# Patient Record
Sex: Female | Born: 1935 | Race: White | Hispanic: No | Smoking: Current every day smoker
Health system: Southern US, Community
[De-identification: ages and names within clinical notes are randomized; demographics above are authoritative.]

## PROBLEM LIST (undated history)

## (undated) DIAGNOSIS — J449 Chronic obstructive pulmonary disease, unspecified: Secondary | ICD-10-CM

## (undated) DIAGNOSIS — Z9981 Dependence on supplemental oxygen: Secondary | ICD-10-CM

## (undated) DIAGNOSIS — J9610 Chronic respiratory failure, unspecified whether with hypoxia or hypercapnia: Secondary | ICD-10-CM

## (undated) DIAGNOSIS — M1990 Unspecified osteoarthritis, unspecified site: Secondary | ICD-10-CM

## (undated) DIAGNOSIS — G8929 Other chronic pain: Secondary | ICD-10-CM

## (undated) DIAGNOSIS — E785 Hyperlipidemia, unspecified: Secondary | ICD-10-CM

## (undated) DIAGNOSIS — M797 Fibromyalgia: Secondary | ICD-10-CM

## (undated) DIAGNOSIS — F419 Anxiety disorder, unspecified: Secondary | ICD-10-CM

## (undated) DIAGNOSIS — D3500 Benign neoplasm of unspecified adrenal gland: Secondary | ICD-10-CM

## (undated) DIAGNOSIS — N390 Urinary tract infection, site not specified: Secondary | ICD-10-CM

## (undated) DIAGNOSIS — I972 Postmastectomy lymphedema syndrome: Secondary | ICD-10-CM

## (undated) DIAGNOSIS — C50919 Malignant neoplasm of unspecified site of unspecified female breast: Secondary | ICD-10-CM

## (undated) DIAGNOSIS — F32A Depression, unspecified: Secondary | ICD-10-CM

## (undated) DIAGNOSIS — M549 Dorsalgia, unspecified: Secondary | ICD-10-CM

## (undated) DIAGNOSIS — K219 Gastro-esophageal reflux disease without esophagitis: Secondary | ICD-10-CM

## (undated) DIAGNOSIS — I2510 Atherosclerotic heart disease of native coronary artery without angina pectoris: Secondary | ICD-10-CM

## (undated) DIAGNOSIS — IMO0001 Reserved for inherently not codable concepts without codable children: Secondary | ICD-10-CM

## (undated) DIAGNOSIS — I509 Heart failure, unspecified: Secondary | ICD-10-CM

## (undated) DIAGNOSIS — F329 Major depressive disorder, single episode, unspecified: Secondary | ICD-10-CM

## (undated) DIAGNOSIS — F17200 Nicotine dependence, unspecified, uncomplicated: Secondary | ICD-10-CM

## (undated) HISTORY — PX: BREAST BIOPSY: SHX20

## (undated) HISTORY — DX: Chronic obstructive pulmonary disease, unspecified: J44.9

## (undated) HISTORY — DX: Malignant neoplasm of unspecified site of unspecified female breast: C50.919

## (undated) HISTORY — PX: ABDOMINAL HYSTERECTOMY: SHX81

## (undated) HISTORY — PX: REFRACTIVE SURGERY: SHX103

## (undated) HISTORY — PX: BACK SURGERY: SHX140

## (undated) HISTORY — PX: COLONOSCOPY W/ BIOPSIES AND POLYPECTOMY: SHX1376

## (undated) HISTORY — PX: POSTERIOR LUMBAR FUSION: SHX6036

## (undated) HISTORY — PX: APPENDECTOMY: SHX54

## (undated) HISTORY — PX: SQUAMOUS CELL CARCINOMA EXCISION: SHX2433

---

## 1968-06-25 DIAGNOSIS — C50919 Malignant neoplasm of unspecified site of unspecified female breast: Secondary | ICD-10-CM

## 1968-06-25 HISTORY — PX: MASTECTOMY, RADICAL: SHX710

## 1968-06-25 HISTORY — DX: Malignant neoplasm of unspecified site of unspecified female breast: C50.919

## 1992-06-25 HISTORY — PX: NASAL SINUS SURGERY: SHX719

## 1997-11-29 ENCOUNTER — Ambulatory Visit (HOSPITAL_COMMUNITY): Admission: RE | Admit: 1997-11-29 | Discharge: 1997-11-29 | Payer: Self-pay | Admitting: *Deleted

## 1998-01-19 ENCOUNTER — Ambulatory Visit: Admission: RE | Admit: 1998-01-19 | Discharge: 1998-01-19 | Payer: Self-pay | Admitting: Rheumatology

## 1998-03-23 ENCOUNTER — Ambulatory Visit (HOSPITAL_COMMUNITY): Admission: RE | Admit: 1998-03-23 | Discharge: 1998-03-23 | Payer: Self-pay | Admitting: Orthopedic Surgery

## 1998-05-17 ENCOUNTER — Ambulatory Visit (HOSPITAL_COMMUNITY): Admission: RE | Admit: 1998-05-17 | Discharge: 1998-05-17 | Payer: Self-pay | Admitting: Gastroenterology

## 1998-06-08 ENCOUNTER — Encounter: Payer: Self-pay | Admitting: Neurosurgery

## 1998-06-10 ENCOUNTER — Inpatient Hospital Stay (HOSPITAL_COMMUNITY): Admission: RE | Admit: 1998-06-10 | Discharge: 1998-06-11 | Payer: Self-pay | Admitting: Neurosurgery

## 1999-01-17 ENCOUNTER — Inpatient Hospital Stay (HOSPITAL_COMMUNITY): Admission: AD | Admit: 1999-01-17 | Discharge: 1999-01-19 | Payer: Self-pay | Admitting: General Surgery

## 1999-04-08 ENCOUNTER — Encounter (HOSPITAL_BASED_OUTPATIENT_CLINIC_OR_DEPARTMENT_OTHER): Payer: Self-pay | Admitting: General Surgery

## 1999-04-08 ENCOUNTER — Inpatient Hospital Stay (HOSPITAL_COMMUNITY): Admission: EM | Admit: 1999-04-08 | Discharge: 1999-04-13 | Payer: Self-pay | Admitting: Emergency Medicine

## 1999-07-19 ENCOUNTER — Encounter: Payer: Self-pay | Admitting: Orthopedic Surgery

## 1999-07-19 ENCOUNTER — Ambulatory Visit (HOSPITAL_COMMUNITY): Admission: RE | Admit: 1999-07-19 | Discharge: 1999-07-19 | Payer: Self-pay | Admitting: Orthopedic Surgery

## 1999-07-20 ENCOUNTER — Inpatient Hospital Stay (HOSPITAL_COMMUNITY): Admission: AD | Admit: 1999-07-20 | Discharge: 1999-07-25 | Payer: Self-pay | Admitting: Rheumatology

## 1999-07-20 ENCOUNTER — Encounter: Payer: Self-pay | Admitting: Rheumatology

## 1999-10-11 ENCOUNTER — Encounter: Payer: Self-pay | Admitting: Rheumatology

## 1999-10-11 ENCOUNTER — Inpatient Hospital Stay (HOSPITAL_COMMUNITY): Admission: EM | Admit: 1999-10-11 | Discharge: 1999-10-14 | Payer: Self-pay | Admitting: Emergency Medicine

## 2000-02-20 ENCOUNTER — Encounter: Admission: RE | Admit: 2000-02-20 | Discharge: 2000-02-20 | Payer: Self-pay | Admitting: Internal Medicine

## 2000-02-20 ENCOUNTER — Encounter: Payer: Self-pay | Admitting: Internal Medicine

## 2000-03-29 ENCOUNTER — Encounter: Payer: Self-pay | Admitting: Rheumatology

## 2000-03-29 ENCOUNTER — Encounter: Admission: RE | Admit: 2000-03-29 | Discharge: 2000-03-29 | Payer: Self-pay | Admitting: Rheumatology

## 2000-10-04 ENCOUNTER — Encounter: Admission: RE | Admit: 2000-10-04 | Discharge: 2000-10-04 | Payer: Self-pay | Admitting: Rheumatology

## 2000-10-04 ENCOUNTER — Encounter: Payer: Self-pay | Admitting: Rheumatology

## 2000-11-05 ENCOUNTER — Ambulatory Visit (HOSPITAL_COMMUNITY): Admission: RE | Admit: 2000-11-05 | Discharge: 2000-11-05 | Payer: Self-pay | Admitting: Gastroenterology

## 2001-02-07 ENCOUNTER — Other Ambulatory Visit: Admission: RE | Admit: 2001-02-07 | Discharge: 2001-02-07 | Payer: Self-pay | Admitting: Rheumatology

## 2001-04-18 ENCOUNTER — Encounter: Admission: RE | Admit: 2001-04-18 | Discharge: 2001-04-18 | Payer: Self-pay | Admitting: Rheumatology

## 2001-04-18 ENCOUNTER — Encounter: Payer: Self-pay | Admitting: Rheumatology

## 2001-10-23 ENCOUNTER — Observation Stay (HOSPITAL_COMMUNITY): Admission: RE | Admit: 2001-10-23 | Discharge: 2001-10-24 | Payer: Self-pay | Admitting: Orthopedic Surgery

## 2002-05-08 ENCOUNTER — Encounter: Admission: RE | Admit: 2002-05-08 | Discharge: 2002-05-08 | Payer: Self-pay | Admitting: Rheumatology

## 2002-05-08 ENCOUNTER — Encounter: Payer: Self-pay | Admitting: Rheumatology

## 2002-12-25 ENCOUNTER — Ambulatory Visit (HOSPITAL_COMMUNITY): Admission: RE | Admit: 2002-12-25 | Discharge: 2002-12-25 | Payer: Self-pay | Admitting: Gastroenterology

## 2002-12-25 ENCOUNTER — Encounter (INDEPENDENT_AMBULATORY_CARE_PROVIDER_SITE_OTHER): Payer: Self-pay | Admitting: *Deleted

## 2003-02-17 ENCOUNTER — Encounter: Admission: RE | Admit: 2003-02-17 | Discharge: 2003-02-17 | Payer: Self-pay | Admitting: Rheumatology

## 2003-02-17 ENCOUNTER — Encounter: Payer: Self-pay | Admitting: Rheumatology

## 2003-04-13 ENCOUNTER — Encounter: Payer: Self-pay | Admitting: Internal Medicine

## 2003-04-13 ENCOUNTER — Inpatient Hospital Stay (HOSPITAL_COMMUNITY): Admission: AD | Admit: 2003-04-13 | Discharge: 2003-04-20 | Payer: Self-pay | Admitting: Internal Medicine

## 2003-04-15 ENCOUNTER — Encounter: Payer: Self-pay | Admitting: Internal Medicine

## 2003-04-16 ENCOUNTER — Encounter: Payer: Self-pay | Admitting: Internal Medicine

## 2003-04-19 ENCOUNTER — Encounter: Payer: Self-pay | Admitting: Specialist

## 2003-05-31 ENCOUNTER — Encounter: Admission: RE | Admit: 2003-05-31 | Discharge: 2003-05-31 | Payer: Self-pay | Admitting: Rheumatology

## 2003-06-14 ENCOUNTER — Encounter: Admission: RE | Admit: 2003-06-14 | Discharge: 2003-06-14 | Payer: Self-pay | Admitting: Rheumatology

## 2003-06-16 ENCOUNTER — Encounter: Admission: RE | Admit: 2003-06-16 | Discharge: 2003-06-16 | Payer: Self-pay | Admitting: Rheumatology

## 2004-06-23 ENCOUNTER — Encounter: Admission: RE | Admit: 2004-06-23 | Discharge: 2004-06-23 | Payer: Self-pay | Admitting: Rheumatology

## 2004-07-07 ENCOUNTER — Encounter: Admission: RE | Admit: 2004-07-07 | Discharge: 2004-07-07 | Payer: Self-pay | Admitting: Rheumatology

## 2004-08-25 ENCOUNTER — Ambulatory Visit (HOSPITAL_BASED_OUTPATIENT_CLINIC_OR_DEPARTMENT_OTHER): Admission: RE | Admit: 2004-08-25 | Discharge: 2004-08-25 | Payer: Self-pay | Admitting: Urology

## 2005-05-30 ENCOUNTER — Ambulatory Visit: Payer: Self-pay | Admitting: Internal Medicine

## 2005-05-30 ENCOUNTER — Inpatient Hospital Stay (HOSPITAL_COMMUNITY): Admission: AD | Admit: 2005-05-30 | Discharge: 2005-06-05 | Payer: Self-pay | Admitting: General Surgery

## 2005-10-19 ENCOUNTER — Encounter: Admission: RE | Admit: 2005-10-19 | Discharge: 2005-10-19 | Payer: Self-pay | Admitting: Rheumatology

## 2005-11-06 ENCOUNTER — Encounter: Admission: RE | Admit: 2005-11-06 | Discharge: 2005-11-06 | Payer: Self-pay | Admitting: Rheumatology

## 2006-03-01 ENCOUNTER — Encounter: Admission: RE | Admit: 2006-03-01 | Discharge: 2006-03-01 | Payer: Self-pay | Admitting: Rheumatology

## 2006-04-29 ENCOUNTER — Encounter: Admission: RE | Admit: 2006-04-29 | Discharge: 2006-04-29 | Payer: Self-pay | Admitting: Rheumatology

## 2006-05-07 ENCOUNTER — Ambulatory Visit (HOSPITAL_COMMUNITY): Admission: RE | Admit: 2006-05-07 | Discharge: 2006-05-07 | Payer: Self-pay | Admitting: General Surgery

## 2006-05-07 ENCOUNTER — Encounter: Admission: RE | Admit: 2006-05-07 | Discharge: 2006-05-07 | Payer: Self-pay | Admitting: General Surgery

## 2006-05-13 ENCOUNTER — Encounter: Admission: RE | Admit: 2006-05-13 | Discharge: 2006-05-13 | Payer: Self-pay | Admitting: General Surgery

## 2006-05-13 ENCOUNTER — Encounter (INDEPENDENT_AMBULATORY_CARE_PROVIDER_SITE_OTHER): Payer: Self-pay | Admitting: *Deleted

## 2006-12-06 ENCOUNTER — Encounter: Admission: RE | Admit: 2006-12-06 | Discharge: 2006-12-06 | Payer: Self-pay | Admitting: General Surgery

## 2007-06-02 ENCOUNTER — Encounter: Admission: RE | Admit: 2007-06-02 | Discharge: 2007-06-02 | Payer: Self-pay | Admitting: Rheumatology

## 2007-06-30 ENCOUNTER — Encounter: Admission: RE | Admit: 2007-06-30 | Discharge: 2007-06-30 | Payer: Self-pay | Admitting: General Surgery

## 2007-10-15 ENCOUNTER — Ambulatory Visit: Payer: Self-pay | Admitting: Vascular Surgery

## 2007-10-16 ENCOUNTER — Ambulatory Visit (HOSPITAL_COMMUNITY): Admission: RE | Admit: 2007-10-16 | Discharge: 2007-10-16 | Payer: Self-pay | Admitting: Orthopedic Surgery

## 2007-12-05 ENCOUNTER — Encounter: Admission: RE | Admit: 2007-12-05 | Discharge: 2007-12-05 | Payer: Self-pay | Admitting: Rheumatology

## 2009-02-18 ENCOUNTER — Encounter: Admission: RE | Admit: 2009-02-18 | Discharge: 2009-02-18 | Payer: Self-pay | Admitting: Rheumatology

## 2010-03-31 ENCOUNTER — Encounter: Admission: RE | Admit: 2010-03-31 | Discharge: 2010-03-31 | Payer: Self-pay | Admitting: Internal Medicine

## 2010-05-05 ENCOUNTER — Encounter: Admission: RE | Admit: 2010-05-05 | Discharge: 2010-05-05 | Payer: Self-pay | Admitting: Internal Medicine

## 2010-07-15 ENCOUNTER — Encounter: Payer: Self-pay | Admitting: Internal Medicine

## 2010-07-16 ENCOUNTER — Encounter: Payer: Self-pay | Admitting: Rheumatology

## 2010-11-10 NOTE — Discharge Summary (Signed)
[place]. [facility]  Patient:    Theresa Blackwell                        MRN: [id number] Adm. Date:  [id number] Disc. Date: [id number] Attending:  Genene Churn Dictator:   Demetria Pore. Coral Spikes, M.D. CC:         Marnee Spring. Wiliam Ke, M.D.             Ronald A. Darrelyn Hillock, M.D.             Adelene Amas. Williford, M.D.             Christella Hartigan, M.D./Alan Marin Comment, MD                           Discharge Summary  DISCHARGE DIAGNOSIS: 1.  Cellulitis, right arm (on background lymphedema, status post breast cancer     surgery). 2.  Status post thrombocytopenia, ? etiology; rule out infection. 3.  Chest x-ray atelectasis. 4.  Nausea, ? etiology; improved.  DISCHARGE MEDICATIONS: 1.  Keflex 500 mg q.i.d. for one week. 2.  Ritalin 20 mg 1 q.d. 3.  Doxepin 500 mg in the morning; Doxepin 200 mg at sleep (two 75 mg tablets     and one 50 mg tablet per patient). 4.  Xanax 0.5 mg 1/2 tablet t.i.d. as-needed for anxiety. 5.  Tylenol #3 one q.i.d. as-needed for back pain. 6.  Centrum Silver 1 q.d. 7.  Calcium 600 mg 1 b.i.d. 8.  Artificial Tears as-needed to eyes.  ACTIVITY:  Ambulate as tolerated.  DIET:  No restrictions.  SPECIAL INSTRUCTIONS:  Elevate right arm.  DISPOSITION:  Follow-up with Dr. Coral Spikes, appointment in one week; call for an appointment.  Also follow up with Dr. Darrelyn Hillock for her back pain.  HISTORY OF PRESENT ILLNESS:  The patient is a 77 year old right handed white female who was admitted with cellulitis of the right arm in a patient with lymphedema secondary to right mastectomy right arm for further evaluation and treatment. ee the dictated History and Physical exam for details.  LABORATORY DATA:  Blood cultures so far no growth (x three).  On July 20, 1999 WBC was 9100, hemoglobin 12.6, platelet count 170,000.  On July 22, 1999 WBC was 5500, hemoglobin 11.4, platelet count 116,000.  On July 24, 1999 platelet count was 167,000, WBC  4000, hemoglobin 11.2.  On July 20, 1999 80% segs.  On July 20, 1999 CMET remarkable for sodium 134,  albumin 3.4.  Chest x-ray July 20, 1999 showed mild patchy atelectasis at the left base with mild elevation of the left hemidiaphragm, otherwise no significant change from June 08, 1998.  EKG showed normal sinus rhythm with left axis deviation, nonspecific ST-T change; premature atrial contractions, resolved per  cardiologist.  HOSPITAL COURSE: #1 - CELLULITIS, RIGHT ARM:  The patient was admitted with cellulitis of the right arm clinically with erythema of the right arm and swelling (more than her lymphedema) and tenderness.  She was afebrile.  She was put on IV Ancef.  The patient was also seen by Dr. Wiliam Ke with cellulitis in the hospital.  The patient remained on IV Ancef through July 24, 1999.  On July 24, 1999 the erythema was not resolved, the swelling was decreased, and there was no pain in the right arm.  She remained with elevation of the right arm.  The patient was switched to  Keflex 500 mg q.i.d. and if remains afebrile will be discharged.  The patient was ambulatory.  #2 - THROMBOCYTOPENIA:  On admission the patient had thrombocytopenia as outlined above.  Repeat platelet count on July 24, 1999 was normal, ? thrombocytopenia secondary to infection (patients hemoglobin initially normal but dropped to 11 over 24 hours; would suspect secondary to infection).  #3 - CHEST X-RAY ATELECTASIS:  Chest x-ray done on admission showed atelectasis. There was no shortness of breath.  The patient did deep breathing and was asymptomatic.  The lungs were clear on July 24, 1999.  #4 - NAUSEA, ? ETIOLOGY:  The patient had nausea on July 23, 1999.  The patient states she was given Phenergan and was better and no recurrence.  Was not nauseated on July 24, 1999.  Abdomen benign.  Bowel sounds normoactive.  ? etiology.  #5 - BACK PAIN:   The patient  was in the process of seeing Dr. Darrelyn Hillock for back  pain at the time she was admitted for cellulitis.  Will have her follow up with Dr. Darrelyn Hillock as an outpatient.  As noted, on July 12, 1999 she was having low back pain.  X-rays of the back showed severe degenerative disk disease of her back. It is very difficult to tell if she really has a true fusion.  She has a history of breast cancer.  He is going to get a bone scan (bone scan in the computer focal  degenerative change of the lumbar spine L3-4, no metastatic disease).  The patient is going to follow up with Dr. Darrelyn Hillock.  She is on Tylenol #3 for her back pain.  PLAN ON DISCHARGE: 1.  Cellulitis.     a.  Elevate right arm.     b.  Keflex 500 mg q.i.d. for a week.     c.  To follow up with Dr. Coral Spikes in one week. 2.  Status post thrombocytopenia, ? secondary to infection.     a.  No further work-up. 3.  Atelectasis on chest x-ray, not clinically symptomatic.     a.  Patient ambulatory, no further treatment. 4.  Status post nausea, ? etiology.     a.  Nausea resolved, no treatment. 5.  Chronic low back pain, status post bilateral fusion L4 to sacrum.     a.  Tylenol #3 1 q.i.d.     b.  Follow-up with Dr. Darrelyn Hillock. 6.  Depression.     a.  Ritalin 20 mg q.d.     b.  Doxepin 200 mg evening and 50 mg morning.     c.  Xanax 1/2 of a 0.5 mg tablet t.i.d. p.r.n. anxiety.     d.  Follow-up with Dr. Jeanie Sewer.  DISCHARGE CONDITION:  Improved.DD:  07/24/99 TD:  07/24/99 Job: [id number]

## 2010-11-10 NOTE — H&P (Signed)
Theresa Blackwell, Theresa Blackwell                ACCOUNT NO.:  [id number]   MEDICAL RECORD NO.:  [id number]          PATIENT TYPE:   LOCATION:                                 FACILITY:   PHYSICIAN:  Rose Phi. Young, M.D.        DATE OF BIRTH:   DATE OF ADMISSION:  05/30/2005  DATE OF DISCHARGE:                                HISTORY & PHYSICAL   This 77 year old white female underwent a right radical mastectomy for stage  I carcinoma of the breast with postoperative chest wall radiation in the  early 1970s.  Subsequent to that she has really done well with some  lymphedema over the years.  She was last seen in our office with a  questionable lipoma in her left axilla back in May of 2006.   About a week or so ago after pricking her finger she developed more swelling  and some erythema of her right arm.  This turned into a severe cellulitis  and she was originally treated with Levaquin by mouth and then I saw her in  the office 48 hours ago.  She is quite erythematous from the upper arm to  the wrist and I switched to her Cephalexin 500 q.i.d. with no resolution.  I  am now admitting her now for IV antibiotics.   FAMILY HISTORY:  Father died of heart disease and a diseased gallbladder.  Mother died of older age.  Has one brother living and three who have died,  one of cancer.  She has two sisters living and three who have died.   CURRENT MEDICATIONS:  Doxepin, Tylenol No.3, Xanax, albuterol, and Allegra.   ALLERGIES:  ERYTHROMYCIN.   PAST SURGICAL HISTORY:  Radical mastectomy done some more than 30 years ago.   SOCIAL HISTORY:  Does not use tobacco or alcohol.   REVIEW OF SYSTEMS:  All 15 points were documented in the chart within normal  limits.   PHYSICAL FINDINGS:  GENERAL:  Theresa Blackwell is a very pleasant 77 year old  well-nourished, well-developed female in no distress.  She is oriented to  time, place, and date.  HEENT:  She is normocephalic.  EOMs are normal.  There is no  palpable,  cervical, or axillary nodes.  LUNGS:  Clear to percussion, auscultation.  HEART:  Normal sinus rhythm without murmurs.  LUNGS:  Clear to auscultation.  ABDOMEN:  Soft without masses, tenderness, or organomegaly.  SKIN:  With the extremities shows extreme erythema with the wrists to the  upper arm and does not extend to the chest wall.   IMPRESSION:  Significant cellulitis in a lymphedematous arm secondary to  previous breast cancer surgery.  She has failed as an outpatient on oral  antibiotics and we are admitting her for IV antibiotics.      Rose Phi. Maple Hudson, M.D.  Electronically Signed     PRY/MEDQ  D:  05/30/2005  T:  05/30/2005  Job:  [id number]

## 2010-11-10 NOTE — H&P (Signed)
NAME:  Theresa Blackwell, Theresa Blackwell                          ACCOUNT NO.:  [id number]   MEDICAL RECORD NO.:  [id number]                   PATIENT TYPE:  INP   LOCATION:  5150                                 FACILITY:  [facility]   PHYSICIAN:  Lennox Pippins, M.D.                 DATE OF BIRTH:  [birth year]   DATE OF ADMISSION:  04/13/2003  DATE OF DISCHARGE:                                HISTORY & PHYSICAL   CHIEF COMPLAINT:  Right arm infection.   HISTORY OF PRESENT ILLNESS:  Theresa Blackwell is a very pleasant 77 year old  white female patient of Dr. Katina Degree who presents to the office with a  right arm cellulitis. She states she has had lower arm swelling since she  was treated for cellulitis on February 18, 2003.  She was treated with  Levaquin at that time.  The redness resolved.  She did not however have  resolution of the pain in the lower right arm.  She also continued to have  some swelling.  In the last few days this has gotten much worse.  She  awakened this morning with chills and sweats and noticed that she had  redness all the way up her arm.  The patient is status post right breast  carcinoma with COMLA treatment and subsequent lymphedema in 1972.   REVIEW OF SYSTEMS:  GENERAL:  Fatigue.  She took her temperature this  morning but fever did not register on the thermometer.  HEENT:  No  complaints.  CHEST:  No cough or shortness of breath.  MUSCULOSKELETAL:  As  above, she has had some right lower back discomfort going straight through  to the right lower abdomen on and off for several days.   PAST MEDICAL HISTORY:  1. Breast cancer with a mastectomy in 1970.  2. Recurrent cellulitis right arm, following mastectomy.  3. Hyperlipidemia.  4. Hypercalcemia.  5. Smoking abuse.  6. Fibromyalgia.  7. Degenerative joint disease/chronic pain.  8. Depression, followed by Dr. Jeanie Sewer.   PAST SURGICAL HISTORY:  1. Mastectomy as above.  2. Schwannoma from neck, Dr. Elesa Hacker, December, 1999.  3.  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  4. Status post edematous polyp removed from the colon.  5. Sinus surgery, 1994, Dr. Nedra Hai.  6. Foot surgery, left.  7. Bilateral effusion of the back.  8. Total abdominal hysterectomy, BSO, appendectomy.   SOCIAL HISTORY:  She smokes one pack of cigarettes a day and has for 40  years.  No ETOH.   FAMILY HISTORY:  Colon cancer present in two brothers.  There is also a  family history for hypertension, kidney stone and arthritis.    MEDICATIONS:  1. Doxepin 50 mg one q.a.m., 200 mg h.s.  2. Tylenol #3 1-2 p.o. q.i.d. p.r.n.  3. Ritalin LA 30 mg one daily.  4. Multivitamin one daily.  5. Caltrate with  vitamin D one daily.   PHYSICAL EXAMINATION:  VITAL SIGNS:  Temperature 100.8, pulse 70,  respirations 20, blood pressure 140/88.  GENERAL:  She is pleasant, non toxic appearing.  HEENT:  Eyes - pupils are equal, round and reactive to light and  accommodation.  Ear, nose and throat unremarkable.  NECK:  No jugular venous distension.  No carotid bruits.  HEART:  Regular rate and rhythm without murmur.  CHEST:  Breath sounds are clear throughout. She has slight chest wall  redness that is warm to touch.  ABDOMEN:  Nontender.  MUSCULOSKELETAL:  No back tenderness.  There is right arm redness and  swelling all the way up the arm to just below the shoulder.  There is also  slight redness in the right chest, as above.  Excellent radial pulses.  NEUROLOGIC:  Nonfocal.   IMPRESSION:  Right arm cellulitis secondary to lymphedema from mastectomy.   PLAN:  Admit, intravenous antibiotics after blood cultures drawn.  Pain and  antiemetic medication.  Follow up per attending.        Tamera C. Kendal Hymen, M.D.    TCL/MEDQ  D:  04/13/2003  T:  04/13/2003  Job:  [id number]

## 2010-11-10 NOTE — Discharge Summary (Signed)
[place]. [facility]  Patient:    Theresa Blackwell, Theresa Blackwell                       MRN: [id number] Adm. Date:  [id number] Disc. Date: 10/14/99 Attending:  Genene Churn CC:         Marnee Spring. Wiliam Ke, M.D.             Ronald A. Darrelyn Hillock, M.D.             Adelene Amas. Williford, M.D.             Christella Hartigan, M.D.                           Discharge Summary  DISCHARGE DIAGNOSES:  Cellulitis, right arm.  DISCHARGE MEDICATIONS: 1. Keflex 500 mg four times a day for seven days (for a total of 10 days of    antibiotics). 2. Doxepin 50 mg in the morning and 200 mg at night. 3. Ritalin 20 mg b.i.d. 4. Xanax 0.5 mg one-half tablet t.i.d. p.r.n. anxiety. 5. Tylenol No. 3 one four times a day if needed for back pain. 6. Centrum Silver once a day. 7. Calcium 600 mg with vitamin D one twice a day. 8. Artificial Tears as needed.  DIET:  No restrictions.  ACTIVITY:  Ambulate as tolerated.  SPECIAL INSTRUCTIONS:  Outpatient will arrange physical therapy to learn to wrap right arm that has lymphedema.  FOLLOW-UP:  Follow up with Dr. Coral Spikes in 10 to 14 days.  Call for an appointment.  HISTORY OF PRESENT ILLNESS:  The patient is a 77 year old right-handed white female admitted with cellulitis of the right arm for further evaluation and treatment.  This is on a background of status post right radical mastectomy and cobalt therapy with subsequent lymphedema of the right arm and recurrent cellulitis episodes.  See dictated history and physical exam for details.  LABORATORY DATA:  Negative for interval change compared to July 20, 1999. CMET remarkable for sodium 146.  White count 9300, hemoglobin 13.6, platelet count 151,000, and 81% segs.  Blood culture no growth x 2 days (two blood cultures).  Urinalysis - 0 to 5 white and 0 to 5 red cells per high-power field.  HOSPITAL COURSE: #1 - CELLULITIS, RIGHT ARM:  Patient was admitted and put on IV antibiotics because of  cellulitis of the right arm clinically.  In a previous admission in January, she had failed outpatient treatment initially with Keflex. Initially, erythema persisted but eventually started to fade.  She had some discomfort in the right forearm which improved.  The patient remained afebrile.  Blood cultures no growth.  I elected to treat the patient with antibiotics for three days with IV Ancef.  If patient remains afebrile and erythema continues to improve, will switch to oral antibiotic on October 14, 1999, and discharge.  Consulted physical therapy with respect to lymphedema, right arm, for consideration of wrapping.  With cellulitis, it was too early to consider wrapping.  I will pursue this as an outpatient and discuss with patient.  PLAN ON DISCHARGE: 1. Keflex 500 mg four times a day for seven days for a total of 10 days of    antibiotics. 2. Elevate right arm. 3. Consider outpatient physical therapy to learn to wrap right arm Zella Ball    [place], [phone]).  #2 - BACK PAIN:  Continue Tylenol No 3. one q.i.d.  #3 -  DEPRESSION:  Continue Ritalin 20 mg b.i.d., Doxepin 50 mg in the morning and 200 mg in the evening, and Xanax one-half of 0.5 mg tablet t.i.d., and follow up with Dr. Jeanie Sewer.  #4 - DRY EYES:  Artificial Tears.  Follow up with Dr. ______  #5 - RIGHT UPPER LOBE LESION:  Follow up with Dr. Clydie Braun.  #6 - STATUS POST COLON POLYP:  Patient will need colonoscopy in 2001, arrange as an outpatient. DD:  10/13/99 TD:  10/15/99 Job: [id number]

## 2010-11-10 NOTE — Op Note (Signed)
[name], Theresa Blackwell                ACCOUNT NO.:  [id number]   MEDICAL RECORD NO.:  [id number]          PATIENT TYPE:  AMB   LOCATION:  [facility]                         [facility]AC[facility]   PHYSICIAN:  Lindaann Slough, M.D.  DATE OF BIRTH:  [birth year]   DATE OF PROCEDURE:  08/25/2004  DATE OF DISCHARGE:                                 OPERATIVE REPORT   PREOPERATIVE DIAGNOSIS:  Meatal stenosis.   POSTOPERATIVE DIAGNOSIS:  Meatal stenosis plus urethral caruncle   PROCEDURE PERFORMED:  Cystoscopy, urethral dilation.   SURGEON:  Danae Chen, M.D.   ANESTHESIA:  General.   DATE OF PROCEDURE:  08/25/2004   INDICATIONS:  The patient is a 77 year old female who has been complaining  of frequency, hesitancy, straining on urination.  She was treated with  antibiotics without any improvement.  She is scheduled today for cystoscopy  and meatal dilation.   Under general anesthesia, the patient was prepped and draped and placed in  the dorsal lithotomy position.  A #22 [place] cystoscope was inserted in the  bladder.  The bladder mucous is normal.  There is no stone or tumor in the  bladder.  The ureteral orifices are in normal position and shape with clear  efflux.  The bladder is trabeculated.  There is no evidence of submucosal  hemorrhage.  The bladder was then emptied and cystoscope was removed.  The  urethra was dilated with #32 [place].  There is a caruncle at the urethral  meatus   She tolerated the procedure well.      MN/MEDQ  D:  08/25/2004  T:  08/25/2004  Job:  [id number]   cc:   Demetria Pore. Coral Spikes, M.D.  [address]  [address]  [place]  [place] [address]  Fax: [phone]

## 2010-11-10 NOTE — Discharge Summary (Signed)
Theresa Blackwell, VENTURELLA                          ACCOUNT NO.:  [id number]   MEDICAL RECORD NO.:  [id number]                   PATIENT TYPE:  INP   LOCATION:  5150                                 FACILITY:  [facility]   PHYSICIAN:  Marcene Duos, M.D.         DATE OF BIRTH:  08-Feb-1931   DATE OF ADMISSION:  04/13/2003  DATE OF DISCHARGE:  04/20/2003                                 DISCHARGE SUMMARY   ADMISSION DIAGNOSES:  1. Right arm cellulitis.  2. Right arm lymphedema, status post right mastectomy in 1970.  3. Degenerative joint disease and chronic pain.  4. Hyperlipidemia.  5. Hypercalcemia.  6. Tobacco abuse.  7. Fibromyalgia.  8. Depression.   DISCHARGE DIAGNOSES:  1. Right arm cellulitis.  2. Right arm lymphedema, status post right mastectomy in 1970.  3. Degenerative joint disease and chronic pain.  4. Hyperlipidemia.  5. Hypercalcemia.  6. Tobacco abuse.  7. Fibromyalgia.  8. Depression.  9. Acute bronchitis with bronchospasm.  10.      Sclerotic bone lesions, ribs and scapula on the right, bone scan     negative for metastatic disease.  11.      Lower extremity muscle cramping.  12.      Bilateral impingement syndrome of shoulders.   PROCEDURES:  1. Upper extremity venous evaluation, no evidence of deep venous thrombosis,     April 15, 2003.  2. Bone scan, April 19, 2003.  3. Multiple x-rays of spine and shoulder.   HISTORY OF PRESENT ILLNESS:  Ms. Theresa Blackwell is a 77 year old female patient of  Dr. Demetria Pore. Levitin who presented to the office with recurrent right arm  increased swelling and erythema.  The patient had been treated as an  outpatient with Levaquin at the end of August; her symptomatology did  resolve at that time.  She continued to have swelling and in the few days  prior to admission, became much worse.  She awakened the morning of  admission with chills and sweats and the redness had increased all the way  up her arm.   PHYSICAL  EXAMINATION:  On exam, the patient was noted to have a temperature  of 100.8.  Lungs were clear.  Slight chest wall redness, warm to touch;  apparently, this extended from her arm and she had extensive right arm  redness and swelling to just below the shoulder.  Excellent radial pulses.   HOSPITAL COURSE:  PROBLEM #1 - RIGHT ARM CELLULITIS:  The patient was  admitted.  She was placed on IV cephazolin.  Blood cultures were drawn prior  to initiating antibiotics.  White count was 10.9 and platelets were a bit  low at 134,000, sed rate elevated at 54.  The patient was continued on  cephazolin until April 15, 2003, at which point she was changed to Zosyn,  as her progress was not felt to be adequate.  Doppler of the upper extremity  venous system was obtained which did not show a DVT.  Following the switch  to Zosyn, the patient's erythema significantly improved.  She was also  initiated on a mission sling to keep her arm elevated, which also improved  her progress as well.  The patient was switched to Augmentin on April 18, 2003 and continues to improve.  At time of discharge, her arm is almost to  baseline with very slight erythema and basically back to her baseline  lymphedema swelling.  Blood cultures showed no growth at time of discharge.   PROBLEM #2 - RIGHT SHOULDER PAIN:  Patient seen by Dr. Georges Lynch. Gioffre and  he was consulted to make sure there was no infectious process in the joint.  X-rays were obtained which showed some sclerotic bone lesions in ribs on the  right as well as her scapula.  Followup bone scan on April 19, 2003 did  not support these as metastatic disease.  She does have extensive  degenerative disease, especially in the lumbar spine, and lumbar spine films  were obtained for comparison views.  She was felt to have bilateral  impingement syndrome, to be followed up as an outpatient with Dr. Jeannetta Ellis  office.   PROBLEM #3 - ACUTE BRONCHITIS:  The patient  did have a chest x-ray with  question of streaky right lower lobe infiltrate felt to be possibly  atelectasis as well.  This, plus the fact that she was coughing up dark-  green sputum, initiated Zithromax to her antibiotic regimen and increased  pulmonary toilet.  She did have some wheezing also on exam and that resolved  with albuterol.  The patient will be discharged on albuterol and incentive  spirometry, as well as three more days of Zithromax.   PROBLEM #4 - THROMBOCYTOPENIA:  This was noted at her admission with  platelets at 134,000; at discharge, these had increased to 172,000; unknown  etiology.   PROBLEM #5 - LEG CRAMPS:  Repeat BMET obtained on April 19, 2003 to  evaluate this.  Her calcium and potassium and sodium were all within normal  limits.  We will send her home on some quinine 250 mg at bedtime to combat  this.  Discussed that she should be drinking lots of water.   DISCHARGE MEDICATIONS:  The patient was thus discharged on the following  medications:  1. Albuterol MDI two puffs every four hours as needed.  2. Doxepin 50 mg in the morning, 200 mg at bedtime.  3. Tylenol No. 3 one to two every six hours as needed.  4. Ritalin LA 30 mg p.o. daily.  5. Zithromax 500 mg daily for three more days.  6. Augmentin 875 mg twice daily for six more days.   ACTIVITY:  To keep her right arm elevated in mission sling.    FOLLOWUP:  To call Dr. Katina Degree office for followup in one week and Dr.  Jeannetta Ellis office for followup in one to two weeks.                                                Marcene Duos, M.D.    EMM/MEDQ  D:  04/20/2003  T:  04/20/2003  Job:  [id number]   cc:   Georges Lynch. Darrelyn Hillock, M.D.  [address]  [place]  [place] [address]  Fax: [phone]  Demetria Pore. Coral Spikes, M.D.  [address]  [address]  [place]  [place] [address]  Fax: [phone]

## 2010-11-10 NOTE — H&P (Signed)
[place]. [facility]  Patient:    Theresa Blackwell, Theresa Blackwell                       MRN: [id number] Adm. Date:  [id number] Attending:  Genene Churn CC:         Marnee Spring. Wiliam Ke, M.D.             Ronald A. Darrelyn Hillock, M.D.             Adelene Amas. Williford, M.D.             Christella Hartigan, M.D.                         History and Physical  HISTORY OF PRESENT ILLNESS:  The patient is a 77 year old right-handed white female admitted with cellulitis of the right arm, for further evaluation and treatment (IV antibiotic).  1. Cellulitis, right arm. 2. Right radical mastectomy, for breast cancer, with cobalt therapy, 1972; subsequent lymphedema, right arm, 1980. 3. Patient hospitalized for cellulitis, 1996; subsequent recurrence, cellulitis  right arm, treated both as an outpatient (Dr. Wiliam Ke) and inpatient (Dr. Wiliam Ke and Dr. Coral Spikes) with last episode of cellulitis, right arm - hospitalized 07/20/99 to 07/25/99, [facility], responding to IV Ancef.  About two to three days ago, the patient hit right upper arm against door, making a turn.  The patient woke up this morning on 10/11/99, with an itching right arm nd looked, and there was erythema of the entire right arm.  There was no pain. She has an enlarged right arm chronically from her lymphedema.  There is some pain hen she pressed, however, in her right upper arm.  She took a Keflex 500 mg about 11 p.m.  The patient called for refill of Keflex.  When I spoke with the patient and understood the issue, I told her I was uncomfortable calling in Keflex as an outpatient without seeing her, and asked her to come to the office, which she agreed to.  Took an Tylenol about 45 minutes before coming to the office.  In the office, temperature 97.2, pulse 96, blood pressure 100/70, left arm sitting. The right arm was erythematous around four fingerbreadths below the right acromion to the right wrist.  It was warm  but nontender.  I elected, with her diffuse erythema consistent with cellulitis, that I was going to treat her initially for IV antibiotic rather than oral antibiotic.  The patient agreed.  ALLERGIES:  ?NAPROSYN, ?ERYTHROMYCIN (can take Zee-pak).  IMMUNIZATIONS:  Flu shot, 2000.  Pneumovax shot, 1997.  MEDICATIONS:  Ritalin 20 mg twice a day, doxepin 50 mg in the morning and 200 mg in the evening (two 75 mg tablets and one 50 mg tablet), Xanax 1/2 of a 0.05 mg tablet t.i.d. p.r.n. anxiety (but has not used it recently), Tylenol No. 3 one q.i.d. p.r.n. low back pain, Centrum Silver once a day, calcium 600 mg with Vitamin-D twice a day, Artificial Tears q.1-2h.  The patient took one Keflex 500 mg on her own at about 11 a.m. today on 10/11/99.     SURGERY:  1. Right upper lobe lesion, ?etiology.     a. Status post squamous cell cancer of the mouth and suggested subsequent        yearly chest x-ray, November, 1998 - left side of neck knot.     b. Chest x-ray lesion, right upper lobe.  c. Subsequent CT scan of neck normal.     d. CT scan of right lung, right apical amorphous soft tissue density; referred        to [facility].     e. Bronchoscopy for right upper lobe mass with aberrant anatomy of the right        upper lobe, bronchial washings and tracheal biopsy done in November, 1998 -        clear.     f. Needle biopsy, patchy chronic inflammation with pathology report amorphous        eosinophilic material with patchy chronic inflammation; subsequently        followed by Dr. Clydie Braun, [facility] and followed up by CT scan - no        change in appearance.  (Last CT scan on my records, 10/20/98).  2. Status post right radical mastectomy for breast cancer with cobalt treatment     and subsequent lymphedema, 1972.  3. Status post squamous cell cancer, floor of mouth, 1991, Dr. Nedra Hai.  4. Status post adenomatous polyp of the colon, with family history of  colon cancer     in sister and two brothers.     a. Colonoscopy, 1997; no significant polyp.     b. Colonoscopy, 1999; diverticulosis.     c. Repeat colonoscopy in 2001 suggested.  5. Dry eyes - Artificial Tears.  Punctate closure, right and left eye, Dr.     ______, in 1998.  6. Status post sinus surgery in 1994, Dr. Nedra Hai.  7. Status post hemangioma of the labia, 1991.  8. Status post left hallux valgus surgery and left foot bunion surgery, Dr.     Chaney Malling, 1991.  9. Status post comedo, Dr. Mayford Knife, 1993. 10. Status post lesion, subcutaneous cyst on back, excised; Dr. Phylliss Bob, 1987. 11. Status post bilateral fusion, L4 to sacrum, using autologous bone graft from     left posterior iliac crest; Dr. Chaney Malling - still low back pain.     a. CT and myelogram in 1986 did not show evidence of spinal stenosis above he        fusion site.     b. Now seeing Dr. Darrelyn Hillock where she had bone scan done in 2001, showing        degenerative change of the lumbar spine, L3-4 and no metastatic disease.  Takes Tylenol No. 3 for her pain and followed by Dr. Darrelyn Hillock.  INJURIES:  Status post trauma top of left foot in 1986; x-ray done at [facility]; ?Normal.  MEDICAL:  1. Depression, Dr. Jeanie Sewer; Ritalin, doxepin, Xanax p.r.n.  2. Smoking abuse; states smokes 1/2-pack/day and has smoked for over 40 years.  Has smoked up to one pack/day.  3. Fibromyalgia/cervical degenerative joint disease, chronic pain; Tylenol No. 3     for pain p.r.n.  Also, low back pain - seeing Dr. Darrelyn Hillock.  4. Hyperlipidemia - diet.  5. Urge incontinence status post Levsinex; Dr. Elige Radon.  6. Ophthalmology - difficulty with vision, 1999.   Dr. ______ obtained MRI of the brain which showed chronic ischemic change involving small vessel territory, negative for orbital mass or other acute abnormality.  She saw Dr. Daphine Deutscher at [facility] who helped patients problem related to dry eye syndrome and possibly _______  conjunctivitis sic on Artificial Tears now.  Status post ?cauterization of tear ducts.   7. Status post left carpal/metacarpal joint osteoarthritis, Dr. Amanda Pea.  8. Status post chest pain, noncardiac.  a. December, 1990 - chest pain outpatient abnormal stress test and normal        coronary arteriogram.     b. Recurrent chest pain in 1996 with thallium study and evidence of inferior        ischemia; cardiac cath again normal.  9. GERD - irritable bowel; no abdominal pain now.     a. 1995 - ultrasound of the gallbladder, barium swallow and upper GI - normal.     b. Flexible sigmoidoscopy to 55 cm - diverticulosis; barium enema -        diverticulosis.     c. Colonoscopy as outlined above, for polyps. 10. Status post bright red blood in 1996; flexible sigmoidoscopy - internal     hemorrhoid. 11. Right hip pain, 1991; x-ray normal.  ?Ischeogluteal bursitis. 12. Status post vaginal discharge in 1990, treated empirically with Gyne-Lotrimin. 13. Status post bronchitis in the past. 14. Status post partial small bowel obstruction, 1994; resolution with conservative     treatment. 15. Glasses. 16. Status post Strep throat, 1989. 17. Status post suspected left septic olecranon bursitis, 1997; treated/resolved     with antibiotics. 18. Status post right chest pain in 1988; bone scan, blood tests and chest x-ray     were unremarkable except for some degenerative change, responding to     conservative treatment. 19. History of dyspareunia. 20. Status post abnormal liver test, 1985, with repeat normal; off pain medicine.     a. History of hepatitis;, ultrasound - no gallstone. 21. 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 22. History of peptic ulcer disease in 1981, with endoscopy showing ulcer.     a. Repeat endoscopy in December, 1981 - normal. 23. Status post GI bleed secondary to diverticulosis and hemorrhoid, April, 1981;     with lower GI bleed secondary to diverticulosis and  rectal polyp also noted at     that time. 24. History of hiatal hernia on upper GI.     a. Endoscopy in 1997, Schatzkis ring that was present with hiatal hernia. 25. Status post pneumonia. 26. Status post psychiatry for marital difficulty. 27. Status post hospitalization, December, 1990; lymphedema, right arm with     cellulitis and paronychia and possible ______ with drainage of ______. 28. Status post situational reaction; status post [facility] with [facility]     problems - Dr. Babs Sciara. 29. Status post left flank pain, Dr. Annabell Howells, 1993.     a. Repeat bone scan done; left flank pain resolved. 30. Status post chest x-ray, 1992; changes in right first rib.     a. Fluoroscopy of right first rib abnormal; mottled density     b. Bone scan unremarkable. 31. Status post maxillary sinusitis, bilaterally, 1997. 32. When patient hospitalized with cellulitis, right arm in 07/20/99; also had     thrombocytopenia that improved ?related to infection.  An episode of nausea.     a. Chest x-ray showed atelectasis.  SOCIAL HISTORY:  Widowed; retired.  Has two children - one son lives with her.  Smokes now 1/2-pack/day but has smoked for over 40 years.  Rarely drinks alcohol.  FAMILY HISTORY:  Family history of colon cancer in two brothers, sisters, aunts and uncles.  Uterine cancer.  Hypertension.  Kidney stone.  Arthritis - ?type.  REVIEW OF SYSTEMS:  All other systems negative.  PHYSICAL EXAMINATION:  GENERAL:  Alert, in no acute distress.  VITAL SIGNS:  Temperature 97.2, pulse 96, blood pressure 110/70 (left arm, sitting).  SKIN:  Erythema,  entire right arm, from about four fingerbreadths below the acromion to the wrist.  Warm right arm over erythema.  There was no tenderness involving the right arm.  Lymphedema of the right arm.  HEENT:  Eyes - conjunctivae pink, sclerae white, pupils equal, round and react o light.  Fundi normal.  ENT remarkable for dentures.  NECK:   Supple; thyroid not enlarged - no bruit, no adenopathy.  BREASTS:  Right mastectomy, no tenderness, redness or lesions in right breast or mastectomy scar.  Left breast nontender/no mass; chaperoned by nurse.  No axillary adenopathy.  LUNGS:  Clear to percussion/auscultation; no CVA tenderness.  HEART:  Regular rhythm, without murmur, gallop or rub.  ABDOMEN:  Benign without organomegaly or bruit.  PELVIC/RECTAL:  Exams not done; rectal exam was done during last admission (January, 2001).  EXTREMITIES:  Lymphedema of the right arm; see above (under Skin).  Pulses intact.  NEUROLOGIC:  No gross focal neurological finding.  Joint - no synovitis.  BACK:  Pain on palpation, right and left lower paralumbar muscle area.  IMPRESSION:  Cellulitis, right arm (in a patient with lymphedema, right arm - chronic, status post right radical mastectomy and cobalt treatment).  PLAN: 1. Admit, with IV antibiotics.  During her last admission, she failed outpatient    treatment initially. 2. Discussed with patient. 3. Blood culture (although patient took Keflex about 11 a.m.) and IV Ancef. 4. See Order Sheet for details. DD:  10/11/99 TD:  10/11/99 Job: [id number]

## 2010-11-10 NOTE — Op Note (Signed)
[facility]  Patient:    Theresa Blackwell, Theresa Blackwell Visit Number: [id number] MRN: [id number]          Service Type: SUR Location: 4W [id number] Attending Physician:  Skip Mayer Dictated by:   Georges Lynch Darrelyn Hillock, M.D. Proc. Date: 10/23/01 Admit Date:  10/23/2001 Discharge Date: 10/24/2001                             Operative Report  SURGEON:  Windy Fast A. Darrelyn Hillock, M.D.  ASSISTANT:  Nurse.  PREOPERATIVE DIAGNOSES: 1. Torn medial meniscus, right knee. 2. Torn lateral meniscus, right knee. 3. Degenerative arthritis, right knee.  POSTOPERATIVE DIAGNOSES: 1. Torn medial meniscus, right knee. 2. Torn lateral meniscus, right knee. 3. Degenerative arthritis, right knee.  OPERATION: 1. Diagnostic arthroscopy, right knee. 2. Medial meniscectomy, right knee. 3. Lateral meniscectomy, right knee. 4. Abrasion and chondroplasty of the medial femoral condyle, right knee.  DESCRIPTION OF PROCEDURE:  Under general anesthesia, a routine orthopedic prep and draping of the right lower extremity was carried out.  Small punctate incision was made in the suprapatellar pouch.  Inflow cannula was inserted and knee was distended with saline.  At this another small punctate was made in the anterolateral joint.  The arthroscope was entered.  A complete diagnostic laparoscopy was carried out.  The main problem was that she had a complete bucket-handle tear of the lateral meniscus. I introduced a shaver suction device in the medial approach and shaved out the meniscus, did a partial meniscectomy.  Following this, the cruciates were examined, and they were intact.  The medial meniscus was completely flopped over anteriorly.  She was detached anteriorly.  I went and utilized the larger shaver suction device, the 5.2, and did a medial meniscectomy back to the mid portion.  She had rather significant changes of the medial femoral condyle, and these were shaved back with the  shaver suction device.  Thoroughly irrigated out the knee, closed all three punctate incisions with 3-0 nylon suture.  I injected 30 cc of 0.5% Marcaine with epinephrine into the knee joint, and a sterile Neosporin bundle dressing was applied.  DISPOSITION:  I am definitely going to admit her.  It is very late in the day. She has had quite a bit of bleeding in her knee.  She has two significant tears of her menisci.  She is elderly, and she is going to need pain control. Dictated by:   Georges Lynch Darrelyn Hillock, M.D. Attending Physician:  Skip Mayer DD:  10/23/01 TD:  10/24/01 Job: [id number]

## 2010-11-10 NOTE — Discharge Summary (Signed)
Theresa Blackwell, Theresa Blackwell                ACCOUNT NO.:  [id number]   MEDICAL RECORD NO.:  [id number]          PATIENT TYPE:  INP   LOCATION:  5734                         FACILITY:  [facility]   PHYSICIAN:  Rose Phi. Maple Hudson, M.D.   DATE OF BIRTH:  May 09, 1931   DATE OF ADMISSION:  05/30/2005  DATE OF DISCHARGE:  06/05/2005                                 DISCHARGE SUMMARY   HISTORY OF PRESENT ILLNESS:  This 77 year old female had undergone a right  classical radical mastectomy with postoperative chest wall radiation in the  early 1970s at age 54.  She has never had a problem with her breast cancer  since then except she has had considerable problems with recurrent bouts of  cellulitis of her right arm with considerable edema.  She does have a sling,  which she would wear periodically.   About 5 or 6 days prior to her admission, she pricked her finger on the  right side.  She then developed a little more swelling and a little erythema  and was originally placed on Levaquin by her primary care physician.  I then  saw her in our office 3 days later at which time she had a considerable  cellulitis with significant fire-engine-red erythema, and she was switched  from the Levaquin to Cephalexin.  She did not improve over 48 hours and,  when seen in the office on the day of admission, I admitted her for IV  antibiotics.   LABORATORY DATA:  Admission laboratory data showed a normal hemogram,  urinalysis, chemical profile, chest x-ray and EKG.   PHYSICAL EXAMINATION:  The only significant physical findings had to do with  this fire-engine-red erythema of a rather large, swollen arm, especially  from the wrist to the upper part of the upper arm.  There was no evidence of  lymphadenopathy, and no evidence of recurrence of her breast cancer.   COURSE IN HOSPITAL:  She was seen in consultation by the Infectious Disease  people, who placed her on IV Ancef because they thought this was strep  cellulitis.  She  had a dramatic improvement overnight with resolution of the  erythema, but not the edema.  It has taken 5 or 6 days now to get most of  the erythema gone, with some desquamation of the skin and some wrinkling of  the skin as the edema has begun to resolve.   She has been converted from IV Ancef to oral cephalexin and is being  discharged to be followed as an outpatient.   FINAL DIAGNOSES:  Strep cellulitis of the right arm.   OPERATIONS:  None.   COMPLICATIONS:  None.   CONDITION ON DISCHARGE:  Improved.   DISCHARGE MEDICATIONS:  Cephalexin 500 p.o. t.i.d., and she will be wearing  the sling at home.   FOLLOWUP:  She is to be seen in the office in 1 week.      Rose Phi. Maple Hudson, M.D.  Electronically Signed     PRY/MEDQ  D:  06/05/2005  T:  06/05/2005  Job:  [id number]

## 2011-03-29 ENCOUNTER — Other Ambulatory Visit: Payer: Self-pay | Admitting: Internal Medicine

## 2011-03-29 DIAGNOSIS — IMO0002 Reserved for concepts with insufficient information to code with codable children: Secondary | ICD-10-CM

## 2011-03-29 DIAGNOSIS — M549 Dorsalgia, unspecified: Secondary | ICD-10-CM

## 2011-03-29 DIAGNOSIS — Z9889 Other specified postprocedural states: Secondary | ICD-10-CM

## 2011-04-02 ENCOUNTER — Ambulatory Visit
Admission: RE | Admit: 2011-04-02 | Discharge: 2011-04-02 | Disposition: A | Discharge status: Home / Self Care | Payer: Medicare Other | Source: Ambulatory Visit | Attending: Internal Medicine | Admitting: Internal Medicine

## 2011-04-02 DIAGNOSIS — M549 Dorsalgia, unspecified: Secondary | ICD-10-CM

## 2011-04-02 DIAGNOSIS — IMO0002 Reserved for concepts with insufficient information to code with codable children: Secondary | ICD-10-CM

## 2011-04-02 DIAGNOSIS — Z9889 Other specified postprocedural states: Secondary | ICD-10-CM

## 2011-04-02 MED ORDER — METHYLPREDNISOLONE ACETATE 40 MG/ML INJ SUSP (RADIOLOG
120.0000 mg | Freq: Once | INTRAMUSCULAR | Status: AC
  Administered 2011-04-02: 120 mg via EPIDURAL

## 2011-04-02 MED ORDER — IOHEXOL 180 MG/ML  SOLN
1.0000 mL | Freq: Once | INTRAMUSCULAR | Status: AC | PRN
  Administered 2011-04-02: 1 mL via EPIDURAL

## 2011-04-30 ENCOUNTER — Other Ambulatory Visit: Payer: Self-pay | Admitting: Internal Medicine

## 2011-04-30 DIAGNOSIS — Z853 Personal history of malignant neoplasm of breast: Secondary | ICD-10-CM

## 2011-04-30 DIAGNOSIS — Z9011 Acquired absence of right breast and nipple: Secondary | ICD-10-CM

## 2011-05-02 ENCOUNTER — Ambulatory Visit
Admission: RE | Admit: 2011-05-02 | Discharge: 2011-05-02 | Disposition: A | Discharge status: Home / Self Care | Payer: Medicare Other | Source: Ambulatory Visit | Attending: Internal Medicine | Admitting: Internal Medicine

## 2011-05-02 MED ORDER — IOHEXOL 300 MG/ML  SOLN
100.0000 mL | Freq: Once | INTRAMUSCULAR | Status: AC | PRN
  Administered 2011-05-02: 100 mL via INTRAVENOUS

## 2011-05-18 ENCOUNTER — Ambulatory Visit
Admission: RE | Admit: 2011-05-18 | Discharge: 2011-05-18 | Disposition: A | Discharge status: Home / Self Care | Payer: Medicare Other | Source: Ambulatory Visit | Attending: Internal Medicine | Admitting: Internal Medicine

## 2011-05-18 DIAGNOSIS — Z853 Personal history of malignant neoplasm of breast: Secondary | ICD-10-CM

## 2011-05-18 DIAGNOSIS — Z9011 Acquired absence of right breast and nipple: Secondary | ICD-10-CM

## 2011-06-05 ENCOUNTER — Inpatient Hospital Stay (HOSPITAL_COMMUNITY)
Admission: EM | Admit: 2011-06-05 | Discharge: 2011-06-11 | DRG: 689 | Disposition: A | Discharge status: Home Health | Payer: Medicare Other | Attending: Internal Medicine | Admitting: Internal Medicine

## 2011-06-05 ENCOUNTER — Emergency Department (HOSPITAL_COMMUNITY): Payer: Medicare Other

## 2011-06-05 ENCOUNTER — Encounter: Payer: Self-pay | Admitting: *Deleted

## 2011-06-05 ENCOUNTER — Other Ambulatory Visit: Payer: Self-pay

## 2011-06-05 DIAGNOSIS — E876 Hypokalemia: Secondary | ICD-10-CM | POA: Diagnosis present

## 2011-06-05 DIAGNOSIS — F3289 Other specified depressive episodes: Secondary | ICD-10-CM | POA: Diagnosis present

## 2011-06-05 DIAGNOSIS — R51 Headache: Secondary | ICD-10-CM | POA: Diagnosis present

## 2011-06-05 DIAGNOSIS — I2510 Atherosclerotic heart disease of native coronary artery without angina pectoris: Secondary | ICD-10-CM | POA: Diagnosis present

## 2011-06-05 DIAGNOSIS — N3090 Cystitis, unspecified without hematuria: Secondary | ICD-10-CM | POA: Diagnosis present

## 2011-06-05 DIAGNOSIS — I5031 Acute diastolic (congestive) heart failure: Secondary | ICD-10-CM | POA: Diagnosis present

## 2011-06-05 DIAGNOSIS — R109 Unspecified abdominal pain: Secondary | ICD-10-CM | POA: Diagnosis present

## 2011-06-05 DIAGNOSIS — M19019 Primary osteoarthritis, unspecified shoulder: Secondary | ICD-10-CM | POA: Diagnosis present

## 2011-06-05 DIAGNOSIS — F329 Major depressive disorder, single episode, unspecified: Secondary | ICD-10-CM | POA: Diagnosis present

## 2011-06-05 DIAGNOSIS — G589 Mononeuropathy, unspecified: Secondary | ICD-10-CM | POA: Diagnosis present

## 2011-06-05 DIAGNOSIS — F17200 Nicotine dependence, unspecified, uncomplicated: Secondary | ICD-10-CM | POA: Diagnosis present

## 2011-06-05 DIAGNOSIS — M1990 Unspecified osteoarthritis, unspecified site: Secondary | ICD-10-CM | POA: Diagnosis present

## 2011-06-05 DIAGNOSIS — R232 Flushing: Secondary | ICD-10-CM | POA: Diagnosis present

## 2011-06-05 DIAGNOSIS — N390 Urinary tract infection, site not specified: Principal | ICD-10-CM | POA: Diagnosis present

## 2011-06-05 DIAGNOSIS — M25519 Pain in unspecified shoulder: Secondary | ICD-10-CM | POA: Diagnosis not present

## 2011-06-05 DIAGNOSIS — J441 Chronic obstructive pulmonary disease with (acute) exacerbation: Secondary | ICD-10-CM

## 2011-06-05 DIAGNOSIS — J209 Acute bronchitis, unspecified: Secondary | ICD-10-CM | POA: Diagnosis present

## 2011-06-05 DIAGNOSIS — J440 Chronic obstructive pulmonary disease with acute lower respiratory infection: Secondary | ICD-10-CM | POA: Diagnosis not present

## 2011-06-05 DIAGNOSIS — Z853 Personal history of malignant neoplasm of breast: Secondary | ICD-10-CM

## 2011-06-05 DIAGNOSIS — I509 Heart failure, unspecified: Secondary | ICD-10-CM | POA: Diagnosis present

## 2011-06-05 DIAGNOSIS — R519 Headache, unspecified: Secondary | ICD-10-CM | POA: Diagnosis present

## 2011-06-05 DIAGNOSIS — R0602 Shortness of breath: Secondary | ICD-10-CM | POA: Diagnosis present

## 2011-06-05 HISTORY — DX: Anxiety disorder, unspecified: F41.9

## 2011-06-05 HISTORY — DX: Atherosclerotic heart disease of native coronary artery without angina pectoris: I25.10

## 2011-06-05 HISTORY — DX: Nicotine dependence, unspecified, uncomplicated: F17.200

## 2011-06-05 HISTORY — DX: Reserved for inherently not codable concepts without codable children: IMO0001

## 2011-06-05 LAB — URINALYSIS, ROUTINE W REFLEX MICROSCOPIC
Hgb urine dipstick: NEGATIVE
Nitrite: POSITIVE — AB
Protein, ur: NEGATIVE mg/dL
Specific Gravity, Urine: 1.024 (ref 1.005–1.030)
Urobilinogen, UA: 0.2 mg/dL (ref 0.0–1.0)

## 2011-06-05 LAB — CBC
MCH: 31.3 pg (ref 26.0–34.0)
MCHC: 32.6 g/dL (ref 30.0–36.0)
MCV: 96.1 fL (ref 78.0–100.0)
Platelets: 212 10*3/uL (ref 150–400)
RBC: 4.09 MIL/uL (ref 3.87–5.11)

## 2011-06-05 LAB — PRO B NATRIURETIC PEPTIDE: Pro B Natriuretic peptide (BNP): 6826 pg/mL — ABNORMAL HIGH (ref 0–450)

## 2011-06-05 LAB — BASIC METABOLIC PANEL
CO2: 26 mEq/L (ref 19–32)
Calcium: 9.6 mg/dL (ref 8.4–10.5)
Creatinine, Ser: 0.5 mg/dL (ref 0.50–1.10)
GFR calc non Af Amer: >90 mL/min (ref >90)
Glucose, Bld: 109 mg/dL — ABNORMAL HIGH (ref 70–99)
Sodium: 138 mEq/L (ref 135–145)

## 2011-06-05 LAB — TROPONIN I
Troponin I: <0.3 ng/mL (ref <0.30)
Troponin I: <0.3 ng/mL (ref <0.30)

## 2011-06-05 MED ORDER — SODIUM CHLORIDE 0.9 % IJ SOLN: 3.0000 mL | INTRAMUSCULAR | Status: DC | PRN

## 2011-06-05 MED ORDER — IOHEXOL 300 MG/ML  SOLN
100.0000 mL | Freq: Once | INTRAMUSCULAR | Status: AC | PRN
  Administered 2011-06-05: 100 mL via INTRAVENOUS

## 2011-06-05 MED ORDER — DEXTROSE 5 % IV SOLN
1.0000 g | Freq: Once | INTRAVENOUS | Status: AC
  Administered 2011-06-05 (×2): 1 g via INTRAVENOUS
  Filled 2011-06-05: qty 10

## 2011-06-05 MED ORDER — SODIUM CHLORIDE 0.9 % IV SOLN
250.0000 mL | INTRAVENOUS | Status: DC | PRN
  Administered 2011-06-05: 250 mL via INTRAVENOUS

## 2011-06-05 MED ORDER — ALBUTEROL SULFATE HFA 108 (90 BASE) MCG/ACT IN AERS
2.0000 | INHALATION_SPRAY | Freq: Once | RESPIRATORY_TRACT | Status: AC
  Administered 2011-06-05: 2 via RESPIRATORY_TRACT
  Filled 2011-06-05 (×2): qty 6.7

## 2011-06-05 MED ORDER — PREDNISONE 20 MG PO TABS
40.0000 mg | ORAL_TABLET | Freq: Once | ORAL | Status: AC
  Administered 2011-06-05: 40 mg via ORAL
  Filled 2011-06-05: qty 2

## 2011-06-05 MED ORDER — HYDROCODONE-ACETAMINOPHEN 5-325 MG PO TABS
1.0000 | ORAL_TABLET | Freq: Once | ORAL | Status: AC
  Administered 2011-06-05: 1 via ORAL
  Filled 2011-06-05 (×2): qty 1

## 2011-06-05 MED ORDER — ALBUTEROL SULFATE HFA 108 (90 BASE) MCG/ACT IN AERS
2.0000 | INHALATION_SPRAY | Freq: Once | RESPIRATORY_TRACT | Status: AC
  Administered 2011-06-05: 2 via RESPIRATORY_TRACT

## 2011-06-05 MED ORDER — ADENOSINE 6 MG/2ML IV SOLN
INTRAVENOUS | Status: AC
  Administered 2011-06-05: 16:00:00
  Filled 2011-06-05: qty 2

## 2011-06-05 MED ORDER — HYDROCODONE-ACETAMINOPHEN 5-325 MG PO TABS
1.0000 | ORAL_TABLET | Freq: Once | ORAL | Status: AC
  Administered 2011-06-05: 1 via ORAL
  Filled 2011-06-05: qty 1

## 2011-06-05 MED ORDER — SODIUM CHLORIDE 0.9 % IJ SOLN
3.0000 mL | Freq: Two times a day (BID) | INTRAMUSCULAR | Status: DC
  Administered 2011-06-06 – 2011-06-11 (×11): 3 mL via INTRAVENOUS

## 2011-06-05 NOTE — ED Notes (Signed)
The pt wanted to have 2 vicodin for her headache.  She is not happy that she received only tablet which was ordered

## 2011-06-05 NOTE — ED Provider Notes (Signed)
History     CSN: [id number] Arrival date & time: 06/05/2011  3:21 PM   First MD Initiated Contact with Patient 06/05/11 1524      Chief Complaint  Patient presents with  . Tachycardia    (Consider location/radiation/quality/duration/timing/severity/associated sxs/prior treatment) The history is provided by the patient.  patient is a 77 year old female with a history of COPD and remote breast cancer who presents with palpitations and dyspnea. Patient states the palpitations started early this morning. They've been constant and progressively worsening. She notes associated dyspnea with this. In addition to that she notes a vague chest discomfort since this morning. She has not had any lightheadedness or syncope but does have mild to moderate frontal headache. No vision change, extremity weakness, difficulty walking, confusion. Patient does note that her dyspnea has been progressively worsening over the last several months. She has mild nonproductive cough but that has been relatively stable. She denies fever.  She has been requiring an additional pillow to sleep and has had intermittent PND. She does not typically have these symptoms. She has also had mild bilateral lower extremity edema. No significant wheezing. Patient has noted these overall symptoms to be worse with exertion. Nothing has been noted to make things better.  Past Medical History  Diagnosis Date  . Coronary artery disease   . Cancer rt breast  . History of right mastectomy   . Anxiety   COPD Remote breast cancer   Past Surgical History  Procedure Date  . Breast surgery    Right mastectomy in 1970s History reviewed. No pertinent family history.  History  Substance Use Topics  . Smoking status: Not on file  . Smokeless tobacco: Not on file  . Alcohol Use:     OB History    Grav Para Term Preterm Abortions TAB SAB Ect Mult Living                  Review of Systems  Constitutional: Negative for fever and  chills.  HENT: Negative for facial swelling.   Eyes: Negative for visual disturbance.  Respiratory: Positive for cough and shortness of breath. Negative for chest tightness and wheezing.   Cardiovascular: Positive for chest pain, palpitations and leg swelling.  Gastrointestinal: Positive for abdominal pain (chronic for 1 year; stable). Negative for nausea, vomiting and diarrhea.  Genitourinary: Negative for difficulty urinating.  Skin: Negative for rash.  Neurological: Positive for headaches. Negative for weakness and numbness.  Psychiatric/Behavioral: Negative for behavioral problems and confusion.  All other systems reviewed and are negative.    Allergies  Review of patient's allergies indicates no known allergies.  Home Medications   Current Outpatient Rx  Name Route Sig Dispense Refill  . ACETAMINOPHEN 500 MG PO TABS Oral Take 500 mg by mouth every 6 (six) hours as needed. For pain     . ACETAMINOPHEN-CODEINE #3 300-30 MG PO TABS Oral Take 1 tablet by mouth 2 (two) times daily as needed. For pain     . ALBUTEROL SULFATE HFA 108 (90 BASE) MCG/ACT IN AERS Inhalation Inhale 2 puffs into the lungs every 4 (four) hours as needed. Shortness of breath     . CITALOPRAM HYDROBROMIDE 20 MG PO TABS Oral Take 20 mg by mouth daily.      . ERGOCALCIFEROL 50000 UNITS PO CAPS Oral Take 50,000 Units by mouth every 14 (fourteen) days.      Marland Kitchen FLUTICASONE PROPIONATE 50 MCG/ACT NA SUSP Nasal Place 2 sprays into the nose daily as needed.  For allergies     . OMEPRAZOLE 20 MG PO CPDR Oral Take 20 mg by mouth daily.      Marland Kitchen PREGABALIN 75 MG PO CAPS Oral Take 75 mg by mouth 2 (two) times daily.      Marland Kitchen TIOTROPIUM BROMIDE MONOHYDRATE 18 MCG IN CAPS Inhalation Place 18 mcg into inhaler and inhale daily.      Marland Kitchen TIZANIDINE HCL 4 MG PO CAPS Oral Take 4 mg by mouth at bedtime.      . TRAMADOL HCL 50 MG PO TABS Oral Take 100 mg by mouth every 6 (six) hours as needed. Maximum dose= 8 tablets per day For pain        BP 167/76  Pulse 94  Temp(Src) 98.4 F (36.9 C) (Oral)  Resp 24  Ht 5' 6.5" (1.689 m)  Wt 180 lb (81.647 kg)  BMI 28.62 kg/m2  SpO2 97%  Physical Exam  Nursing note and vitals reviewed. Constitutional: She is oriented to person, place, and time. She appears well-developed and well-nourished. No distress.  HENT:  Head: Normocephalic.  Nose: Nose normal.  Eyes: EOM are normal. Pupils are equal, round, and reactive to light.  Neck: Normal range of motion. Neck supple.       No bruit  Cardiovascular: Intact distal pulses.   No murmur heard.      Irregularly, irregular; tachycardic in low 100s  Pulmonary/Chest: Effort normal. No respiratory distress. She has no wheezes. She exhibits no tenderness.       Good air movement Mild bibasilar crackles  Abdominal: Soft. She exhibits no distension. There is no tenderness.  Musculoskeletal: Normal range of motion. She exhibits edema (mild equal BLE). She exhibits no tenderness.       No calf TTP  Neurological: She is alert and oriented to person, place, and time.       Normal strength  Skin: Skin is warm and dry. No rash noted. She is not diaphoretic.  Psychiatric: She has a normal mood and affect. Her behavior is normal. Thought content normal.    ED Course  Procedures (including critical care time)   Date: 06/05/2011  Rate: 98  Rhythm: sinus arrhythmia and premature atrial contractions (PAC)  QRS Axis: left  Intervals: PR prolonged  ST/T Wave abnormalities: nonspecific T wave changes  Conduction Disutrbances:first-degree A-V block   Narrative Interpretation:   Old EKG Reviewed: New first degree heart block, no significant change in previous nonspecific T wave changes, frequent PACs new  Repeat  Date: 06/05/2011  Rate: 70  Rhythm: sinus arrhythmia  QRS Axis: left  Intervals: normal  ST/T Wave abnormalities: nonspecific T wave changes  Conduction Disutrbances:none  Narrative Interpretation: No PAC compared to prior  Old  EKG Reviewed: unchanged     Labs Reviewed  BASIC METABOLIC PANEL - Abnormal; Notable for the following:    Potassium 3.2 (*)    Glucose, Bld 109 (*)    All other components within normal limits  URINALYSIS, ROUTINE W REFLEX MICROSCOPIC - Abnormal; Notable for the following:    APPearance CLOUDY (*)    Bilirubin Urine SMALL (*)    Nitrite POSITIVE (*)    Leukocytes, UA LARGE (*)    All other components within normal limits  URINE MICROSCOPIC-ADD ON - Abnormal; Notable for the following:    Squamous Epithelial / LPF MANY (*)    Bacteria, UA FEW (*)    Casts HYALINE CASTS (*)    All other components within normal limits  PRO B  NATRIURETIC PEPTIDE - Abnormal; Notable for the following:    Pro B Natriuretic peptide (BNP) 6826.0 (*)    All other components within normal limits  CBC  TROPONIN I  TROPONIN I  URINALYSIS, ROUTINE W REFLEX MICROSCOPIC  URINE CULTURE   Dg Chest 2 View  06/05/2011  *RADIOLOGY REPORT*  Clinical Data: Shortness of breath.  CHEST - 2 VIEW  Comparison: 11/09/2010  Findings: There is hyperinflation of the lungs compatible with COPD.  Heart is mildly enlarged.  Scarring in the right apex and lung bases, stable.  No acute opacities or effusions.  No acute bony abnormality.  IMPRESSION: COPD/chronic changes.  No active disease.  Original Report Authenticated By: Cyndie Chime, M.D.   Ct Angio Chest W/cm &/or Wo Cm  06/05/2011  *RADIOLOGY REPORT*  Clinical Data:  Shortness of breath.  CT ANGIOGRAPHY CHEST WITH CONTRAST  Technique:  Multidetector CT imaging of the chest was performed using the standard protocol during bolus administration of intravenous contrast.  Multiplanar CT image reconstructions including MIPs were obtained to evaluate the vascular anatomy.  Contrast: OMNIPAQUE IOHEXOL 300 MG/ML IV SOLN  Comparison:  06/14/2003  Findings:  No filling defects in the pulmonary arteries to suggest pulmonary emboli.  Prior right mastectomy.  Chronic density is  noted in the right apex, likely scarring, possibly from prior radiation.  Heart is mildly enlarged.  Aorta is normal caliber. Coronary artery calcifications are present. No mediastinal, hilar, or axillary adenopathy.  Scarring in the lung bases.  No pleural effusions.  Diffuse enlargement of the right adrenal gland, stable.  No acute findings in the upper abdomen.  No acute bony abnormality.  Review of the MIP images confirms the above findings.  IMPRESSION: No evidence of pulmonary embolus.  Chronic changes in the right apex, likely postradiation scarring. Prior right mastectomy.  Cardiomegaly, coronary artery disease.  Original Report Authenticated By: Cyndie Chime, M.D.     1. COPD exacerbation   2. UTI (urinary tract infection)       MDM   Patient presented with chest pain, dyspnea, palpitations. She was found to be tachycardic with EMS and on initial presentation. Initial rhythm was sinus arrhythmia frequent PACs.  Her exam she mild bibasilar crackles but chest x-ray was clear.  No ischemia noted on EKG and troponin negative. Other labs were mostly unremarkable. She had mild hypokalemia. She also has urine which is suggestive of possible urinary tract infection and she has dysuria. Will treat. Culture sent. With the initially negative cardiac workup, PE study was done. This was negative for PE or evidence of interstitial edema or infection. Etiology of patient's dyspnea is not entirely clear but appears to be most likely from COPD exacerbation. Prednisone given. Based on severity of patient's symptoms, decrease in functional status medicine was consult. They admitted the patient. Patient remained hemodynamically stable while in the emergency department. Repeat EKG had improvement in frequency of PACs.        Milus Glazier 06/06/11 0019

## 2011-06-05 NOTE — ED Notes (Signed)
The pt returned from c-t 

## 2011-06-05 NOTE — ED Notes (Signed)
The pt still complains of a headache.  Med given again

## 2011-06-05 NOTE — ED Notes (Signed)
Pt up to the br with assitance.  Requesting food to eat

## 2011-06-05 NOTE — ED Notes (Signed)
The pt arrived from home where she lives with her husband [place] ems brought her in.  Rapid heart rate that started this am with some neck pain.  enroute she was given adenocard 6mg  that did not convert her.  She was also geven nitro sl x2 and aspirin 324mg  .Marland Kitchen  Alert on arrival.  No previous history of this rhy.

## 2011-06-05 NOTE — ED Provider Notes (Signed)
I saw and evaluated the patient, reviewed the resident's note and I agree with the findings and plan. 77 year old female complains of new onset shortness of breath with mild chest discomfort.  She has also had a cough.  She usually has two-pillow orthopnea, and she has had increase it to 3 pillows recently.  She denies fevers, chills, she's had some nausea, but no vomiting.  She has never had CHF or an irregular heartbeat in the past.  She does smoke cigarettes.  On, examination.  She's got tachycardia, with rales bilaterally, and mild hypoxia.  We'll check chest x-ray, and laboratory testing, and depending on the findings.  Consult cardiology or internal medicine for further evaluation.  Nicholes Stairs, MD 06/05/11 1739

## 2011-06-05 NOTE — ED Notes (Signed)
Pr c/o a severe headache med order received.  Pt taken to xray before is could give pain med.  hearrt rate 93 and sl irregular

## 2011-06-05 NOTE — ED Notes (Signed)
Pt c/oa severe headache ?

## 2011-06-05 NOTE — ED Notes (Signed)
The pt is still c/o a headache. No other complaints.  Needs a c-t but c-t tech wants another iv placed

## 2011-06-05 NOTE — ED Notes (Signed)
3 20 angiocath inserted in the lt a-c by sabrina newsome rn for iv c-t

## 2011-06-05 NOTE — ED Notes (Signed)
Repeat EKG completed and given to Dr. Weldon Inches.

## 2011-06-05 NOTE — ED Notes (Signed)
Rt arm swollen with a compression band to that arm.  No stick bracelet to that ar,.  Rt mastectomy scar old in appearance

## 2011-06-05 NOTE — ED Notes (Signed)
The pt is c/o being too hot.  No complaints of her headache at present her husband remains at the bedside

## 2011-06-06 ENCOUNTER — Encounter (HOSPITAL_COMMUNITY): Payer: Self-pay | Admitting: Internal Medicine

## 2011-06-06 DIAGNOSIS — N3090 Cystitis, unspecified without hematuria: Secondary | ICD-10-CM | POA: Diagnosis present

## 2011-06-06 DIAGNOSIS — R0602 Shortness of breath: Secondary | ICD-10-CM | POA: Diagnosis present

## 2011-06-06 DIAGNOSIS — R232 Flushing: Secondary | ICD-10-CM | POA: Diagnosis present

## 2011-06-06 DIAGNOSIS — R109 Unspecified abdominal pain: Secondary | ICD-10-CM | POA: Diagnosis present

## 2011-06-06 LAB — BLOOD GAS, ARTERIAL
Acid-Base Excess: 1.2 mmol/L (ref 0.0–2.0)
Bicarbonate: 26 mEq/L — ABNORMAL HIGH (ref 20.0–24.0)
Drawn by: 35135
O2 Content: 28 L/min
O2 Saturation: 93.2 %
Patient temperature: 98.6
TCO2: 27.5 mmol/L (ref 0–100)
pCO2 arterial: 47.3 mmHg — ABNORMAL HIGH (ref 35.0–45.0)
pH, Arterial: 7.359 (ref 7.350–7.400)
pO2, Arterial: 72.5 mmHg — ABNORMAL LOW (ref 80.0–100.0)

## 2011-06-06 LAB — CARDIAC PANEL(CRET KIN+CKTOT+MB+TROPI)
CK, MB: 5.5 ng/mL — ABNORMAL HIGH (ref 0.3–4.0)
Relative Index: 3.3 — ABNORMAL HIGH (ref 0.0–2.5)
Troponin I: <0.3 ng/mL (ref <0.30)

## 2011-06-06 LAB — CREATININE, SERUM
Creatinine, Ser: 0.44 mg/dL — ABNORMAL LOW (ref 0.50–1.10)
GFR calc Af Amer: >90 mL/min (ref >90)
GFR calc non Af Amer: >90 mL/min (ref >90)

## 2011-06-06 LAB — CBC
HCT: 40.1 % (ref 36.0–46.0)
HCT: 40.9 % (ref 36.0–46.0)
Hemoglobin: 13.4 g/dL (ref 12.0–15.0)
Hemoglobin: 13.2 g/dL (ref 12.0–15.0)
MCH: 31.5 pg (ref 26.0–34.0)
MCH: 31.1 pg (ref 26.0–34.0)
MCHC: 33.4 g/dL (ref 30.0–36.0)
MCHC: 32.3 g/dL (ref 30.0–36.0)
MCV: 94.4 fL (ref 78.0–100.0)
MCV: 96.5 fL (ref 78.0–100.0)
Platelets: 199 10*3/uL (ref 150–400)
Platelets: 245 10*3/uL (ref 150–400)
RBC: 4.25 MIL/uL (ref 3.87–5.11)
RBC: 4.24 MIL/uL (ref 3.87–5.11)
RDW: 13.1 % (ref 11.5–15.5)
RDW: 13.2 % (ref 11.5–15.5)
WBC: 7.8 10*3/uL (ref 4.0–10.5)
WBC: 6.1 10*3/uL (ref 4.0–10.5)

## 2011-06-06 LAB — BASIC METABOLIC PANEL
BUN: 7 mg/dL (ref 6–23)
CO2: 26 mEq/L (ref 19–32)
Calcium: 9.7 mg/dL (ref 8.4–10.5)
Chloride: 101 mEq/L (ref 96–112)
Creatinine, Ser: 0.44 mg/dL — ABNORMAL LOW (ref 0.50–1.10)
GFR calc Af Amer: >90 mL/min (ref >90)
GFR calc non Af Amer: >90 mL/min (ref >90)
Glucose, Bld: 121 mg/dL — ABNORMAL HIGH (ref 70–99)
Potassium: 4 mEq/L (ref 3.5–5.1)
Sodium: 137 mEq/L (ref 135–145)

## 2011-06-06 LAB — CANCER ANTIGEN 19-9: CA 19-9: 5.2 U/mL — ABNORMAL LOW (ref <35.0)

## 2011-06-06 LAB — MAGNESIUM: Magnesium: 1 mg/dL — ABNORMAL LOW (ref 1.5–2.5)

## 2011-06-06 LAB — LIPASE, BLOOD: Lipase: 26 U/L (ref 11–59)

## 2011-06-06 LAB — CA 125: CA 125: 25.7 U/mL (ref 0.0–30.2)

## 2011-06-06 LAB — AMYLASE: Amylase: 17 U/L (ref 0–105)

## 2011-06-06 MED ORDER — ERGOCALCIFEROL 1.25 MG (50000 UT) PO CAPS
50000.0000 [IU] | ORAL_CAPSULE | ORAL | Status: DC
  Administered 2011-06-06: 50000 [IU] via ORAL
  Filled 2011-06-06: qty 1

## 2011-06-06 MED ORDER — POTASSIUM CHLORIDE CRYS ER 20 MEQ PO TBCR
20.0000 meq | EXTENDED_RELEASE_TABLET | Freq: Once | ORAL | Status: AC
  Administered 2011-06-06: 20 meq via ORAL
  Filled 2011-06-06: qty 1

## 2011-06-06 MED ORDER — TIZANIDINE HCL 4 MG PO CAPS
4.0000 mg | ORAL_CAPSULE | Freq: Every day | ORAL | Status: DC
  Administered 2011-06-06 – 2011-06-09 (×4): 4 mg via ORAL
  Filled 2011-06-06 (×7): qty 1

## 2011-06-06 MED ORDER — ONDANSETRON HCL 4 MG PO TABS
4.0000 mg | ORAL_TABLET | Freq: Four times a day (QID) | ORAL | Status: DC | PRN
  Administered 2011-06-10: 4 mg via ORAL
  Filled 2011-06-06: qty 1

## 2011-06-06 MED ORDER — SENNA 8.6 MG PO TABS
1.0000 | ORAL_TABLET | Freq: Two times a day (BID) | ORAL | Status: DC
  Administered 2011-06-06 – 2011-06-11 (×11): 8.6 mg via ORAL
  Filled 2011-06-06 (×12): qty 1

## 2011-06-06 MED ORDER — CITALOPRAM HYDROBROMIDE 20 MG PO TABS
20.0000 mg | ORAL_TABLET | Freq: Every day | ORAL | Status: DC
  Administered 2011-06-06 – 2011-06-11 (×6): 20 mg via ORAL
  Filled 2011-06-06 (×6): qty 1

## 2011-06-06 MED ORDER — ONDANSETRON HCL 4 MG/2ML IJ SOLN
4.0000 mg | Freq: Four times a day (QID) | INTRAMUSCULAR | Status: DC | PRN
  Administered 2011-06-09 – 2011-06-10 (×2): 4 mg via INTRAVENOUS
  Filled 2011-06-06 (×2): qty 2

## 2011-06-06 MED ORDER — BIOTENE DRY MOUTH MT LIQD
15.0000 mL | Freq: Two times a day (BID) | OROMUCOSAL | Status: DC
  Administered 2011-06-06 – 2011-06-11 (×10): 15 mL via OROMUCOSAL

## 2011-06-06 MED ORDER — ALUM & MAG HYDROXIDE-SIMETH 200-200-20 MG/5ML PO SUSP: 30.0000 mL | Freq: Four times a day (QID) | ORAL | Status: DC | PRN

## 2011-06-06 MED ORDER — HYDRALAZINE HCL 20 MG/ML IJ SOLN: 10.0000 mg | INTRAMUSCULAR | Status: DC | PRN

## 2011-06-06 MED ORDER — PANTOPRAZOLE SODIUM 40 MG PO TBEC
40.0000 mg | DELAYED_RELEASE_TABLET | Freq: Every day | ORAL | Status: DC
  Administered 2011-06-06 – 2011-06-11 (×6): 40 mg via ORAL
  Filled 2011-06-06 (×4): qty 1

## 2011-06-06 MED ORDER — TRAMADOL HCL 50 MG PO TABS
100.0000 mg | ORAL_TABLET | Freq: Four times a day (QID) | ORAL | Status: DC | PRN
  Administered 2011-06-06 – 2011-06-10 (×6): 100 mg via ORAL
  Filled 2011-06-06: qty 2
  Filled 2011-06-06: qty 1
  Filled 2011-06-06: qty 2
  Filled 2011-06-06: qty 1
  Filled 2011-06-06 (×2): qty 2
  Filled 2011-06-06 (×2): qty 1

## 2011-06-06 MED ORDER — LEVALBUTEROL HCL 0.63 MG/3ML IN NEBU
0.6300 mg | INHALATION_SOLUTION | RESPIRATORY_TRACT | Status: DC | PRN
  Administered 2011-06-06: 0.63 mg via RESPIRATORY_TRACT
  Filled 2011-06-06: qty 3

## 2011-06-06 MED ORDER — ENOXAPARIN SODIUM 40 MG/0.4ML ~~LOC~~ SOLN
40.0000 mg | SUBCUTANEOUS | Status: DC
  Administered 2011-06-07 – 2011-06-11 (×5): 40 mg via SUBCUTANEOUS
  Filled 2011-06-06 (×6): qty 0.4

## 2011-06-06 MED ORDER — POTASSIUM CHLORIDE 10 MEQ/100ML IV SOLN
10.0000 meq | INTRAVENOUS | Status: DC
  Administered 2011-06-06 (×2): 10 meq via INTRAVENOUS
  Filled 2011-06-06 (×4): qty 100

## 2011-06-06 MED ORDER — HYDROCODONE-ACETAMINOPHEN 5-325 MG PO TABS
1.0000 | ORAL_TABLET | ORAL | Status: DC | PRN
  Administered 2011-06-06 (×2): 2 via ORAL
  Administered 2011-06-08: 1 via ORAL
  Administered 2011-06-08: 2 via ORAL
  Administered 2011-06-08: 1 via ORAL
  Administered 2011-06-09: 2 via ORAL
  Administered 2011-06-09 – 2011-06-10 (×3): 1 via ORAL
  Administered 2011-06-10: 2 via ORAL
  Filled 2011-06-06 (×2): qty 2
  Filled 2011-06-06 (×2): qty 1
  Filled 2011-06-06: qty 2
  Filled 2011-06-06: qty 1
  Filled 2011-06-06 (×4): qty 2
  Filled 2011-06-06: qty 1

## 2011-06-06 MED ORDER — ENOXAPARIN SODIUM 30 MG/0.3ML ~~LOC~~ SOLN
30.0000 mg | SUBCUTANEOUS | Status: DC
  Administered 2011-06-06: 30 mg via SUBCUTANEOUS
  Filled 2011-06-06 (×2): qty 0.3

## 2011-06-06 MED ORDER — TIOTROPIUM BROMIDE MONOHYDRATE 18 MCG IN CAPS
18.0000 ug | ORAL_CAPSULE | Freq: Every day | RESPIRATORY_TRACT | Status: DC
  Administered 2011-06-06 – 2011-06-11 (×6): 18 ug via RESPIRATORY_TRACT
  Filled 2011-06-06 (×2): qty 5

## 2011-06-06 MED ORDER — FUROSEMIDE 10 MG/ML IJ SOLN
40.0000 mg | Freq: Two times a day (BID) | INTRAMUSCULAR | Status: DC
  Administered 2011-06-06 – 2011-06-08 (×4): 40 mg via INTRAVENOUS
  Filled 2011-06-06 (×6): qty 4

## 2011-06-06 MED ORDER — MORPHINE SULFATE 4 MG/ML IJ SOLN
4.0000 mg | INTRAMUSCULAR | Status: AC
  Administered 2011-06-06: 4 mg via INTRAVENOUS
  Filled 2011-06-06: qty 1

## 2011-06-06 MED ORDER — ACETAMINOPHEN 500 MG PO TABS
500.0000 mg | ORAL_TABLET | Freq: Four times a day (QID) | ORAL | Status: DC | PRN
  Filled 2011-06-06: qty 1

## 2011-06-06 MED ORDER — DEXTROSE 5 % IV SOLN
1.0000 g | INTRAVENOUS | Status: DC
  Administered 2011-06-06 – 2011-06-11 (×6): 1 g via INTRAVENOUS
  Filled 2011-06-06 (×6): qty 10

## 2011-06-06 MED ORDER — IPRATROPIUM BROMIDE 0.02 % IN SOLN
0.5000 mg | Freq: Four times a day (QID) | RESPIRATORY_TRACT | Status: DC
  Administered 2011-06-06 – 2011-06-09 (×13): 0.5 mg via RESPIRATORY_TRACT
  Filled 2011-06-06 (×13): qty 2.5

## 2011-06-06 MED ORDER — MORPHINE SULFATE 2 MG/ML IJ SOLN
4.0000 mg | INTRAMUSCULAR | Status: DC | PRN
  Administered 2011-06-06: 4 mg via INTRAVENOUS
  Filled 2011-06-06 (×2): qty 1

## 2011-06-06 MED ORDER — GUAIFENESIN ER 600 MG PO TB12
600.0000 mg | ORAL_TABLET | Freq: Two times a day (BID) | ORAL | Status: DC
  Administered 2011-06-06 – 2011-06-11 (×10): 600 mg via ORAL
  Filled 2011-06-06 (×13): qty 1

## 2011-06-06 MED ORDER — PREGABALIN 50 MG PO CAPS
75.0000 mg | ORAL_CAPSULE | Freq: Two times a day (BID) | ORAL | Status: DC
  Administered 2011-06-06 – 2011-06-11 (×11): 75 mg via ORAL
  Filled 2011-06-06: qty 3
  Filled 2011-06-06 (×3): qty 1
  Filled 2011-06-06: qty 3
  Filled 2011-06-06 (×2): qty 1
  Filled 2011-06-06: qty 3
  Filled 2011-06-06 (×3): qty 1
  Filled 2011-06-06: qty 3

## 2011-06-06 NOTE — Progress Notes (Signed)
I have seen interviewed and examined pt, will continue current management plan as per Dr Gasper Sells. Pt states still some sob but better - bnp noted to elevated, will obtain BNP, start on lasix, also obtain echo and cycle CEs. Still with some abd pain mostly upper abd, and in suprapubic area- await UA, start PPI, follow consider CT abd if not improving and current pending studies neg.

## 2011-06-06 NOTE — ED Provider Notes (Signed)
I saw and evaluated the patient, reviewed the resident's note and I agree with the findings and plan.  Icholas Irby P Artin Mceuen, MD 06/06/11 0937 

## 2011-06-06 NOTE — Progress Notes (Signed)
   CARE MANAGEMENT NOTE 06/06/2011  Patient:  Theresa Blackwell, Theresa Blackwell   Account Number:  [id number]  Date Initiated:  06/06/2011  Documentation initiated by:  Letha Cape  Subjective/Objective Assessment:   dx sob, abd pain, uti  admit - lives with spouse.     Action/Plan:   Anticipated DC Date:  06/10/2011   Anticipated DC Plan:  HOME/SELF CARE      DC Planning Services  CM consult      Choice offered to / List presented to:             Status of service:  In process, will continue to follow Medicare Important Message given?   (If response is "NO", the following Medicare IM given date fields will be blank) Date Medicare IM given:   Date Additional Medicare IM given:    Discharge Disposition:    Per UR Regulation:    Comments:  06/06/11 15:31 Letha Cape RN, BSN [phone] Patient lives with spouse, GI consulted. NCM will continue to follow for dc needs.

## 2011-06-06 NOTE — H&P (Signed)
Hospital Admission Note Date: 06/06/2011  Patient name: Theresa Blackwell Medical record number: [id number] Date of birth: [birth year] Age: 77 y.o. Gender: female PCP: Helmut Muster, MD  Chief Complaint: severe shortness of breath  History of Present Illness: Patient suddenly couldn't breath this am while sitting on the couch.  In the past she has had theses attacks secondary to COPD but never this bad.  She has had trouble with severe abdominal pain, hot flashes and just feeling "sick" for some time.  CT has been negative.  She is scheduled to have a colonoscopy on Dec 28, but she continues to feel horrible.  Meds: Medications Prior to Admission  Medication Dose Route Frequency Provider Last Rate Last Dose  . sodium chloride 0.9 % injection 3 mL  3 mL Intravenous Q12H Mike Schinlever       And  . sodium chloride 0.9 % injection 3 mL  3 mL Intravenous PRN Milus Glazier       And  . 0.9 %  sodium chloride infusion  250 mL Intravenous PRN Mike Schinlever 20 mL/hr at 06/05/11 1614 250 mL at 06/05/11 1614  . adenosine (ADENOCARD) 6 MG/2ML injection           . albuterol (PROVENTIL HFA;VENTOLIN HFA) 108 (90 BASE) MCG/ACT inhaler 2 puff  2 puff Inhalation Once [employer]   2 puff at 06/05/11 2112  . albuterol (PROVENTIL HFA;VENTOLIN HFA) 108 (90 BASE) MCG/ACT inhaler 2 puff  2 puff Inhalation Once [employer]   2 puff at 06/05/11 2156  . cefTRIAXone (ROCEPHIN) 1 g in dextrose 5 % 50 mL IVPB  1 g Intravenous Once [employer]   1 g at 06/05/11 2155  . HYDROcodone-acetaminophen (NORCO) 5-325 MG per tablet 1 tablet  1 tablet Oral Once Milus Glazier   1 tablet at 06/05/11 1656  . HYDROcodone-acetaminophen (NORCO) 5-325 MG per tablet 1 tablet  1 tablet Oral Once Milus Glazier   1 tablet at 06/05/11 1900  . iohexol (OMNIPAQUE) 300 MG/ML solution 100 mL  100 mL Intravenous Once PRN Medication Radiologist   100 mL at 06/05/11 1949  . predniSONE (DELTASONE) tablet 40 mg  40 mg Oral Once  [employer]   40 mg at 06/05/11 2202   No current outpatient prescriptions on file as of 06/06/2011.    Allergies: Review of patient's allergies indicates no known allergies. Past Medical History  Diagnosis Date  . Coronary artery disease   . Cancer rt breast  . History of right mastectomy   . Anxiety    Past Surgical History  Procedure Date  . Breast surgery    History reviewed. No pertinent family history. History   Social History  . Marital Status: Widowed    Spouse Name: N/A    Number of Children: N/A  . Years of Education: N/A   Occupational History  . Not on file.   Social History Main Topics  . Smoking status: Not on file  . Smokeless tobacco: Not on file  . Alcohol Use:   . Drug Use:   . Sexually Active:    Other Topics Concern  . Not on file   Social History Narrative  . No narrative on file    Review of Systems: Review of Systems  Constitutional: Positive for fever, chills, malaise/fatigue and diaphoresis.  HENT: Negative for ear pain, nosebleeds, congestion, sore throat and ear discharge.   Respiratory: Positive for shortness of breath and wheezing. Negative for cough, hemoptysis, sputum production and stridor.  Cardiovascular: Positive for chest pain.  Gastrointestinal: Positive for nausea, abdominal pain, diarrhea and blood in stool. Negative for vomiting.  Genitourinary: Positive for dysuria and frequency.  Musculoskeletal: Positive for myalgias and back pain.  Skin: Negative for itching and rash.  Neurological: Positive for weakness and headaches. Negative for focal weakness, seizures and loss of consciousness.  Psychiatric/Behavioral: Positive for depression and memory loss.     Filed Vitals:   06/05/11 1800 06/05/11 1830 06/05/11 2055 06/05/11 2204  BP: 134/92 141/62 150/81 167/76  Pulse: 86 88 84 94  Temp:    98.4 F (36.9 C)  TempSrc:    Oral  Resp: 28 28 21 24   Height:      Weight:      SpO2: 95% 95% 94% 97%    Physical  Exam: Physical Exam  Constitutional: She appears distressed.       Profusely diaphoretic.  Hair plastered to temples.  HENT:  Head: Normocephalic and atraumatic.  Eyes: Conjunctivae and EOM are normal. Pupils are equal, round, and reactive to light. Right eye exhibits no discharge. Left eye exhibits no discharge. No scleral icterus.  Neck: No JVD present. No tracheal deviation present. Thyromegaly present.  Cardiovascular: Normal rate, normal heart sounds and intact distal pulses.  Exam reveals no gallop and no friction rub.   No murmur heard. Pulmonary/Chest: Effort normal and breath sounds normal. No stridor. No respiratory distress. She has no wheezes. She has no rales. She exhibits no tenderness.  Abdominal: She exhibits distension. She exhibits no mass. There is tenderness. There is no rebound and no guarding.  Lymphadenopathy:    She has no cervical adenopathy.  Neurological:       Alert but agitated, cant sit still.  She says its because of her severe pain.  She sits up, then stands, then sits back down.  She is angry and lashes out easily.  Skin: She is diaphoretic. There is pallor.     Lab results: Results for orders placed during the hospital encounter of 06/05/11  CBC      Component Value Range   WBC 8.6  4.0 - 10.5 (K/uL)   RBC 4.09  3.87 - 5.11 (MIL/uL)   Hemoglobin 12.8  12.0 - 15.0 (g/dL)   HCT 40.9  81.1 - 91.4 (%)   MCV 96.1  78.0 - 100.0 (fL)   MCH 31.3  26.0 - 34.0 (pg)   MCHC 32.6  30.0 - 36.0 (g/dL)   RDW 78.2  95.6 - 21.3 (%)   Platelets 212  150 - 400 (K/uL)  BASIC METABOLIC PANEL      Component Value Range   Sodium 138  135 - 145 (mEq/L)   Potassium 3.2 (*) 3.5 - 5.1 (mEq/L)   Chloride 101  96 - 112 (mEq/L)   CO2 26  19 - 32 (mEq/L)   Glucose, Bld 109 (*) 70 - 99 (mg/dL)   BUN 9  6 - 23 (mg/dL)   Creatinine, Ser 0.86  0.50 - 1.10 (mg/dL)   Calcium 9.6  8.4 - 57.8 (mg/dL)   GFR calc non Af Amer >90  >90 (mL/min)   GFR calc Af Amer >90  >90 (mL/min)    TROPONIN I      Component Value Range   Troponin I <0.30  <0.30 (ng/mL)  URINALYSIS, ROUTINE W REFLEX MICROSCOPIC      Component Value Range   Color, Urine YELLOW  YELLOW    APPearance CLOUDY (*) CLEAR    Specific Gravity,  Urine 1.024  1.005 - 1.030    pH 6.5  5.0 - 8.0    Glucose, UA NEGATIVE  NEGATIVE (mg/dL)   Hgb urine dipstick NEGATIVE  NEGATIVE    Bilirubin Urine SMALL (*) NEGATIVE    Ketones, ur NEGATIVE  NEGATIVE (mg/dL)   Protein, ur NEGATIVE  NEGATIVE (mg/dL)   Urobilinogen, UA 0.2  0.0 - 1.0 (mg/dL)   Nitrite POSITIVE (*) NEGATIVE    Leukocytes, UA LARGE (*) NEGATIVE   URINE MICROSCOPIC-ADD ON      Component Value Range   Squamous Epithelial / LPF MANY (*) RARE    WBC, UA TOO NUMEROUS TO COUNT  <3 (WBC/hpf)   Bacteria, UA FEW (*) RARE    Casts HYALINE CASTS (*) NEGATIVE   TROPONIN I      Component Value Range   Troponin I <0.30  <0.30 (ng/mL)  PRO B NATRIURETIC PEPTIDE      Component Value Range   Pro B Natriuretic peptide (BNP) 6826.0 (*) 0 - 450 (pg/mL)   Imaging results:  Dg Chest 2 View  06/05/2011  *RADIOLOGY REPORT*  Clinical Data: Shortness of breath.  CHEST - 2 VIEW  Comparison: 11/09/2010  Findings: There is hyperinflation of the lungs compatible with COPD.  Heart is mildly enlarged.  Scarring in the right apex and lung bases, stable.  No acute opacities or effusions.  No acute bony abnormality.  IMPRESSION: COPD/chronic changes.  No active disease.  Original Report Authenticated By: Cyndie Chime, M.D.   Ct Angio Chest W/cm &/or Wo Cm  06/05/2011  *RADIOLOGY REPORT*  Clinical Data:  Shortness of breath.  CT ANGIOGRAPHY CHEST WITH CONTRAST  Technique:  Multidetector CT imaging of the chest was performed using the standard protocol during bolus administration of intravenous contrast.  Multiplanar CT image reconstructions including MIPs were obtained to evaluate the vascular anatomy.  Contrast: OMNIPAQUE IOHEXOL 300 MG/ML IV SOLN  Comparison:   06/14/2003  Findings:  No filling defects in the pulmonary arteries to suggest pulmonary emboli.  Prior right mastectomy.  Chronic density is noted in the right apex, likely scarring, possibly from prior radiation.  Heart is mildly enlarged.  Aorta is normal caliber. Coronary artery calcifications are present. No mediastinal, hilar, or axillary adenopathy.  Scarring in the lung bases.  No pleural effusions.  Diffuse enlargement of the right adrenal gland, stable.  No acute findings in the upper abdomen.  No acute bony abnormality.  Review of the MIP images confirms the above findings.  IMPRESSION: No evidence of pulmonary embolus.  Chronic changes in the right apex, likely postradiation scarring. Prior right mastectomy.  Cardiomegaly, coronary artery disease.  Original Report Authenticated By: Cyndie Chime, M.D.   Other results: EKG: first ekg looked like MAT. Secong EKg sinus tach.  No st segment elevation or depression. I personally reviewed the EKG.  Assessment & Plan by Problem:  Patient Active Problem List  Diagnoses  . Shortness of breath  . Abdominal pain  . Hot flashes not due to menopause  . Cystitis   The patient is distracted, agitated, short tempered and not cooperative in addition to being profusely diaphoretic during my evaluation.  Will address her acute pain with Morphine, treat the UTI, and reevaluate.  The abdominal CT from a few weeks ago shows adrenal enlargement.  Her symptoms have been recurrent so I do think this is the flu.  Consider GI consult in am and perhaps another CT.  Clinically she appears quite symptomatic, but  w/u so far is inconclusive.   Greater than 70 minutes was spent on direct patient care and coordination of care.  Ceanna Wareing 06/06/2011, 12:33 AM

## 2011-06-07 DIAGNOSIS — I369 Nonrheumatic tricuspid valve disorder, unspecified: Secondary | ICD-10-CM

## 2011-06-07 LAB — URINE MICROSCOPIC-ADD ON

## 2011-06-07 LAB — PRO B NATRIURETIC PEPTIDE: Pro B Natriuretic peptide (BNP): 5213 pg/mL — ABNORMAL HIGH (ref 0–450)

## 2011-06-07 LAB — BASIC METABOLIC PANEL WITH GFR
BUN: 10 mg/dL (ref 6–23)
CO2: 28 meq/L (ref 19–32)
Calcium: 9.7 mg/dL (ref 8.4–10.5)
Chloride: 98 meq/L (ref 96–112)
Creatinine, Ser: 0.66 mg/dL (ref 0.50–1.10)
GFR calc Af Amer: >90 mL/min (ref >90)
GFR calc non Af Amer: 82 mL/min — ABNORMAL LOW (ref >90)
Glucose, Bld: 114 mg/dL — ABNORMAL HIGH (ref 70–99)
Potassium: 3.8 meq/L (ref 3.5–5.1)
Sodium: 136 meq/L (ref 135–145)

## 2011-06-07 LAB — CARDIAC PANEL(CRET KIN+CKTOT+MB+TROPI)
CK, MB: 4.4 ng/mL — ABNORMAL HIGH (ref 0.3–4.0)
Relative Index: 3.2 — ABNORMAL HIGH (ref 0.0–2.5)
Total CK: 139 U/L (ref 7–177)
Troponin I: <0.3 ng/mL (ref <0.30)

## 2011-06-07 LAB — URINALYSIS, ROUTINE W REFLEX MICROSCOPIC
Glucose, UA: NEGATIVE mg/dL
Hgb urine dipstick: NEGATIVE
Specific Gravity, Urine: 1.006 (ref 1.005–1.030)
Urobilinogen, UA: 0.2 mg/dL (ref 0.0–1.0)

## 2011-06-07 MED ORDER — LISINOPRIL 5 MG PO TABS
5.0000 mg | ORAL_TABLET | Freq: Every day | ORAL | Status: DC
  Administered 2011-06-07 – 2011-06-11 (×5): 5 mg via ORAL
  Filled 2011-06-07 (×5): qty 1

## 2011-06-07 MED ORDER — BENZONATATE 100 MG PO CAPS
200.0000 mg | ORAL_CAPSULE | Freq: Three times a day (TID) | ORAL | Status: DC
  Administered 2011-06-08 – 2011-06-11 (×11): 200 mg via ORAL
  Filled 2011-06-07 (×13): qty 2

## 2011-06-07 NOTE — Progress Notes (Signed)
HF core measure:  Patient's BNP is elevated, but EF is 40-45%.  No other cardiovascular risk Theresa Blackwell no ACEI is needed at this time.

## 2011-06-07 NOTE — Initial Assessments (Signed)
Pt in bed appears to be SOB after ambulating to the restroom and back to bed. A/O, verbal, currently denies any pain discomfort rating pain at 0/10. On RA tolerating.  Radial and petal pulses felt at +1. PIV to Lt hand CDI and infusing with transparent dressing. No edema. noted. Skin D/I, no opened areas noted. Lung sounds clear bilaterally, ABD sounds present in all 4 quads, Pt reports last BM as of 06/07/2011, with no difficulties. Currently continent of both B/B, and ambulatory independently, not a fall risk, no isolation precautions needed. Pt appears stable. No apparent problems or concernes noted or stated by patient. Assessment complete.

## 2011-06-07 NOTE — Initial Assessments (Signed)
Pt OOB to chair with bed alarm on and functioning. Currently on 11/2 L O2 via [place] tolerating well with no S/S or reporting of SOB or distress.  Pt is a one person assist with ambulation to bedside commode. She is alert and oriented with husband present at bedside. Pt given PRN tramadol for c/o HA pain rated 10/10. Scores a 17 on Braden scale, and scores a 8 on the fall risk assessment with appropriate measures in place such as bed and chair alarms, foot wear, bedside commode, and signage at the door. Universal precautions taken with pt no isolation precautions. Assessment completed.

## 2011-06-07 NOTE — Progress Notes (Signed)
Utilization review complete 

## 2011-06-07 NOTE — Progress Notes (Signed)
Subjective:  Pt sitting up in chair, states breathing better this am. Denies cp. Still some abd. Pain- more on left side, denies diarrhea. Also c/o non productive cough Objective: Vital signs in last 24 hours: Temp:  [97.8 F (36.6 C)-98.2 F (36.8 C)] 98.2 F (36.8 C) (12/13 1400) Pulse Rate:  [80-92] 87  (12/13 1400) Resp:  [18-19] 18  (12/13 1400) BP: (114-140)/(48-76) 133/73 mmHg (12/13 1730) SpO2:  [93 %-96 %] 96 % (12/13 1925) Last BM Date: 06/06/11 Intake/Output from previous day: 12/12 0701 - 12/13 0700 In: 350 [P.O.:350] Out: 1550 [Urine:1550] Intake/Output this shift: Total I/O In: -  Out: 100 [Urine:100]    General Appearance:    Alert, cooperative, no apparent distress  Lungs:     Decreased BS at the bases , respirations unlabored   Heart:    Regular rate and rhythm, S1 and S2 normal, no murmur, rub   or gallop  Abdomen:     Soft, mild left sided tenderness, bowel sounds active all four quadrants, no rebound tenderness   no masses, no organomegaly  Extremities:    Lower Extremities normal, atraumatic, no cyanosis or edema, RUE edematous with lymphedema wrap.  Neurologic:   CNII-XII intact, normal strength, sensation and reflexes    throughout    Weight change:   Intake/Output Summary (Last 24 hours) at 06/07/11 2217 Last data filed at 06/07/11 2100  Gross per 24 hour  Intake      0 ml  Output   1800 ml  Net  -1800 ml    Lab Results:   [facility] 06/07/11 0858 06/06/11 0603  NA 136 137  K 3.8 4.0  CL 98 101  CO2 28 26  GLUCOSE 114* 121*  BUN 10 7  CREATININE 0.66 0.44*  CALCIUM 9.7 9.7    Basename 06/06/11 0603 06/06/11 0130  WBC 6.1 7.8  HGB 13.2 13.4  HCT 40.9 40.1  PLT 245 199  MCV 96.5 94.4   PT/INR No results found for this basename: LABPROT:2,INR:2 in the last 72 hours ABG  Basename 06/06/11 0130  PHART 7.359  HCO3 26.0*    Micro Results: Recent Results (from the past 240 hour(s))  URINE CULTURE     Status: Normal  (Preliminary result)   Collection Time   06/05/11  5:42 PM      Component Value Range Status Comment   Specimen Description URINE, CLEAN CATCH   Final    Special Requests PATIENT ON FOLLOWING ROCEPHIN   Final    Setup Time [id number]   Final    Colony Count >=100,000 COLONIES/ML   Final    Culture Culture reincubated for better growth   Final    Report Status PENDING   Incomplete    Studies/Results: No results found.  2D-ECHO Study Conclusions  - Left ventricle: Poor endocardial definition The cavity size was normal. Wall thickness was increased in a pattern of moderate LVH. Systolic function was mildly to moderately reduced. The estimated ejection fraction was in the range of 40% to 45%. Diffuse hypokinesis. - Aortic valve: Trivial regurgitation. Transthoracic echocardiography. M-mode, complete 2D, spectral Doppler, and color Doppler. Height: Height: 170.2cm. Height: 67in. Weight: Weight: 81.6kg. Weight: 179.6lb. Body mass index: BMI: 28.2kg/m^2. Body surface area: BSA: 1.39m^2. Blood pressure: 127/65. Patient status: Inpatient. Location: Echo laboratory.    Medications: Scheduled Meds:   . antiseptic oral rinse  15 mL Mouth Rinse BID  . cefTRIAXone (ROCEPHIN)  IV  1 g Intravenous Q24H  . citalopram  20  mg Oral Daily  . enoxaparin  40 mg Subcutaneous Q24H  . ergocalciferol  50,000 Units Oral Q14 Days  . furosemide  40 mg Intravenous BID  . guaiFENesin  600 mg Oral BID  . ipratropium  0.5 mg Nebulization Q6H  . lisinopril  5 mg Oral Daily  . pantoprazole  40 mg Oral Q1200  . pregabalin  75 mg Oral BID  . senna  1 tablet Oral BID  . sodium chloride  3 mL Intravenous Q12H  . tiotropium  18 mcg Inhalation Daily  . tiZANidine  4 mg Oral QHS   Continuous Infusions:  PRN Meds:.sodium chloride, acetaminophen, alum & mag hydroxide-simeth, hydrALAZINE, HYDROcodone-acetaminophen, levalbuterol, morphine, ondansetron (ZOFRAN) IV, ondansetron, sodium chloride,  traMADol Assessment/Plan: Patient Active Hospital Problem List: CHF, systolic   With global hypokinesis per echo, cardiac enzymes neg. will consult cardiology in am for furthe recs.SOB improving with diuresis, low dose ACEI added, follow UTI/cystitis - continue antibiotics, follow cultures Abdominal pain   -will obtain CT of abd to further eval, uti possibly contirbuting but pt has been on abx but symptoms not improving. Also continue ppi, lipase on admit neg.  H/O CAD - cardiac enzymes neg H/O R.mastectomy  LOS: 2 days   Candie Gintz C 06/07/2011, 10:17 PM

## 2011-06-07 NOTE — Progress Notes (Signed)
Patient has been oriented to the call bell system.  Patient verbally states understanding of using call bell before attempting transfers and ambulating.  Patient has periods of intermittent confusion and becomes agitated that her bed alarm & chair alarm are activated.  Staff re-orients patient and explains purpose of alarms for patient safety.  Patient states she is still going to get up when she wants to.  MD is aware.  Will continue to monitor. Nolon Nations

## 2011-06-08 ENCOUNTER — Other Ambulatory Visit: Payer: Self-pay

## 2011-06-08 ENCOUNTER — Encounter (HOSPITAL_COMMUNITY): Payer: Self-pay | Admitting: Radiology

## 2011-06-08 ENCOUNTER — Inpatient Hospital Stay (HOSPITAL_COMMUNITY): Payer: Medicare Other

## 2011-06-08 LAB — CBC
HCT: 42 % (ref 36.0–46.0)
Hemoglobin: 13.2 g/dL (ref 12.0–15.0)
RBC: 4.28 MIL/uL (ref 3.87–5.11)
RDW: 13.3 % (ref 11.5–15.5)
WBC: 9 10*3/uL (ref 4.0–10.5)

## 2011-06-08 LAB — BASIC METABOLIC PANEL
Chloride: 92 mEq/L — ABNORMAL LOW (ref 96–112)
GFR calc Af Amer: >90 mL/min (ref >90)
Potassium: 3.6 mEq/L (ref 3.5–5.1)

## 2011-06-08 LAB — URINE CULTURE
Colony Count: >=100000
Culture  Setup Time: 201212120403

## 2011-06-08 LAB — CARDIAC PANEL(CRET KIN+CKTOT+MB+TROPI): CK, MB: 4.3 ng/mL — ABNORMAL HIGH (ref 0.3–4.0)

## 2011-06-08 MED ORDER — IOHEXOL 300 MG/ML  SOLN
20.0000 mL | INTRAMUSCULAR | Status: AC
Start: 2011-06-08 — End: 2011-06-08

## 2011-06-08 MED ORDER — FUROSEMIDE 40 MG PO TABS
40.0000 mg | ORAL_TABLET | Freq: Two times a day (BID) | ORAL | Status: DC
  Administered 2011-06-08 – 2011-06-11 (×6): 40 mg via ORAL
  Filled 2011-06-08 (×8): qty 1

## 2011-06-08 MED ORDER — IOHEXOL 300 MG/ML  SOLN
100.0000 mL | Freq: Once | INTRAMUSCULAR | Status: AC | PRN
  Administered 2011-06-08: 100 mL via INTRAVENOUS

## 2011-06-08 NOTE — Plan of Care (Signed)
Problem: Phase I Progression Outcomes Goal: Other Phase II Outcomes/Goals Outcome: Progressing     

## 2011-06-08 NOTE — Progress Notes (Signed)
Subjective:  Complaining of left-sided chest- sharp, radiating to her shoulder blade. She states that the pain in her shoulder areas worse with movement of her left upper extremity. States her breathing is better today. Denies further abdominal pain today, tolerating by mouth well. She states today that her primary care physician is Dr. Eula Blackwell. Objective: Vital signs in last 24 hours: Temp:  [97.9 F (36.6 C)-98.6 F (37 C)] 97.9 F (36.6 C) (12/14 1400) Pulse Rate:  [86-94] 94  (12/14 1400) Resp:  [18-19] 18  (12/14 1400) BP: (127-145)/(63-76) 127/67 mmHg (12/14 1400) SpO2:  [93 %-96 %] 95 % (12/14 1400) Last BM Date: 06/07/11 Intake/Output from previous day: 12/13 0701 - 12/14 0700 In: -  Out: 1100 [Urine:1100] Intake/Output this shift: Total I/O In: 240 [P.O.:240] Out: 1300 [Urine:1300]    General Appearance:    Alert, cooperative, no apparent distress  Lungs:     Decreased BS at the bases , respirations unlabored   Heart:    Regular rate and rhythm, S1 and S2 normal, no murmur, rub   or gallop  Abdomen:     Soft, nontender, bowel sounds active all four quadrants,   no masses, no organomegaly  Extremities:    Lower Extremities normal, atraumatic, no cyanosis or edema, RUE edematous with lymphedema wrap.she is tender over her left shoulder blade area.   Neurologic:   CNII-XII intact, normal strength, sensation and reflexes    throughout    Weight change:   Intake/Output Summary (Last 24 hours) at 06/08/11 1614 Last data filed at 06/08/11 1000  Gross per 24 hour  Intake    240 ml  Output   2400 ml  Net  -2160 ml    Lab Results:   [facility] 06/08/11 0952 06/07/11 0858  NA 132* 136  K 3.6 3.8  CL 92* 98  CO2 29 28  GLUCOSE 94 114*  BUN 12 10  CREATININE 0.71 0.66  CALCIUM 9.6 9.7    Basename 06/08/11 0952 06/06/11 0603  WBC 9.0 6.1  HGB 13.2 13.2  HCT 42.0 40.9  PLT 176 245  MCV 98.1 96.5   PT/INR No results found for this basename: LABPROT:2,INR:2  in the last 72 hours ABG  Basename 06/06/11 0130  PHART 7.359  HCO3 26.0*    Micro Results: Recent Results (from the past 240 hour(s))  URINE CULTURE     Status: Normal   Collection Time   06/05/11  5:42 PM      Component Value Range Status Comment   Specimen Description URINE, CLEAN CATCH   Final    Special Requests PATIENT ON FOLLOWING ROCEPHIN   Final    Setup Time [id number]   Final    Colony Count >=100,000 COLONIES/ML   Final    Culture     Final    Value: GROUP B STREP(S.AGALACTIAE)ISOLATED     Note: TESTING AGAINST S. AGALACTIAE NOT ROUTINELY PERFORMED DUE TO PREDICTABILITY OF AMP/PEN/VAN SUSCEPTIBILITY.   Report Status 06/08/2011 FINAL   Final    Studies/Results: Ct Abdomen W Contrast  06/08/2011  *RADIOLOGY REPORT*  Clinical Data:  Abdominal pain  CT ABDOMEN WITH CONTRAST  Technique:  Multidetector CT imaging of the abdomen was performed using the standard protocol following bolus administration of intravenous contrast.  Contrast: OMNIPAQUE IOHEXOL 300 MG/ML IV SOLN  Comparison:  05/02/2011  Findings:  Linear atelectasis in the lung bases, new since previous study.  Calcification in the aortic and mitral valves.  The liver, spleen, gallbladder, and  pancreas are unremarkable.  Right adrenal gland nodule measuring 2.6 cm maximal diameter, stable since previous study, likely representing adenoma.  Calcification of the abdominal aorta with torsion.  No aneurysm.  No retroperitoneal lymphadenopathy.  The renal nephrograms are symmetrical.  No solid mass or hydronephrosis in either kidney.  Focal scarring in the upper pole of the left kidney.  The stomach is decompressed. Decompression of the stomach precludes evaluation for wall thickening. Small bowel are not distended.  Postsurgical changes in the anterior abdominal wall are incompletely included on the study. No free air or free fluid in the abdomen.  Stool filled colon without significant distension.  Degenerative changes  in the lumbar spine. Probable postoperative defect in the upper iliac bone.  IMPRESSION: New linear atelectasis in the lung bases.  Stable adrenal gland adenomas.  Calcification of the aorta.  Visible colon and small bowel are not distended.  Small pericardial effusion or thickening.  Original Report Authenticated By: Marlon Pel, M.D.    2D-ECHO Study Conclusions  - Left ventricle: Poor endocardial definition The cavity size was normal. Wall thickness was increased in a pattern of moderate LVH. Systolic function was mildly to moderately reduced. The estimated ejection fraction was in the range of 40% to 45%. Diffuse hypokinesis. - Aortic valve: Trivial regurgitation. Transthoracic echocardiography. M-mode, complete 2D, spectral Doppler, and color Doppler. Height: Height: 170.2cm. Height: 67in. Weight: Weight: 81.6kg. Weight: 179.6lb. Body mass index: BMI: 28.2kg/m^2. Body surface area: BSA: 1.28m^2. Blood pressure: 127/65. Patient status: Inpatient. Location: Echo laboratory.    Medications: Scheduled Meds:    . antiseptic oral rinse  15 mL Mouth Rinse BID  . benzonatate  200 mg Oral TID  . cefTRIAXone (ROCEPHIN)  IV  1 g Intravenous Q24H  . citalopram  20 mg Oral Daily  . enoxaparin  40 mg Subcutaneous Q24H  . ergocalciferol  50,000 Units Oral Q14 Days  . furosemide  40 mg Oral BID  . guaiFENesin  600 mg Oral BID  . iohexol  20 mL Oral Q1 Hr x 2  . ipratropium  0.5 mg Nebulization Q6H  . lisinopril  5 mg Oral Daily  . pantoprazole  40 mg Oral Q1200  . pregabalin  75 mg Oral BID  . senna  1 tablet Oral BID  . sodium chloride  3 mL Intravenous Q12H  . tiotropium  18 mcg Inhalation Daily  . tiZANidine  4 mg Oral QHS  . DISCONTD: furosemide  40 mg Intravenous BID   Continuous Infusions:  PRN Meds:.sodium chloride, acetaminophen, alum & mag hydroxide-simeth, hydrALAZINE, HYDROcodone-acetaminophen, iohexol, levalbuterol, morphine, ondansetron (ZOFRAN) IV, ondansetron,  sodium chloride, traMADol Assessment/Plan: Patient Active Hospital Problem List: CHF, systolic -  With global hypokinesis per echo, cardiac enzymes neg. I have consulted [facility] cardiology for further recommendations. Continue ACEI , symptoms improving with diuresis, will change to by mouth lasix Chest pain - with radiation to left shoulder blade. She is also tender to palpation over her left shoulder blade - cardiac enzymes  Cycled on admission are negative, we'll repeat another EKG and cardiac enzyme x one, obtain an x-ray of the left shoulder as well Group B strep UTI - Cultures as above with a group B strep, will continue Rocephin and follow.  Abdominal pain   -clinically improved  CT scan of abdomen and pelvis on revealing.#2 possible contributing factor, other workup negative.  H/O CAD - cardiac enzymes neg H/O R.mastectomy  Her PCP is Dr. Eula Blackwell, will transfer patient to his service, spoke  with Dr. Theressa Millard and he'll take over patient's care beginning in the a.m.  LOS: 3 days   Theresa Blackwell C 06/08/2011, 4:14 PM

## 2011-06-09 DIAGNOSIS — M25519 Pain in unspecified shoulder: Secondary | ICD-10-CM | POA: Diagnosis not present

## 2011-06-09 DIAGNOSIS — E876 Hypokalemia: Secondary | ICD-10-CM | POA: Diagnosis not present

## 2011-06-09 LAB — BASIC METABOLIC PANEL
BUN: 12 mg/dL (ref 6–23)
GFR calc non Af Amer: 90 mL/min — ABNORMAL LOW (ref >90)
Glucose, Bld: 102 mg/dL — ABNORMAL HIGH (ref 70–99)
Potassium: 3.2 mEq/L — ABNORMAL LOW (ref 3.5–5.1)

## 2011-06-09 MED ORDER — POTASSIUM CHLORIDE CRYS ER 20 MEQ PO TBCR
20.0000 meq | EXTENDED_RELEASE_TABLET | Freq: Two times a day (BID) | ORAL | Status: AC
  Administered 2011-06-09 – 2011-06-10 (×4): 20 meq via ORAL
  Filled 2011-06-09 (×4): qty 1

## 2011-06-09 NOTE — Progress Notes (Signed)
Subjective: She is still coughing up some stuff and dypsnea persists, but is better. Cough is congested.   Left shoulder still hurting. Worse with certain movements.   Objective:  Intake/Output Summary (Last 24 hours) at 06/09/11 1148 Last data filed at 06/09/11 0900  Gross per 24 hour  Intake    480 ml  Output      0 ml  Net    480 ml   BP 138/61  Pulse 84  Temp(Src) 98 F (36.7 C) (Oral)  Resp 20  Ht 5\' 6"  (1.676 m)  Wt 80.241 kg (176 lb 14.4 oz)  BMI 28.55 kg/m2  SpO2 93% Lungs: Scattered wheeze. Congested cough.   Lab Results:  Basename 06/09/11 0700 06/08/11 0952  NA 137 132*  K 3.2* 3.6  CL 98 92*  CO2 33* 29  GLUCOSE 102* 94  BUN 12 12  CREATININE 0.51 0.71  CALCIUM 9.4 9.6  MG -- --  PHOS -- --   No results found for this basename: AST:2,ALT:2,ALKPHOS:2,BILITOT:2,PROT:2,ALBUMIN:2 in the last 72 hours No results found for this basename: LIPASE:2,AMYLASE:2 in the last 72 hours  Basename 06/08/11 0952  WBC 9.0  NEUTROABS --  HGB 13.2  HCT 42.0  MCV 98.1  PLT 176    Basename 06/08/11 1649 06/07/11 0858 06/06/11 2022  CKTOTAL 252* 139 167  CKMB 4.3* 4.4* 5.5*  CKMBINDEX -- -- --  TROPONINI <0.30 <0.30 <0.30    Studies/Results: Ct Abdomen W Contrast  06/08/2011  *RADIOLOGY REPORT*  Clinical Data:  Abdominal pain  CT ABDOMEN WITH CONTRAST  Technique:  Multidetector CT imaging of the abdomen was performed using the standard protocol following bolus administration of intravenous contrast.  Contrast: OMNIPAQUE IOHEXOL 300 MG/ML IV SOLN  Comparison:  05/02/2011  Findings:  Linear atelectasis in the lung bases, new since previous study.  Calcification in the aortic and mitral valves.  The liver, spleen, gallbladder, and pancreas are unremarkable.  Right adrenal gland nodule measuring 2.6 cm maximal diameter, stable since previous study, likely representing adenoma.  Calcification of the abdominal aorta with torsion.  No aneurysm.  No retroperitoneal  lymphadenopathy.  The renal nephrograms are symmetrical.  No solid mass or hydronephrosis in either kidney.  Focal scarring in the upper pole of the left kidney.  The stomach is decompressed. Decompression of the stomach precludes evaluation for wall thickening. Small bowel are not distended.  Postsurgical changes in the anterior abdominal wall are incompletely included on the study. No free air or free fluid in the abdomen.  Stool filled colon without significant distension.  Degenerative changes in the lumbar spine. Probable postoperative defect in the upper iliac bone.  IMPRESSION: New linear atelectasis in the lung bases.  Stable adrenal gland adenomas.  Calcification of the aorta.  Visible colon and small bowel are not distended.  Small pericardial effusion or thickening.  Original Report Authenticated By: Marlon Pel, M.D.   Dg Shoulder Left  06/08/2011  *RADIOLOGY REPORT*  Clinical Data: Shoulder pain, no injury  LEFT SHOULDER - 2+ VIEW  Comparison: None.  Findings: Normal alignment.  No fracture.  No significant degenerative change.  Mild down sloping of the acromion could predispose the patient to impingement of the rotator cuff.  IMPRESSION: No acute bony abnormality.  Original Report Authenticated By: Camelia Phenes, M.D.   Medications: Scheduled Meds:   . antiseptic oral rinse  15 mL Mouth Rinse BID  . benzonatate  200 mg Oral TID  . cefTRIAXone (ROCEPHIN)  IV  1 g Intravenous Q24H  . citalopram  20 mg Oral Daily  . enoxaparin  40 mg Subcutaneous Q24H  . ergocalciferol  50,000 Units Oral Q14 Days  . furosemide  40 mg Oral BID  . guaiFENesin  600 mg Oral BID  . ipratropium  0.5 mg Nebulization Q6H  . lisinopril  5 mg Oral Daily  . pantoprazole  40 mg Oral Q1200  . pregabalin  75 mg Oral BID  . senna  1 tablet Oral BID  . sodium chloride  3 mL Intravenous Q12H  . tiotropium  18 mcg Inhalation Daily  . tiZANidine  4 mg Oral QHS  . DISCONTD: furosemide  40 mg Intravenous BID     Continuous Infusions:  PRN Meds:.sodium chloride, acetaminophen, alum & mag hydroxide-simeth, hydrALAZINE, HYDROcodone-acetaminophen, levalbuterol, morphine, ondansetron (ZOFRAN) IV, ondansetron, sodium chloride, traMADol  Assessment/Plan: Active Problems:  Shortness of breath - CT negative for PE. Echo suggests some evidence of LV dysfunction, but not much. She is on Spiriva and ipatropium regularly with Xopenex prn. On ceftriaxone as well. Will stop ipatropium. Continue ceftriaxone.   Cystitis - WBCs on initial urine and on ceftriaxone. No culture done.   Shoulder pain - negative xray  Hypokalemia - replete   LOS: 4 days   Theresa Blackwell 06/09/2011, 11:48 AM

## 2011-06-10 ENCOUNTER — Inpatient Hospital Stay (HOSPITAL_COMMUNITY): Payer: Medicare Other

## 2011-06-10 DIAGNOSIS — R51 Headache: Secondary | ICD-10-CM | POA: Diagnosis present

## 2011-06-10 DIAGNOSIS — R519 Headache, unspecified: Secondary | ICD-10-CM | POA: Diagnosis present

## 2011-06-10 LAB — BASIC METABOLIC PANEL
BUN: 11 mg/dL (ref 6–23)
CO2: 35 mEq/L — ABNORMAL HIGH (ref 19–32)
Calcium: 9.6 mg/dL (ref 8.4–10.5)
Chloride: 97 mEq/L (ref 96–112)
Creatinine, Ser: 0.74 mg/dL (ref 0.50–1.10)
GFR calc Af Amer: >90 mL/min (ref >90)
GFR calc non Af Amer: 79 mL/min — ABNORMAL LOW (ref >90)
Glucose, Bld: 96 mg/dL (ref 70–99)
Potassium: 4 mEq/L (ref 3.5–5.1)
Sodium: 137 mEq/L (ref 135–145)

## 2011-06-10 MED ORDER — FUROSEMIDE 40 MG PO TABS: 40.0000 mg | ORAL_TABLET | Freq: Two times a day (BID) | ORAL | Status: DC

## 2011-06-10 MED ORDER — POTASSIUM CHLORIDE CRYS ER 20 MEQ PO TBCR: 20.0000 meq | EXTENDED_RELEASE_TABLET | Freq: Two times a day (BID) | ORAL | Status: DC

## 2011-06-10 MED ORDER — LISINOPRIL 5 MG PO TABS: 5.0000 mg | ORAL_TABLET | Freq: Every day | ORAL | Status: DC

## 2011-06-10 MED ORDER — BENZONATATE 200 MG PO CAPS: 200.0000 mg | ORAL_CAPSULE | Freq: Three times a day (TID) | ORAL | Status: AC | PRN

## 2011-06-10 MED ORDER — CEFUROXIME AXETIL 250 MG PO TABS: 250.0000 mg | ORAL_TABLET | Freq: Two times a day (BID) | ORAL | Status: AC

## 2011-06-10 MED ORDER — TIZANIDINE HCL 4 MG PO TABS
4.0000 mg | ORAL_TABLET | Freq: Every day | ORAL | Status: DC
  Administered 2011-06-10: 4 mg via ORAL
  Filled 2011-06-10 (×2): qty 1

## 2011-06-10 MED ORDER — SENNA 8.6 MG PO TABS: 1.0000 | ORAL_TABLET | Freq: Every day | ORAL | Status: DC

## 2011-06-10 NOTE — Progress Notes (Signed)
Subjective: Complains of headache. Not sure she has told any physicians about this. She did not mention it yesterday. Has been there for about a month despite pain meds. Is frontal and behind left ear to some degree.   Cough and breathing are better.   Objective: Weight change: 1.043 kg (2 lb 4.8 oz)  Intake/Output Summary (Last 24 hours) at 06/10/11 0843 Last data filed at 06/09/11 1300  Gross per 24 hour  Intake    360 ml  Output    350 ml  Net     10 ml   EXAM: LUNGS: decreased breath sounds but clear.  NEURO: Ox3 CN intact. Motor 5/5  Lab Results:  Basename 06/10/11 0602 06/09/11 0700  NA 137 137  K 4.0 3.2*  CL 97 98  CO2 35* 33*  GLUCOSE 96 102*  BUN 11 12  CREATININE 0.74 0.51  CALCIUM 9.6 9.4  MG -- --  PHOS -- --   No results found for this basename: AST:2,ALT:2,ALKPHOS:2,BILITOT:2,PROT:2,ALBUMIN:2 in the last 72 hours No results found for this basename: LIPASE:2,AMYLASE:2 in the last 72 hours  Basename 06/08/11 0952  WBC 9.0  NEUTROABS --  HGB 13.2  HCT 42.0  MCV 98.1  PLT 176    Basename 06/08/11 1649 06/07/11 0858  CKTOTAL 252* 139  CKMB 4.3* 4.4*  CKMBINDEX -- --  TROPONINI <0.30 <0.30   No components found with this basename: POCBNP:3 No results found for this basename: DDIMER:2 in the last 72 hours No results found for this basename: HGBA1C:2 in the last 72 hours No results found for this basename: CHOL:2,HDL:2,LDLCALC:2,TRIG:2,CHOLHDL:2,LDLDIRECT:2 in the last 72 hours No results found for this basename: TSH,T4TOTAL,FREET3,T3FREE,THYROIDAB in the last 72 hours No results found for this basename: VITAMINB12:2,FOLATE:2,FERRITIN:2,TIBC:2,IRON:2,RETICCTPCT:2 in the last 72 hours  Studies/Results: Dg Shoulder Left  06/08/2011  *RADIOLOGY REPORT*  Clinical Data: Shoulder pain, no injury  LEFT SHOULDER - 2+ VIEW  Comparison: None.  Findings: Normal alignment.  No fracture.  No significant degenerative change.  Mild down sloping of the acromion  could predispose the patient to impingement of the rotator cuff.  IMPRESSION: No acute bony abnormality.  Original Report Authenticated By: Camelia Phenes, M.D.   Medications: Scheduled Meds:   . antiseptic oral rinse  15 mL Mouth Rinse BID  . benzonatate  200 mg Oral TID  . cefTRIAXone (ROCEPHIN)  IV  1 g Intravenous Q24H  . citalopram  20 mg Oral Daily  . enoxaparin  40 mg Subcutaneous Q24H  . ergocalciferol  50,000 Units Oral Q14 Days  . furosemide  40 mg Oral BID  . guaiFENesin  600 mg Oral BID  . lisinopril  5 mg Oral Daily  . pantoprazole  40 mg Oral Q1200  . potassium chloride  20 mEq Oral BID  . pregabalin  75 mg Oral BID  . senna  1 tablet Oral BID  . sodium chloride  3 mL Intravenous Q12H  . tiotropium  18 mcg Inhalation Daily  . tiZANidine  4 mg Oral QHS  . DISCONTD: ipratropium  0.5 mg Nebulization Q6H   Continuous Infusions:  PRN Meds:.sodium chloride, acetaminophen, alum & mag hydroxide-simeth, hydrALAZINE, HYDROcodone-acetaminophen, levalbuterol, morphine, ondansetron (ZOFRAN) IV, ondansetron, sodium chloride, traMADol  Assessment/Plan: Active Problems:  Shortness of breath - presumed due to bronchitis. She is better with less cough. Continue ceftriaxone.   Shoulder pain - better  Hypokalemia - improved. Continue K through tomorrow.   Headache - ? If this could be a sinus infection. Will get  sinus series.    LOS: 5 days   Theresa Blackwell CHARLES 06/10/2011, 8:43 AM

## 2011-06-11 DIAGNOSIS — M1990 Unspecified osteoarthritis, unspecified site: Secondary | ICD-10-CM | POA: Diagnosis present

## 2011-06-11 DIAGNOSIS — J440 Chronic obstructive pulmonary disease with acute lower respiratory infection: Secondary | ICD-10-CM | POA: Diagnosis not present

## 2011-06-11 DIAGNOSIS — I509 Heart failure, unspecified: Secondary | ICD-10-CM

## 2011-06-11 MED ORDER — ALBUTEROL SULFATE (2.5 MG/3ML) 0.083% IN NEBU: 2.5000 mg | INHALATION_SOLUTION | Freq: Three times a day (TID) | RESPIRATORY_TRACT | Status: DC

## 2011-06-11 NOTE — Progress Notes (Signed)
   CARE MANAGEMENT NOTE 06/11/2011  Patient:  Theresa Blackwell, Theresa Blackwell   Account Number:  [id number]  Date Initiated:  06/06/2011  Documentation initiated by:  Letha Cape  Subjective/Objective Assessment:   dx sob, abd pain, uti  admit - lives with spouse.     Action/Plan:   06/11/2011 Pt for d/c to home with O2 and HHRN. Family also requesting info on hospital bed. Neb also ordered.   Anticipated DC Date:  06/11/2011   Anticipated DC Plan:  HOME W HOME HEALTH SERVICES      DC Planning Services  CM consult      [facility] Choice  HOME HEALTH   Choice offered to / List presented to:  C-1 Patient   DME arranged  [facility]  NEBULIZER MACHINE      DME agency  [facility]     [facility] arranged  HH-1 RN      [facility] agency  [facility]   Status of service:  Completed, signed off Medicare Important Message given?   (If response is "NO", the following Medicare IM given date fields will be blank) Date Medicare IM given:   Date Additional Medicare IM given:    Discharge Disposition:  HOME W HOME HEALTH SERVICES  Per UR Regulation:    Comments:  06/11/2011 Met with pt and husband and they selected [facility] for [facility], DME also ordered from [facility]. CRoyal RN MPH [phone]  06/06/11 15:31 Letha Cape RN, BSN [phone] Patient lives with spouse, GI consulted. NCM will continue to follow for dc needs.

## 2011-06-11 NOTE — Progress Notes (Signed)
Patient's O2 sats 88 % room air at rest, and 79% room air with ambulation.  O2 sats up to 94 % with O2 2 1/2 liters [place] with ambulation.  Will continue to monitor.  Colman Cater

## 2011-06-11 NOTE — Discharge Summary (Signed)
Physician Discharge Summary  Patient ID: Theresa Blackwell MRN: [id number] DOB/AGE: 1931/06/12 77 y.o.  Admit date: 06/05/2011 Discharge date: 06/11/2011  Admission Diagnoses:  Discharge Diagnoses:  Principal Problem:  *Shortness of breath Active Problems:  Abdominal pain  Hot flashes not due to menopause  Cystitis  Shoulder pain  Hypokalemia  Headache  COPD (chronic obstructive pulmonary disease) with acute bronchitis  CHF (congestive heart failure)  Osteoarthritis   Discharged Condition:  good  Hospital Course: 77 year old female with history of tobacco use, COPD, admitted with shortness of breath, found to have UTI, with CHF EF of 45% on 2-D echo, rule out for MI, treated for bronchitis and COPD. Problem #1: shortness of breath, most likely combination of CHF and COPD and bronchitis with history of tobacco use, patient was started on IV Lasix with good response, her BNP improved, clinically improved symptoms with diuresing, patient was switched to p.o. Lasix, ACE inhibitor was added. Blood pressure was controlled. Rule out for an MI. No arrhythmia, normal sinus rhythm on the monitor. 2-D echocardiogram showed EF of 45% with mild hypokinesis Patient will maintain on Lasix along with potential for hypokinemia her blood chemistries remained stable, potential remained stable. Problem #2: COPD, history of tobacco use, bronchitis versus pneumonia. Patient was on Rocephin IV, switched to Ceftin p.o. Continue on oxygen, albuterol nebulizer, Spiriva. Discussed about tobacco cessation. Patient will be sent home on albuterol nebulizer, O2 if needed. Problem #3: depression patient will continue on her medications at home Celexa. Problem #4 shoulder pain, x-ray showed osteoarthritis,continued ontramadol. Problem #5: headache, sinus x-rays negative. Problem 6 chronic neuropathy continue on lyrica. Problem 7: right arm lymphedema from chronic breast surgery; stable Problem #8: UTI culture showed  strep, patient was started on Rocephin IV, switched to Ceftin p.o.. Consults: cardiology  Significant Diagnostic Studies: labs: cardiac markers negative, CBC white count of 9.0 hemoglobin normal, blood chemistries pressure 4.0 creatinine 0.7 sodium 137, radiology: CXR: CHF and cardiac graphics: Echocardiogram: EF of 45% mild hypokinesis  Treatments: IV hydration, antibiotics: ceftriaxone, analgesia: acetaminophen w/ codeine and respiratory therapy: O2 and albuterol/atropine nebulizer  Discharge Exam: Blood pressure 110/61, pulse 53, temperature 97.9 F (36.6 C), temperature source Oral, resp. rate 20, height 5\' 6"  (1.676 m), weight 81.285 kg (179 lb 3.2 oz), SpO2 95.00%. General appearance: alert Resp: rhonchi bibasilar Cardio: regular rate and rhythm, S1, S2 normal, no murmur, click, rub or gallop GI: soft, non-tender; bowel sounds normal; no masses,  no organomegaly  Disposition:  home with home health nurse, nebulizer machine.  Discharge Orders    Future Orders Please Complete By Expires   Diet - low sodium heart healthy      Diet - low sodium heart healthy      Increase activity slowly      Increase activity slowly        Current Discharge Medication List    START taking these medications   Details  albuterol (PROVENTIL) (2.5 MG/3ML) 0.083% nebulizer solution Take 3 mLs (2.5 mg total) by nebulization 3 (three) times daily. Qty: 75 mL, Refills: 12    benzonatate (TESSALON) 200 MG capsule Take 1 capsule (200 mg total) by mouth 3 (three) times daily as needed for cough. Qty: 30 capsule, Refills: 0    cefUROXime (CEFTIN) 250 MG tablet Take 1 tablet (250 mg total) by mouth 2 (two) times daily. Qty: 10 tablet, Refills: 0    furosemide (LASIX) 40 MG tablet Take 1 tablet (40 mg total) by mouth 2 (two) times daily.  Qty: 60 tablet, Refills: 5    lisinopril (PRINIVIL,ZESTRIL) 5 MG tablet Take 1 tablet (5 mg total) by mouth daily. Qty: 30 tablet, Refills: 5    potassium chloride  SA (K-DUR,KLOR-CON) 20 MEQ tablet Take 1 tablet (20 mEq total) by mouth 2 (two) times daily. Qty: 60 tablet, Refills: 5    senna (SENOKOT) 8.6 MG TABS Take 1 tablet (8.6 mg total) by mouth daily. Qty: 60 each, Refills: 5      CONTINUE these medications which have NOT CHANGED   Details  acetaminophen (TYLENOL) 500 MG tablet Take 500 mg by mouth every 6 (six) hours as needed. For pain     acetaminophen-codeine (TYLENOL #3) 300-30 MG per tablet Take 1 tablet by mouth 2 (two) times daily as needed. For pain     albuterol (PROVENTIL HFA;VENTOLIN HFA) 108 (90 BASE) MCG/ACT inhaler Inhale 2 puffs into the lungs every 4 (four) hours as needed. Shortness of breath     citalopram (CELEXA) 20 MG tablet Take 20 mg by mouth daily.      ergocalciferol (VITAMIN D2) 50000 UNITS capsule Take 50,000 Units by mouth every 14 (fourteen) days.     fluticasone (FLONASE) 50 MCG/ACT nasal spray Place 2 sprays into the nose daily as needed. For allergies     omeprazole (PRILOSEC) 20 MG capsule Take 20 mg by mouth daily.      pregabalin (LYRICA) 75 MG capsule Take 75 mg by mouth 2 (two) times daily.      tiotropium (SPIRIVA) 18 MCG inhalation capsule Place 18 mcg into inhaler and inhale daily.      tiZANidine (ZANAFLEX) 4 MG capsule Take 4 mg by mouth at bedtime.      traMADol (ULTRAM) 50 MG tablet Take 100 mg by mouth every 6 (six) hours as needed. Maximum dose= 8 tablets per day For pain        Follow-up Information    Follow up with Neenah Canter.   Contact information:   [address] [place] [address] [phone]        Discharge planning had taken over 30 minutes.  SignedGeorgann Housekeeper 06/11/2011, 8:23 AM

## 2011-06-13 LAB — METANEPHRINES, URINE, 24 HOUR
Metaneph Total, Ur: 759 mcg/24 h (ref 224–832)
Normetanephrine, 24H Ur: 590 mcg/24 h (ref 122–676)

## 2011-06-22 ENCOUNTER — Other Ambulatory Visit: Payer: Self-pay | Admitting: Gastroenterology

## 2012-02-01 ENCOUNTER — Inpatient Hospital Stay (HOSPITAL_COMMUNITY)
Admission: EM | Admit: 2012-02-01 | Discharge: 2012-02-05 | DRG: 392 | Disposition: A | Discharge status: Home / Self Care | Payer: Medicare Other | Attending: Internal Medicine | Admitting: Internal Medicine

## 2012-02-01 DIAGNOSIS — F329 Major depressive disorder, single episode, unspecified: Secondary | ICD-10-CM | POA: Diagnosis present

## 2012-02-01 DIAGNOSIS — N390 Urinary tract infection, site not specified: Secondary | ICD-10-CM

## 2012-02-01 DIAGNOSIS — R109 Unspecified abdominal pain: Secondary | ICD-10-CM

## 2012-02-01 DIAGNOSIS — F3289 Other specified depressive episodes: Secondary | ICD-10-CM | POA: Diagnosis present

## 2012-02-01 DIAGNOSIS — A419 Sepsis, unspecified organism: Secondary | ICD-10-CM | POA: Diagnosis present

## 2012-02-01 DIAGNOSIS — N179 Acute kidney failure, unspecified: Secondary | ICD-10-CM | POA: Diagnosis present

## 2012-02-01 DIAGNOSIS — N3020 Other chronic cystitis without hematuria: Secondary | ICD-10-CM | POA: Diagnosis present

## 2012-02-01 DIAGNOSIS — K838 Other specified diseases of biliary tract: Secondary | ICD-10-CM | POA: Diagnosis present

## 2012-02-01 DIAGNOSIS — Z9010 Acquired absence of unspecified breast and nipple: Secondary | ICD-10-CM

## 2012-02-01 DIAGNOSIS — I509 Heart failure, unspecified: Secondary | ICD-10-CM | POA: Diagnosis present

## 2012-02-01 DIAGNOSIS — J4489 Other specified chronic obstructive pulmonary disease: Secondary | ICD-10-CM | POA: Diagnosis present

## 2012-02-01 DIAGNOSIS — J209 Acute bronchitis, unspecified: Secondary | ICD-10-CM | POA: Diagnosis present

## 2012-02-01 DIAGNOSIS — E876 Hypokalemia: Secondary | ICD-10-CM | POA: Diagnosis present

## 2012-02-01 DIAGNOSIS — R932 Abnormal findings on diagnostic imaging of liver and biliary tract: Secondary | ICD-10-CM

## 2012-02-01 DIAGNOSIS — R799 Abnormal finding of blood chemistry, unspecified: Secondary | ICD-10-CM | POA: Diagnosis present

## 2012-02-01 DIAGNOSIS — R1011 Right upper quadrant pain: Principal | ICD-10-CM | POA: Diagnosis present

## 2012-02-01 DIAGNOSIS — E860 Dehydration: Secondary | ICD-10-CM | POA: Diagnosis present

## 2012-02-01 DIAGNOSIS — Z87891 Personal history of nicotine dependence: Secondary | ICD-10-CM

## 2012-02-01 DIAGNOSIS — Z79899 Other long term (current) drug therapy: Secondary | ICD-10-CM

## 2012-02-01 DIAGNOSIS — N189 Chronic kidney disease, unspecified: Secondary | ICD-10-CM | POA: Diagnosis present

## 2012-02-01 DIAGNOSIS — Z853 Personal history of malignant neoplasm of breast: Secondary | ICD-10-CM

## 2012-02-01 DIAGNOSIS — J449 Chronic obstructive pulmonary disease, unspecified: Secondary | ICD-10-CM | POA: Diagnosis present

## 2012-02-01 DIAGNOSIS — R5381 Other malaise: Secondary | ICD-10-CM | POA: Diagnosis present

## 2012-02-01 DIAGNOSIS — I2510 Atherosclerotic heart disease of native coronary artery without angina pectoris: Secondary | ICD-10-CM | POA: Diagnosis present

## 2012-02-01 DIAGNOSIS — IMO0001 Reserved for inherently not codable concepts without codable children: Secondary | ICD-10-CM | POA: Diagnosis present

## 2012-02-01 DIAGNOSIS — J440 Chronic obstructive pulmonary disease with acute lower respiratory infection: Secondary | ICD-10-CM

## 2012-02-01 DIAGNOSIS — G894 Chronic pain syndrome: Secondary | ICD-10-CM | POA: Diagnosis present

## 2012-02-01 HISTORY — DX: Fibromyalgia: M79.7

## 2012-02-01 LAB — URINALYSIS, ROUTINE W REFLEX MICROSCOPIC
Bilirubin Urine: NEGATIVE
Glucose, UA: NEGATIVE mg/dL
Hgb urine dipstick: NEGATIVE
Ketones, ur: NEGATIVE mg/dL
Nitrite: NEGATIVE
Protein, ur: NEGATIVE mg/dL
Specific Gravity, Urine: 1.018 (ref 1.005–1.030)
Urobilinogen, UA: 0.2 mg/dL (ref 0.0–1.0)
pH: 5.5 (ref 5.0–8.0)

## 2012-02-01 LAB — POCT I-STAT, CHEM 8
Calcium, Ion: 0.95 mmol/L — ABNORMAL LOW (ref 1.13–1.30)
Chloride: 105 mEq/L (ref 96–112)
Glucose, Bld: 84 mg/dL (ref 70–99)
HCT: 44 % (ref 36.0–46.0)
TCO2: 25 mmol/L (ref 0–100)

## 2012-02-01 LAB — CBC WITH DIFFERENTIAL/PLATELET
Basophils Absolute: 0 10*3/uL (ref 0.0–0.1)
Basophils Relative: 0 % (ref 0–1)
Eosinophils Absolute: 0.1 10*3/uL (ref 0.0–0.7)
Eosinophils Relative: 1 % (ref 0–5)
HCT: 40.8 % (ref 36.0–46.0)
Hemoglobin: 13.2 g/dL (ref 12.0–15.0)
Lymphocytes Relative: 22 % (ref 12–46)
Lymphs Abs: 2.4 10*3/uL (ref 0.7–4.0)
MCH: 33.9 pg (ref 26.0–34.0)
MCHC: 32.4 g/dL (ref 30.0–36.0)
MCV: 104.9 fL — ABNORMAL HIGH (ref 78.0–100.0)
Monocytes Absolute: 1.1 10*3/uL — ABNORMAL HIGH (ref 0.1–1.0)
Monocytes Relative: 10 % (ref 3–12)
Neutro Abs: 7.1 10*3/uL (ref 1.7–7.7)
Neutrophils Relative %: 66 % (ref 43–77)
Platelets: 240 10*3/uL (ref 150–400)
RBC: 3.89 MIL/uL (ref 3.87–5.11)
RDW: 14.2 % (ref 11.5–15.5)
WBC: 10.6 10*3/uL — ABNORMAL HIGH (ref 4.0–10.5)

## 2012-02-01 LAB — CARDIAC PANEL(CRET KIN+CKTOT+MB+TROPI)
CK, MB: 12.9 ng/mL (ref 0.3–4.0)
Relative Index: 1.5 (ref 0.0–2.5)
Total CK: 842 U/L — ABNORMAL HIGH (ref 7–177)
Troponin I: <0.3 ng/mL (ref <0.30)

## 2012-02-01 LAB — COMPREHENSIVE METABOLIC PANEL
ALT: 16 U/L (ref 0–35)
AST: 36 U/L (ref 0–37)
Albumin: 3.7 g/dL (ref 3.5–5.2)
Alkaline Phosphatase: 67 U/L (ref 39–117)
BUN: 43 mg/dL — ABNORMAL HIGH (ref 6–23)
CO2: 23 mEq/L (ref 19–32)
Calcium: 7.4 mg/dL — ABNORMAL LOW (ref 8.4–10.5)
Chloride: 101 mEq/L (ref 96–112)
Creatinine, Ser: 1.27 mg/dL — ABNORMAL HIGH (ref 0.50–1.10)
GFR calc Af Amer: 45 mL/min — ABNORMAL LOW (ref >90)
GFR calc non Af Amer: 39 mL/min — ABNORMAL LOW (ref >90)
Glucose, Bld: 82 mg/dL (ref 70–99)
Potassium: 4.4 mEq/L (ref 3.5–5.1)
Sodium: 139 mEq/L (ref 135–145)
Total Bilirubin: 0.5 mg/dL (ref 0.3–1.2)
Total Protein: 7 g/dL (ref 6.0–8.3)

## 2012-02-01 LAB — URINE MICROSCOPIC-ADD ON

## 2012-02-01 LAB — LIPASE, BLOOD: Lipase: 41 U/L (ref 11–59)

## 2012-02-01 MED ORDER — SODIUM CHLORIDE 0.9 % IV SOLN
1.0000 g | Freq: Once | INTRAVENOUS | Status: AC
  Administered 2012-02-01: 1 g via INTRAVENOUS
  Filled 2012-02-01: qty 10

## 2012-02-01 MED ORDER — SODIUM CHLORIDE 0.9 % IV BOLUS (SEPSIS)
1000.0000 mL | Freq: Once | INTRAVENOUS | Status: AC
  Administered 2012-02-01: 1000 mL via INTRAVENOUS

## 2012-02-01 NOTE — ED Notes (Signed)
Pt present to ED room 20 with c/o abd pain for 2 years and decrease P.O intake for 1 month.  Per EMS,  ABD pain worse today.  Pt has hx of copd, chf , htn , gerd, hyperlidpdemia, fibromyalgia, chronic abd pain and depression.  cbg in ems was 87, ems reported pt was afib on monitor via.  Per ems pt denies n/v, diarrhea.  Pt denies constipation and dysuria.  abd tender and soft.  Pt on 2.5 liters home o2, 120/60 BP, HR 90, 18 resp.

## 2012-02-01 NOTE — ED Notes (Signed)
Bed:WA20<BR> Expected date:<BR> Expected time:<BR> Means of arrival:<BR> Comments:<BR> EMS

## 2012-02-02 ENCOUNTER — Emergency Department (HOSPITAL_COMMUNITY): Payer: Medicare Other

## 2012-02-02 ENCOUNTER — Inpatient Hospital Stay (HOSPITAL_COMMUNITY): Payer: Medicare Other

## 2012-02-02 ENCOUNTER — Encounter (HOSPITAL_COMMUNITY): Payer: Self-pay | Admitting: *Deleted

## 2012-02-02 DIAGNOSIS — N179 Acute kidney failure, unspecified: Secondary | ICD-10-CM

## 2012-02-02 DIAGNOSIS — N3090 Cystitis, unspecified without hematuria: Secondary | ICD-10-CM

## 2012-02-02 DIAGNOSIS — A419 Sepsis, unspecified organism: Secondary | ICD-10-CM | POA: Diagnosis present

## 2012-02-02 DIAGNOSIS — N390 Urinary tract infection, site not specified: Secondary | ICD-10-CM | POA: Diagnosis present

## 2012-02-02 DIAGNOSIS — N189 Chronic kidney disease, unspecified: Secondary | ICD-10-CM | POA: Diagnosis present

## 2012-02-02 DIAGNOSIS — J440 Chronic obstructive pulmonary disease with acute lower respiratory infection: Secondary | ICD-10-CM

## 2012-02-02 LAB — BLOOD GAS, ARTERIAL
Acid-base deficit: 5.2 mmol/L — ABNORMAL HIGH (ref 0.0–2.0)
Bicarbonate: 20 mEq/L (ref 20.0–24.0)
Delivery systems: POSITIVE
Drawn by: 11249
Expiratory PAP: 5
FIO2: 0.3 %
Inspiratory PAP: 10
Mode: POSITIVE
O2 Saturation: 98.1 %
Patient temperature: 98.6
TCO2: 18.5 mmol/L (ref 0–100)
pCO2 arterial: 39.8 mmHg (ref 35.0–45.0)
pH, Arterial: 7.321 — ABNORMAL LOW (ref 7.350–7.450)
pO2, Arterial: 122 mmHg — ABNORMAL HIGH (ref 80.0–100.0)

## 2012-02-02 LAB — MAGNESIUM: Magnesium: 0.3 mg/dL — CL (ref 1.5–2.5)

## 2012-02-02 LAB — LACTIC ACID, PLASMA: Lactic Acid, Venous: 0.8 mmol/L (ref 0.5–2.2)

## 2012-02-02 LAB — TROPONIN I: Troponin I: <0.3 ng/mL (ref <0.30)

## 2012-02-02 LAB — HEPATIC FUNCTION PANEL
Bilirubin, Direct: 0.2 mg/dL (ref 0.0–0.3)
Indirect Bilirubin: 0.4 mg/dL (ref 0.3–0.9)
Total Protein: 5.8 g/dL — ABNORMAL LOW (ref 6.0–8.3)

## 2012-02-02 LAB — PROCALCITONIN: Procalcitonin: 32.25 ng/mL

## 2012-02-02 LAB — CANCER ANTIGEN 19-9: CA 19-9: 5.1 U/mL — ABNORMAL LOW (ref <35.0)

## 2012-02-02 LAB — PHOSPHORUS: Phosphorus: 2.7 mg/dL (ref 2.3–4.6)

## 2012-02-02 LAB — CARDIAC PANEL(CRET KIN+CKTOT+MB+TROPI)
CK, MB: 10.3 ng/mL (ref 0.3–4.0)
Total CK: 627 U/L — ABNORMAL HIGH (ref 7–177)
Troponin I: 0.31 ng/mL (ref <0.30)

## 2012-02-02 LAB — AMYLASE: Amylase: 19 U/L (ref 0–105)

## 2012-02-02 LAB — TSH: TSH: 0.402 u[IU]/mL (ref 0.350–4.500)

## 2012-02-02 MED ORDER — ONDANSETRON HCL 4 MG/2ML IJ SOLN
4.0000 mg | Freq: Four times a day (QID) | INTRAMUSCULAR | Status: DC | PRN
  Administered 2012-02-02: 4 mg via INTRAVENOUS
  Filled 2012-02-02: qty 2

## 2012-02-02 MED ORDER — TRAMADOL HCL 50 MG PO TABS
100.0000 mg | ORAL_TABLET | Freq: Four times a day (QID) | ORAL | Status: DC | PRN
  Administered 2012-02-02 – 2012-02-03 (×5): 100 mg via ORAL
  Filled 2012-02-02 (×3): qty 2
  Filled 2012-02-02 (×2): qty 1
  Filled 2012-02-02: qty 2

## 2012-02-02 MED ORDER — SODIUM CHLORIDE 0.9 % IV SOLN
INTRAVENOUS | Status: DC
  Administered 2012-02-02: 08:00:00 via INTRAVENOUS

## 2012-02-02 MED ORDER — ONDANSETRON HCL 4 MG PO TABS: 4.0000 mg | ORAL_TABLET | Freq: Four times a day (QID) | ORAL | Status: DC | PRN

## 2012-02-02 MED ORDER — TRIMETHOPRIM 100 MG PO TABS
100.0000 mg | ORAL_TABLET | Freq: Every day | ORAL | Status: DC
  Administered 2012-02-02 – 2012-02-05 (×4): 100 mg via ORAL
  Filled 2012-02-02 (×4): qty 1

## 2012-02-02 MED ORDER — IOHEXOL 300 MG/ML  SOLN
80.0000 mL | Freq: Once | INTRAMUSCULAR | Status: AC | PRN
  Administered 2012-02-02: 80 mL via INTRAVENOUS

## 2012-02-02 MED ORDER — ACETAMINOPHEN 325 MG PO TABS
ORAL_TABLET | ORAL | Status: AC
  Filled 2012-02-02: qty 2

## 2012-02-02 MED ORDER — LORAZEPAM 2 MG/ML IJ SOLN
INTRAMUSCULAR | Status: AC
  Administered 2012-02-02: 0.5 mg
  Filled 2012-02-02: qty 1

## 2012-02-02 MED ORDER — PANTOPRAZOLE SODIUM 40 MG PO TBEC
40.0000 mg | DELAYED_RELEASE_TABLET | Freq: Every day | ORAL | Status: DC
  Administered 2012-02-02 – 2012-02-04 (×3): 40 mg via ORAL
  Filled 2012-02-02 (×4): qty 1

## 2012-02-02 MED ORDER — PREGABALIN 75 MG PO CAPS
75.0000 mg | ORAL_CAPSULE | Freq: Two times a day (BID) | ORAL | Status: DC
  Administered 2012-02-02 – 2012-02-05 (×7): 75 mg via ORAL
  Filled 2012-02-02 (×7): qty 1

## 2012-02-02 MED ORDER — SODIUM CHLORIDE 0.9 % IV BOLUS (SEPSIS)
500.0000 mL | Freq: Once | INTRAVENOUS | Status: AC
  Administered 2012-02-02: 500 mL via INTRAVENOUS

## 2012-02-02 MED ORDER — IPRATROPIUM BROMIDE 0.02 % IN SOLN
0.5000 mg | RESPIRATORY_TRACT | Status: DC | PRN
Start: 2012-02-02 — End: 2012-02-05

## 2012-02-02 MED ORDER — DEXTROSE 5 % IV SOLN
Freq: Once | INTRAVENOUS | Status: AC
  Administered 2012-02-02: 12:00:00 via INTRAVENOUS
  Filled 2012-02-02: qty 100

## 2012-02-02 MED ORDER — CITALOPRAM HYDROBROMIDE 10 MG PO TABS
10.0000 mg | ORAL_TABLET | Freq: Every day | ORAL | Status: DC
  Administered 2012-02-02 – 2012-02-05 (×4): 10 mg via ORAL
  Filled 2012-02-02 (×4): qty 1

## 2012-02-02 MED ORDER — PIPERACILLIN-TAZOBACTAM 3.375 G IVPB
3.3750 g | Freq: Once | INTRAVENOUS | Status: AC
  Administered 2012-02-02: 3.375 g via INTRAVENOUS
  Filled 2012-02-02: qty 50

## 2012-02-02 MED ORDER — SODIUM CHLORIDE 0.9 % IJ SOLN
3.0000 mL | Freq: Two times a day (BID) | INTRAMUSCULAR | Status: DC
  Administered 2012-02-04 – 2012-02-05 (×2): 3 mL via INTRAVENOUS

## 2012-02-02 MED ORDER — TRIAMCINOLONE ACETONIDE 0.1 % EX CREA: 1.0000 "application " | TOPICAL_CREAM | Freq: Two times a day (BID) | CUTANEOUS | Status: DC | PRN

## 2012-02-02 MED ORDER — PIPERACILLIN-TAZOBACTAM 3.375 G IVPB
3.3750 g | Freq: Three times a day (TID) | INTRAVENOUS | Status: DC
  Administered 2012-02-02 – 2012-02-05 (×9): 3.375 g via INTRAVENOUS
  Filled 2012-02-02 (×11): qty 50

## 2012-02-02 MED ORDER — ENOXAPARIN SODIUM 30 MG/0.3ML ~~LOC~~ SOLN
30.0000 mg | SUBCUTANEOUS | Status: DC
  Administered 2012-02-02 – 2012-02-05 (×4): 30 mg via SUBCUTANEOUS
  Filled 2012-02-02 (×4): qty 0.3

## 2012-02-02 MED ORDER — ONDANSETRON HCL 4 MG/2ML IJ SOLN: 4.0000 mg | Freq: Three times a day (TID) | INTRAMUSCULAR | Status: DC | PRN

## 2012-02-02 MED ORDER — FLUTICASONE PROPIONATE 50 MCG/ACT NA SUSP
2.0000 | Freq: Every day | NASAL | Status: DC
  Administered 2012-02-02 – 2012-02-05 (×4): 2 via NASAL
  Filled 2012-02-02: qty 16

## 2012-02-02 MED ORDER — MORPHINE SULFATE 2 MG/ML IJ SOLN
1.0000 mg | INTRAMUSCULAR | Status: DC | PRN
  Administered 2012-02-02 – 2012-02-05 (×9): 1 mg via INTRAVENOUS
  Filled 2012-02-02 (×9): qty 1

## 2012-02-02 MED ORDER — BIOTENE DRY MOUTH MT LIQD
15.0000 mL | Freq: Two times a day (BID) | OROMUCOSAL | Status: DC
  Administered 2012-02-03 – 2012-02-05 (×5): 15 mL via OROMUCOSAL

## 2012-02-02 MED ORDER — TIZANIDINE HCL 4 MG PO TABS
4.0000 mg | ORAL_TABLET | Freq: Every day | ORAL | Status: DC
  Administered 2012-02-02 – 2012-02-05 (×3): 4 mg via ORAL
  Filled 2012-02-02 (×7): qty 1

## 2012-02-02 MED ORDER — MAGNESIUM SULFATE 40 MG/ML IJ SOLN
4.0000 g | Freq: Once | INTRAMUSCULAR | Status: AC
  Administered 2012-02-02: 4 g via INTRAVENOUS
  Filled 2012-02-02: qty 100

## 2012-02-02 MED ORDER — IPRATROPIUM-ALBUTEROL 0.5-2.5 (3) MG/3ML IN SOLN: 3.0000 mL | RESPIRATORY_TRACT | Status: DC | PRN

## 2012-02-02 MED ORDER — TIOTROPIUM BROMIDE MONOHYDRATE 18 MCG IN CAPS
18.0000 ug | ORAL_CAPSULE | Freq: Every day | RESPIRATORY_TRACT | Status: DC
  Administered 2012-02-02 – 2012-02-05 (×4): 18 ug via RESPIRATORY_TRACT
  Filled 2012-02-02: qty 5

## 2012-02-02 MED ORDER — TIZANIDINE HCL 4 MG PO CAPS: 4.0000 mg | ORAL_CAPSULE | Freq: Every day | ORAL | Status: DC

## 2012-02-02 MED ORDER — ACETAMINOPHEN 325 MG PO TABS
650.0000 mg | ORAL_TABLET | Freq: Once | ORAL | Status: AC
  Administered 2012-02-02: 650 mg via ORAL

## 2012-02-02 MED ORDER — SODIUM CHLORIDE 0.9 % IV SOLN: INTRAVENOUS | Status: DC

## 2012-02-02 MED ORDER — MAGNESIUM SULFATE 40 MG/ML IJ SOLN: 4.0000 g | Freq: Once | INTRAMUSCULAR | Status: DC

## 2012-02-02 MED ORDER — ALBUTEROL SULFATE HFA 108 (90 BASE) MCG/ACT IN AERS: 2.0000 | INHALATION_SPRAY | RESPIRATORY_TRACT | Status: DC | PRN

## 2012-02-02 MED ORDER — LORAZEPAM 2 MG/ML IJ SOLN
0.5000 mg | Freq: Once | INTRAMUSCULAR | Status: AC
  Administered 2012-02-02: 0.5 mg via INTRAVENOUS

## 2012-02-02 MED ORDER — ALBUTEROL SULFATE (5 MG/ML) 0.5% IN NEBU
2.5000 mg | INHALATION_SOLUTION | RESPIRATORY_TRACT | Status: DC | PRN
Start: 2012-02-02 — End: 2012-02-05

## 2012-02-02 NOTE — ED Notes (Signed)
Pt placed on bipap by kelly garcia campo rn.  Respiratory at bedside.  Pt in resp distress and very anxious.  Dr Juleen China at bedside.

## 2012-02-02 NOTE — Progress Notes (Signed)
CRITICAL VALUE ALERT  Critical value received:  Mag=0.3 mb=10.3  Date of notification:  8/10  Time of notification: 1110Critical value read back:yes  Nurse who received alert:  b Manasa Spease   MD notified (1st page):  i myers  Time of first page:  1110  MD notified (2nd page):  Time of second page:  Responding MD:  Denna Haggard Time MD responded:  1113

## 2012-02-02 NOTE — Progress Notes (Signed)
I have been asked by Dr Izola Price to review imaging studies with respect to a possibility of extrahepatic biliary obstruction. There has been a change in the calibre of the CBD from 04/2011, 05/2011 to 02/01/2012.Distednded gall bladder may indicate fasting state or obstruction. Since the main pancreatic duct is also dilated, differential Dx is Ampullary CA, pancreatic ca, portal adenopathy  ( hx breast ca). I suggest MRCP, amy, lipase, Ca19-9,follow LFT's. Depending on MRCP she may need EUS to further assess distal CBD.

## 2012-02-02 NOTE — ED Provider Notes (Signed)
History    81yF with abdominal pain. Pt very poor historian. Per husband pt has been complaining or abdominal pain for almost 2 years. Today acutely worse. Pt hunching over in pain at times.Says pain in lower abdomen. Does not lateralize. Nausea. No vomiting. No diarrhea. No fever or chills. No urinary complaints.   CSN: [id number]  Arrival date & time 02/01/12  1955   First MD Initiated Contact with Patient 02/01/12 2309      Chief Complaint  Patient presents with  . Abdominal Pain    (Consider location/radiation/quality/duration/timing/severity/associated sxs/prior treatment) HPI  Past Medical History  Diagnosis Date  . Coronary artery disease   . Cancer rt breast  . History of right mastectomy   . Anxiety   . Smoking     Past Surgical History  Procedure Date  . Breast surgery     No family history on file.  History  Substance Use Topics  . Smoking status: Former Games developer  . Smokeless tobacco: Not on file  . Alcohol Use:     OB History    Grav Para Term Preterm Abortions TAB SAB Ect Mult Living                  Review of Systems   Review of symptoms negative unless otherwise noted in HPI.   Allergies  Review of patient's allergies indicates no known allergies.  Home Medications   Current Outpatient Rx  Name Route Sig Dispense Refill  . ACETAMINOPHEN-CODEINE #3 300-30 MG PO TABS Oral Take 1 tablet by mouth 2 (two) times daily as needed. For pain     . ALBUTEROL SULFATE HFA 108 (90 BASE) MCG/ACT IN AERS Inhalation Inhale 2 puffs into the lungs every 4 (four) hours as needed. Shortness of breath     . CITALOPRAM HYDROBROMIDE 20 MG PO TABS Oral Take 10 mg by mouth daily.     . ERGOCALCIFEROL 50000 UNITS PO CAPS Oral Take 50,000 Units by mouth every 14 (fourteen) days.     Marland Kitchen FLUTICASONE PROPIONATE 50 MCG/ACT NA SUSP Nasal Place 2 sprays into the nose daily as needed. For allergies     . FUROSEMIDE 40 MG PO TABS Oral Take 40 mg by mouth 2 (two) times daily.     . IPRATROPIUM-ALBUTEROL 0.5-2.5 (3) MG/3ML IN SOLN Nebulization Take 3 mLs by nebulization 3 (three) times daily as needed. Shortness of breath.    Marland Kitchen LISINOPRIL 5 MG PO TABS Oral Take 5 mg by mouth daily.    Marland Kitchen OMEPRAZOLE 20 MG PO CPDR Oral Take 20 mg by mouth daily.      Marland Kitchen POTASSIUM CHLORIDE CRYS ER 20 MEQ PO TBCR Oral Take 20 mEq by mouth 2 (two) times daily.    Marland Kitchen PREGABALIN 75 MG PO CAPS Oral Take 75 mg by mouth 2 (two) times daily.      Marland Kitchen TIOTROPIUM BROMIDE MONOHYDRATE 18 MCG IN CAPS Inhalation Place 18 mcg into inhaler and inhale daily.      Marland Kitchen TIZANIDINE HCL 4 MG PO CAPS Oral Take 4 mg by mouth at bedtime.      . TRAMADOL HCL 50 MG PO TABS Oral Take 100 mg by mouth every 6 (six) hours as needed. Maximum dose= 8 tablets per day For pain    . TRIAMCINOLONE ACETONIDE 0.1 % EX CREA Topical Apply 1 application topically 2 (two) times daily as needed. Swelling in right arm.    Marland Kitchen TRIMETHOPRIM 100 MG PO TABS Oral Take 100 mg by  mouth daily.      BP 184/86  Pulse 96  Temp 98.5 F (36.9 C) (Oral)  Resp 22  SpO2 98%  Physical Exam  Nursing note and vitals reviewed. Constitutional: She appears well-developed and well-nourished. No distress.       Sitting up in bed. Anxious appearing. Mildly tremulous.  HENT:  Head: Normocephalic and atraumatic.  Eyes: Conjunctivae are normal. Right eye exhibits no discharge. Left eye exhibits no discharge.  Neck: Neck supple.  Cardiovascular: Normal rate, regular rhythm and normal heart sounds.  Exam reveals no gallop and no friction rub.   No murmur heard. Pulmonary/Chest: Effort normal. No respiratory distress.       Faint expiratory wheezing bilaterally.  Abdominal: Soft. She exhibits no distension. There is no tenderness.       Moderate diffuse tenderness without guarding. No distention. No masses palpated.  Genitourinary:       No CVA tenderness  Musculoskeletal: She exhibits no edema and no tenderness.  Neurological: She is alert.  Skin: Skin is  warm and dry.  Psychiatric: She has a normal mood and affect. Her behavior is normal. Thought content normal.    ED Course  Procedures (including critical care time)  Labs Reviewed  CARDIAC PANEL(CRET KIN+CKTOT+MB+TROPI) - Abnormal; Notable for the following:    Total CK 842 (*)     CK, MB 12.9 (*)     All other components within normal limits  CBC WITH DIFFERENTIAL - Abnormal; Notable for the following:    WBC 10.6 (*)     MCV 104.9 (*)     Monocytes Absolute 1.1 (*)     All other components within normal limits  COMPREHENSIVE METABOLIC PANEL - Abnormal; Notable for the following:    BUN 43 (*)     Creatinine, Ser 1.27 (*)     Calcium 7.4 (*)     GFR calc non Af Amer 39 (*)     GFR calc Af Amer 45 (*)     All other components within normal limits  URINALYSIS, ROUTINE W REFLEX MICROSCOPIC - Abnormal; Notable for the following:    APPearance CLOUDY (*)     Leukocytes, UA LARGE (*)     All other components within normal limits  POCT I-STAT, CHEM 8 - Abnormal; Notable for the following:    BUN 55 (*)     Creatinine, Ser 1.40 (*)     Calcium, Ion 0.95 (*)     All other components within normal limits  URINE MICROSCOPIC-ADD ON - Abnormal; Notable for the following:    Squamous Epithelial / LPF FEW (*)     All other components within normal limits  BLOOD GAS, ARTERIAL - Abnormal; Notable for the following:    pH, Arterial 7.321 (*)     pO2, Arterial 122.0 (*)     Acid-base deficit 5.2 (*)     All other components within normal limits  LIPASE, BLOOD  TROPONIN I  LACTIC ACID, PLASMA  PROCALCITONIN  URINE CULTURE  CULTURE, BLOOD (ROUTINE X 2)  CULTURE, BLOOD (ROUTINE X 2)   Ct Abdomen Pelvis W Contrast  02/02/2012  *RADIOLOGY REPORT*  Clinical Data: Abdominal pain  CT ABDOMEN AND PELVIS WITH CONTRAST  Technique:  Multidetector CT imaging of the abdomen and pelvis was performed following the standard protocol during bolus administration of intravenous contrast.  Contrast: 80mL  OMNIPAQUE IOHEXOL 300 MG/ML  SOLN  Comparison: 06/08/2011  Findings: Linear right lung base opacity.  Small hiatal hernia.  Mildly lobular liver contour is nonspecific.  There is a focal hypoattenuation adjacent the falciform ligament, nonspecific. Distended gallbladder.  Biliary ductal dilatation.  Unremarkable spleen.  Pancreas divisum. Bilateral adrenal gland nodules are unchanged.  Symmetric renal enhancement.  No hydronephrosis or hydroureter.  No bowel obstruction.  No CT evidence for colitis.  No free intraperitoneal air or fluid.  There is scattered atherosclerotic calcification of the aorta and its branches. No aneurysmal dilatation.  Thin-walled bladder.  Absent uterus.  No adnexal mass.  Multilevel degenerative changes of the imaged spine.  Osteopenia and multilevel fusion.  IMPRESSION: Distended gallbladder without overt inflammatory changes. Correlate with ultrasound if concerned for biliary pathology. There is CBD prominence to the level of the ampulla, similar to prior.  Low attenuation of the liver may reflect fatty infiltration.  Original Report Authenticated By: Waneta Martins, M.D.   EKG:  Rhythm: sinus tach w/ PACs Rate: 106 Axis: right Intervals/Conduction: LAE ST segments: NS ST changes   1. Abdominal pain   2. UTI (urinary tract infection)   3. Hypocalcemia       MDM  78 year old female with abdominal pain. History is very difficult to obtain from patient herself. She has a history of chronic pain, but this appears to be an acute change. Diffusely tender on exam without peritoneal signs. Plan labs, urine and CT of the abdomen pelvis to further evaluate.  2:57 AM When pt returned from CT now more tremulous, almost rigors. More tachypneic. o2 sats good on supplemental o2 via [place] but concerned for increased WOB so placed on BIPAP. Suspect some anxiety component. Lungs auscultation remains unchanged. CT with distended GB. Empiric abx and will [place] to eval for cholecystitis.     6:24 AM Source of abdominal pain may be UTI as UA is suggestive. CT with distended GB and same seen on [place] but no other findings to suggest cholecystitis. Procalcitonin markedly elevated concerning for sepsis. Bicarb low normal but with hx of COPD likely usually retains. Pt has subsequently weaned to nasal cannula but continues to appear anxious and uncomfortable with odd affect. Unclear how much from acute illness versus her baseline. CKMB elevated but trop x2 normal. Pt denies CP and I do not think symptoms cardiac in nature.  Raeford Razor, MD 02/02/12 [phone]

## 2012-02-02 NOTE — Progress Notes (Signed)
ANTIBIOTIC CONSULT NOTE - INITIAL  Pharmacy Consult for Zosyn Indication: Urosepsis  No Known Allergies  Patient Measurements: Height: 5\' 5"  (165.1 cm) Weight: 161 lb 9.6 oz (73.3 kg) IBW/kg (Calculated) : 57  Adjusted Body Weight:   Vital Signs: Temp: 98.8 F (37.1 C) (08/10 0901) Temp src: Oral (08/10 0901) BP: 124/79 mmHg (08/10 0901) Pulse Rate: 82  (08/10 0901) Intake/Output from previous day:   Intake/Output from this shift:    Labs:  [facility] 02/01/12 2145 02/01/12 2110  WBC -- 10.6*  HGB 15.0 13.2  PLT -- 240  LABCREA -- --  CREATININE 1.40* 1.27*   Estimated Creatinine Clearance: 32.7 ml/min (by C-G formula based on Cr of 1.4). No results found for this basename: VANCOTROUGH:2,VANCOPEAK:2,VANCORANDOM:2,GENTTROUGH:2,GENTPEAK:2,GENTRANDOM:2,TOBRATROUGH:2,TOBRAPEAK:2,TOBRARND:2,AMIKACINPEAK:2,AMIKACINTROU:2,AMIKACIN:2, in the last 72 hours   Microbiology: No results found for this or any previous visit (from the past 720 hour(s)).  Medical History: Past Medical History  Diagnosis Date  . Coronary artery disease   . Cancer rt breast  . History of right mastectomy   . Anxiety   . Smoking     Medications:    Assessment: 82 yoF admitted with lower abdominal pain started on empiric zosyn for urosepsis.    PCT 32.25  SCr 1.27-->1.40, CrCl ~32 ml/min (43N)  Blood cultures, urine cultures pending  Tm24h: 98.8  WBC - N/A  Plan:  1.  Continue Zosyn 3.375g IV q 8 hours 2.  F/u renal fxn, cultures, clinical course  Mendi Constable E 02/02/2012,9:18 AM

## 2012-02-02 NOTE — ED Notes (Signed)
Unable to obtain a 2nd site for 2nd bld culture, pt has limited access. RN aware.

## 2012-02-02 NOTE — H&P (Addendum)
[facility] History and Physical  Theresa Blackwell BJY:[id number] DOB: 1931-03-11 DOA: 02/01/2012  Referring physician: ED physician PCP: Dr. Georgann Housekeeper   Chief Complaint: Abdominal pain and weakness  HPI:  Pt is 78 yo female who presents to [facility] ED with main concern of progressively worsening generalized abdominal pain, mostly intermittent in nature, non radiating and associated with poor oral intake, subjective fevers, chills. Abdominal pain is rather chronic in nature per family member at bedside but fevers, chills, and generalized weakness is somewhat new and started several days prior to admission. Pt feels tired to provide detailed history and most of this is obtained from son who is present at bedside. He say that mother has been weaker and has not had much of an appetite and was concerned with urinary urgency and frequency.  Assessment and Plan:  Principal Problem:  *Abdominal pain - unclear etiology at this time but appears to be multifactorial in etiology secondary to UTI, ? Distended gallbladder and CBD etiology - I called GI for further recommendations on acute need of ERCP vs MRCP, awaiting answer - will admit to telemetry floor and provide supportive care - will also treat for UTI with Zosyn  Active Problems:  Hypokalemia - will supplement as indicated - BMP in AM   COPD (chronic obstructive pulmonary disease) with acute bronchitis - this appears to be clinically compensated - will monitor vitals per floor protocol   CHF (congestive heart failure) - will hold Lasix temporarily as pt appears clinically dry but will continue gentle hydration for several hours - assess clinical status in Am to determine if Lasix can be restarted   Urosepsis - will treat with antibiotic broad spectrum - follow upon urine and blood cultures   Acute on chronic renal failure - likely of pre renal etiology imposed on chronic kidney disease - gentle hydration for now and hold Lasix  temporarily only - BMP in AM   Code Status: Full Family Communication: Pt at bedside Disposition Plan: PT evaluation once pt able to tolerate, alert primary doctor of pt's arrival   Review of Systems:  Constitutional: Positive for fever, chills and malaise/fatigue. Negative for diaphoresis.  HENT: Negative for hearing loss, ear pain, nosebleeds, congestion, sore throat, neck pain, tinnitus and ear discharge.   Eyes: Negative for blurred vision, double vision, photophobia, pain, discharge and redness.  Respiratory: Negative for cough, hemoptysis, sputum production, shortness of breath, wheezing and stridor.   Cardiovascular: Negative for chest pain, palpitations, orthopnea, claudication and leg swelling.  Gastrointestinal: Positive for nausea, and abdominal pain. Negative for heartburn, constipation, blood in stool and melena.  Genitourinary: Positive for urgency, frequency, negative for hematuria and flank pain.  Musculoskeletal: Negative for myalgias, back pain, joint pain and falls.  Skin: Negative for itching and rash.  Neurological: Negative for dizziness, positive for generalized weakness. Negative for tingling, tremors, sensory change, speech change, focal weakness, LOS, and headaches.  Endo/Heme/Allergies: Negative for environmental allergies and polydipsia. Does not bruise/bleed easily.  Psychiatric/Behavioral: Negative for suicidal ideas. The patient is not nervous/anxious.      Past Medical History  Diagnosis Date  . Coronary artery disease   . Cancer rt breast  . History of right mastectomy   . Anxiety   . Smoking     Past Surgical History  Procedure Date  . Breast surgery     Social History:  reports that she has quit smoking. She does not have any smokeless tobacco history on file. Her alcohol and drug histories  not on file.  No Known Allergies  No family history of cancers or heart disease.   Medication Sig  acetaminophen-codeine (TYLENOL #3) 300-30 MG per  tablet Take 1 tablet by mouth 2 (two) times daily as needed. For pain   albuterol (PROVENTIL HFA;VENTOLIN HFA) 108 (90 BASE) MCG/ACT inhaler Inhale 2 puffs into the lungs every 4 (four) hours as needed. Shortness of breath   citalopram (CELEXA) 20 MG tablet Take 10 mg by mouth daily.   ergocalciferol (VITAMIN D2) 50000 UNITS capsule Take 50,000 Units by mouth every 14 (fourteen) days.   fluticasone (FLONASE) 50 MCG/ACT nasal spray Place 2 sprays into the nose daily as needed. For allergies   furosemide (LASIX) 40 MG tablet Take 40 mg by mouth 2 (two) times daily.  ipratropium-albuterol (DUONEB) 0.5-2.5 (3) MG/3ML SOLN Take 3 mLs by nebulization 3 (three) times daily as needed. Shortness of breath.  lisinopril (PRINIVIL,ZESTRIL) 5 MG tablet Take 5 mg by mouth daily.  omeprazole (PRILOSEC) 20 MG capsule Take 20 mg by mouth daily.    potassium chloride SA (K-DUR,KLOR-CON) 20 MEQ tablet Take 20 mEq by mouth 2 (two) times daily.  pregabalin (LYRICA) 75 MG capsule Take 75 mg by mouth 2 (two) times daily.    tiotropium (SPIRIVA) 18 MCG inhalation capsule Place 18 mcg into inhaler and inhale daily.    tiZANidine (ZANAFLEX) 4 MG capsule Take 4 mg by mouth at bedtime.    traMADol (ULTRAM) 50 MG tablet Take 100 mg by mouth every 6 (six) hours as needed. Maximum dose= 8 tablets per day For pain  triamcinolone cream (KENALOG) 0.1 % Apply 1 application topically 2 (two) times daily as needed. Swelling in right arm.  trimethoprim (TRIMPEX) 100 MG tablet Take 100 mg by mouth daily.    Physical Exam: Filed Vitals:   02/02/12 0700 02/02/12 0715 02/02/12 0723 02/02/12 0730  BP:   132/59   Pulse: 96 94  85  Temp:   98.6 F (37 C)   TempSrc:   Oral   Resp: 27 24 18 23   SpO2: 96% 98% 95% 95%    Physical Exam  Constitutional: Appears somewhat somnolent but when called by name alert and follows commands appropriately  HENT: Normocephalic. External right and left ear normal. Dry MM  Eyes: Conjunctivae and  EOM are normal. PERRLA, no scleral icterus.  Neck: Normal ROM. Neck supple. No JVD. No tracheal deviation. No thyromegaly.  CVS: IRRR, no murmurs, no gallops, no carotid bruit.  Pulmonary: Effort and breath sounds normal but slightly diminished at bases, no stridor, rhonchi, wheezes, rales.  Abdominal: Soft. BS +,  Mildly distension, diffuse tenderness, no rebound or guarding.  Musculoskeletal: Normal range of motion. No edema and no tenderness.  Lymphadenopathy: No lymphadenopathy noted, cervical, inguinal. Neuro: Normal reflexes, muscle tone coordination. No cranial nerve deficit. Skin: Skin is warm and dry. No rash noted. Not diaphoretic. No erythema. No pallor.  Psychiatric: Normal mood and affect. Behavior, judgment, thought content normal.   Labs on Admission:  Basic Metabolic Panel:  Lab 02/01/12 1610 02/01/12 2110  NA 139 139  K 4.4 4.4  CL 105 101  CO2 -- 23  GLUCOSE 84 82  BUN 55* 43*  CREATININE 1.40* 1.27*  CALCIUM -- 7.4*  MG -- --  PHOS -- --   Liver Function Tests:  Lab 02/01/12 2110  AST 36  ALT 16  ALKPHOS 67  BILITOT 0.5  PROT 7.0  ALBUMIN 3.7    Lab 02/01/12 2110  LIPASE  41  AMYLASE --   CBC:  Lab 02/01/12 2145 02/01/12 2110  WBC -- 10.6*  NEUTROABS -- 7.1  HGB 15.0 13.2  HCT 44.0 40.8  MCV -- 104.9*  PLT -- 240   Cardiac Enzymes:  Lab 02/02/12 0030 02/01/12 2110  CKTOTAL -- 842*  CKMB -- 12.9*  CKMBINDEX -- --  TROPONINI <0.30 <0.30   Radiological Exams on Admission: US Abdomen Complete 02/02/2012   1. Distended gallbladder however no radiodense stones or wall thickening.   2. Otherwise, technically challenging examination as there was excessive bowel gas resulting in limited acoustic windows, and as the patient was sedated, limited ability to reposition.  3. Given the prominence of the CBD and main pancreatic duct, ERCP or MRCP follow-up should be considered.    Ct Abdomen Pelvis W Contrast 02/02/2012    1. Distended gallbladder  without overt inflammatory changes.  2. Correlate with ultrasound if concerned for biliary pathology.  3. There is CBD prominence to the level of the ampulla, similar to prior.   4. Low attenuation of the liver may reflect fatty infiltration.   CXR 02/02/2012 1.  Low lung volumes without definite radiographic evidence of acute cardiopulmonary disease.  2.  There is soft tissue prominence in the right hilar region. This may be artifactual related to patient positioning, however, underlying hilar adenopathy or mass is not excluded.   3.  Further evaluation with a properly centered standing PA and lateral chest radiograph is recommended when the patient is able.  4.  Atherosclerosis.    EKG: Normal sinus rhythm, no ST/T wave changes  Debbora Presto, MD  [facility] Pager [phone]  If 7PM-7AM, please contact night-coverage [email or web address] Password [email or web address] 02/02/2012, 8:00 AM

## 2012-02-03 ENCOUNTER — Inpatient Hospital Stay (HOSPITAL_COMMUNITY): Payer: Medicare Other

## 2012-02-03 DIAGNOSIS — R932 Abnormal findings on diagnostic imaging of liver and biliary tract: Secondary | ICD-10-CM

## 2012-02-03 DIAGNOSIS — R109 Unspecified abdominal pain: Secondary | ICD-10-CM

## 2012-02-03 LAB — URINE CULTURE: Colony Count: >=100000

## 2012-02-03 LAB — CBC
HCT: 31.1 % — ABNORMAL LOW (ref 36.0–46.0)
Hemoglobin: 10.1 g/dL — ABNORMAL LOW (ref 12.0–15.0)
MCHC: 32.5 g/dL (ref 30.0–36.0)
RDW: 14.1 % (ref 11.5–15.5)
WBC: 5 10*3/uL (ref 4.0–10.5)

## 2012-02-03 LAB — BASIC METABOLIC PANEL
BUN: 16 mg/dL (ref 6–23)
Chloride: 106 mEq/L (ref 96–112)
GFR calc Af Amer: >90 mL/min (ref >90)
GFR calc non Af Amer: 80 mL/min — ABNORMAL LOW (ref >90)
Potassium: 3.7 mEq/L (ref 3.5–5.1)

## 2012-02-03 LAB — CARDIAC PANEL(CRET KIN+CKTOT+MB+TROPI)
CK, MB: 6.3 ng/mL (ref 0.3–4.0)
Relative Index: 1.6 (ref 0.0–2.5)
Total CK: 388 U/L — ABNORMAL HIGH (ref 7–177)
Troponin I: <0.3 ng/mL (ref <0.30)

## 2012-02-03 LAB — MAGNESIUM: Magnesium: 1.5 mg/dL (ref 1.5–2.5)

## 2012-02-03 LAB — LIPASE, BLOOD: Lipase: 40 U/L (ref 11–59)

## 2012-02-03 MED ORDER — GADOBENATE DIMEGLUMINE 529 MG/ML IV SOLN
15.0000 mL | Freq: Once | INTRAVENOUS | Status: AC | PRN
  Administered 2012-02-03: 15 mL via INTRAVENOUS

## 2012-02-03 NOTE — Progress Notes (Addendum)
Cm spoke with concerning dc planning.Pt states grandson lives with her. Adult son provides tx, assist with care. Pt request [facility] provide HH services upon discharge. Patient request HHRN/PT. Patient uses agency for home oxygen therapy. [facility] notified of referral. H/P, demographics faxed to [phone]. Patient  [facility] in [place]. PCP PCP: Dr. Georgann Housekeeper. No other needs specified at this time.   Theresa Blackwell [phone]

## 2012-02-03 NOTE — Progress Notes (Signed)
Pt has complained of a severe headache all night. Gave patient PRN ultram 100mg   and PRN morphine 1mg  IV. Pt had little relief. NP notified and ordered tylenol x 1. Pt still has not had much relief. Pt has also had low bp's of 70's/40's and was given two 500cc boluses. BP is now 90's/50's. NP reluctant to try other narcotics  due to low bp. Ice Veterinary surgeon given. Will continue to monitor pt.

## 2012-02-03 NOTE — Progress Notes (Signed)
Patient ID: Theresa Blackwell, female   DOB: Jul 05, 1930, 78 y.o.   MRN: [id number] [facility] Progress Note  Subjective: Still c/o nausea, and abdominal discomfort- tried to eat this am, makes her queasy. She is also having left shoulder pain and a headache  Objective:  Vital signs in last 24 hours: Temp:  [97.4 F (36.3 C)-98.1 F (36.7 C)] 98.1 F (36.7 C) (08/11 0541) Pulse Rate:  [66-83] 69  (08/11 0541) Resp:  [18-22] 18  (08/11 0541) BP: (72-118)/(41-73) 100/58 mmHg (08/11 0541) SpO2:  [93 %-100 %] 94 % (08/11 0814) Weight:  [168 lb 6.9 oz (76.4 kg)-169 lb 5 oz (76.8 kg)] 168 lb 6.9 oz (76.4 kg) (08/11 0953) Last BM Date: 02/02/12 General:   Alert,  Well-developed,    in NAD Heart:  Regular rate and rhythm; no murmurs Pulm;decreased bs bilaterally Abdomen:  Soft, tender  Epigastrium and ruq, no guarding Extremities:  Without edema. Neurologic:  Alert and  oriented x4;  grossly normal neurologically,some tremor Psych:  Alert and cooperative. Normal mood and affect.  Intake/Output from previous day: 08/10 0701 - 08/11 0700 In: 1230 [P.O.:480; I.V.:600; IV Piggyback:150] Out: 400 [Urine:400] Intake/Output this shift: Total I/O In: 140 [P.O.:140] Out: 200 [Urine:200]  Lab Results:  [facility] 02/03/12 0520 02/01/12 2145 02/01/12 2110  WBC 5.0 -- 10.6*  HGB 10.1* 15.0 13.2  HCT 31.1* 44.0 40.8  PLT 157 -- 240   BMET  Basename 02/03/12 0520 02/01/12 2145 02/01/12 2110  NA 137 139 139  K 3.7 4.4 4.4  CL 106 105 101  CO2 24 -- 23  GLUCOSE 99 84 82  BUN 16 55* 43*  CREATININE 0.71 1.40* 1.27*  CALCIUM 7.2* -- 7.4*   LFT  Basename 02/02/12 1105  PROT 5.8*  ALBUMIN 3.0*  AST 28  ALT 14  ALKPHOS 59  BILITOT 0.6  BILIDIR 0.2  IBILI 0.4     Assessment / Plan:   #1  78 yo female with RUQ/epigastric pain and nausea- distended GB on imaging with stones or wall thickening. She has pancreas divisum,a dilated CBD and Panc duct with normal LFT's MRCP has  been ordered CA19-9 normal  Furhter plans pending MRCP  #2 UTI- on zosyn #3 CHF #4 COPD Principal Problem:  *Abdominal pain Active Problems:  Hypokalemia  COPD (chronic obstructive pulmonary disease) with acute bronchitis  CHF (congestive heart failure)  Urosepsis  Acute on chronic renal failure     LOS: 2 days   Amy Esterwood  02/03/2012, 9:54 AM  I have reviewed the MRCP from this morning which does not show any mass effect in the pancreas or at the porta hepatis. CBD is 13 mm and she has a known Pancreas divisum. Since her LFT's, Amy/lipase and Ca19-9 is normal I would not recommend ERCP but would schedule an outpatient EUS with DR Christella Hartigan. Will will help to facilitate it.

## 2012-02-03 NOTE — Progress Notes (Signed)
Subjective: Patient still complains of right upper quadrant pain  Objective: Vital signs in last 24 hours: Temp:  [97.4 F (36.3 C)-98.8 F (37.1 C)] 98.1 F (36.7 C) (08/11 0541) Pulse Rate:  [66-83] 69  (08/11 0541) Resp:  [18-24] 18  (08/11 0541) BP: (72-124)/(41-79) 100/58 mmHg (08/11 0541) SpO2:  [93 %-100 %] 93 % (08/11 0541) Weight:  [73.3 kg (161 lb 9.6 oz)-76.8 kg (169 lb 5 oz)] 76.8 kg (169 lb 5 oz) (08/11 0541) Weight change:  Last BM Date:  (unknown)  Intake/Output from previous day: 08/10 0701 - 08/11 0700 In: 1230 [P.O.:480; I.V.:600; IV Piggyback:150] Out: 400 [Urine:400] Intake/Output this shift:    Resp: clear to auscultation bilaterally Cardio: regular rate and rhythm, S1, S2 normal, no murmur, click, rub or gallop GI: bowel sounds active, abdomin soft no mass mild ruq pain  Lab Results:  Basename 02/03/12 0520 02/01/12 2145 02/01/12 2110  WBC 5.0 -- 10.6*  HGB 10.1* 15.0 --  HCT 31.1* 44.0 --  PLT 157 -- 240   BMET  Basename 02/03/12 0520 02/01/12 2145 02/01/12 2110  NA 137 139 --  K 3.7 4.4 --  CL 106 105 --  CO2 24 -- 23  GLUCOSE 99 84 --  BUN 16 55* --  CREATININE 0.71 1.40* --  CALCIUM 7.2* -- 7.4*    Studies/Results: Dg Chest 2 View  02/02/2012  *RADIOLOGY REPORT*  Clinical Data: Possible pulmonary nodule.  CHEST - 2 VIEW  Comparison: Earlier same day; 06/05/2011; 05/30/2005, 04/08/2008; chest CT - 06/05/2011  Findings:  Grossly unchanged cardiac silhouette and mediastinal contours with atherosclerotic calcifications within the thoracic aorta. There is unchanged prominence of the right pulmonary hila, grossly stable since the 03/2008 examination.  There is grossly unchanged relative increased lucency of the right hemithorax secondary to prior right sided mastectomy. No focal airspace opacities.  No pleural effusion or pneumothorax.  Grossly unchanged bones.  IMPRESSION:  1.  Unchanged prominence of the right pulmonary hilum, stable since the  03/2008 examination and favored to be secondary to mild prominence of the right pulmonary artery as demonstrated on prior chest CT.  2.  No acute cardiopulmonary disease.  Post right-sided mastectomy  Original Report Authenticated By: Waynard Reeds, M.D.   US Abdomen Complete  02/02/2012  *RADIOLOGY REPORT*  Clinical Data:  Abdominal pain  COMPLETE ABDOMINAL ULTRASOUND  Comparison:  02/02/2012  Findings:  Gallbladder:  The gallbladder is distended however no stones or sludge.  No wall thickening.  Common bile duct:  Measures 9 mm, prominent.  The distal duct is obscured.  Liver:  Poorly visualized due to limited acoustic windows. Suggestion of hepatic steatosis.  IVC:  Appears normal.  Pancreas:  Poorly visualized.  The main pancreatic duct is mildly prominent at 5 mm.  Spleen:  Measures 10 cm, no focal abnormality.  Right Kidney:  Measures 12.2 cm.  No hydronephrosis or focal abnormality.  Left Kidney:  Measures 11.5 cm.  No hydronephrosis or focal abnormality.  Abdominal aorta:  No aneurysm identified.  IMPRESSION:  Distended gallbladder however no radiodense stones or wall thickening.  Otherwise, technically challenging examination as there was excessive bowel gas resulting in limited acoustic windows, and as the patient was sedated, limited ability to reposition.  Given the prominence of the CBD and main pancreatic duct, ERCP or MRCP follow-up should be considered.  Original Report Authenticated By: Waneta Martins, M.D.   Ct Abdomen Pelvis W Contrast  02/02/2012  *RADIOLOGY REPORT*  Clinical Data:  Abdominal pain  CT ABDOMEN AND PELVIS WITH CONTRAST  Technique:  Multidetector CT imaging of the abdomen and pelvis was performed following the standard protocol during bolus administration of intravenous contrast.  Contrast: 80mL OMNIPAQUE IOHEXOL 300 MG/ML  SOLN  Comparison: 06/08/2011  Findings: Linear right lung base opacity.  Small hiatal hernia.  Mildly lobular liver contour is nonspecific.  There is  a focal hypoattenuation adjacent the falciform ligament, nonspecific. Distended gallbladder.  Biliary ductal dilatation.  Unremarkable spleen.  Pancreas divisum. Bilateral adrenal gland nodules are unchanged.  Symmetric renal enhancement.  No hydronephrosis or hydroureter.  No bowel obstruction.  No CT evidence for colitis.  No free intraperitoneal air or fluid.  There is scattered atherosclerotic calcification of the aorta and its branches. No aneurysmal dilatation.  Thin-walled bladder.  Absent uterus.  No adnexal mass.  Multilevel degenerative changes of the imaged spine.  Osteopenia and multilevel fusion.  IMPRESSION: Distended gallbladder without overt inflammatory changes. Correlate with ultrasound if concerned for biliary pathology. There is CBD prominence to the level of the ampulla, similar to prior.  Low attenuation of the liver may reflect fatty infiltration.  Original Report Authenticated By: Waneta Martins, M.D.   Dg Chest Port 1 View  02/02/2012  *RADIOLOGY REPORT*  Clinical Data: Shortness of breath, cough and epigastric pain.  PORTABLE CHEST - 1 VIEW  Comparison: Chest x-ray 06/05/2011.  Findings: There is increased lucency over the right hemithorax most likely related to patient's history of right-sided mastectomy. Lung volumes are low. The patient is rotated to the right on today's exam, resulting in distortion of the mediastinal contours and reduced diagnostic sensitivity and specificity for mediastinal pathology.  Heart size is upper limits of normal.  No definite consolidative airspace disease.  No definite pleural effusions.  No evidence of pulmonary edema.  Atherosclerosis in the thoracic aorta.  Soft tissue prominence in the right hilar region.  IMPRESSION: 1.  Low lung volumes without definite radiographic evidence of acute cardiopulmonary disease. 2.  There is soft tissue prominence in the right hilar region. This may be artifactual related to patient positioning, however, underlying  hilar adenopathy or mass is not excluded.  Further evaluation with a properly centered standing PA and lateral chest radiograph is recommended when the patient is able. 3.  Atherosclerosis.  Original Report Authenticated By: Florencia Reasons, M.D.    Medications:  Scheduled:   . acetaminophen      . acetaminophen  650 mg Oral Once  . antiseptic oral rinse  15 mL Mouth Rinse BID  . citalopram  10 mg Oral Daily  . dextrose 5 % 100 mL with magnesium sulfate 4 g infusion   Intravenous Once  . enoxaparin (LOVENOX) injection  30 mg Subcutaneous Q24H  . fluticasone  2 spray Each Nare Daily  . magnesium sulfate 1 - 4 g bolus IVPB  4 g Intravenous Once  . pantoprazole  40 mg Oral Q1200  . piperacillin-tazobactam (ZOSYN)  IV  3.375 g Intravenous Q8H  . pregabalin  75 mg Oral BID  . sodium chloride  500 mL Intravenous Once  . sodium chloride  500 mL Intravenous Once  . sodium chloride  3 mL Intravenous Q12H  . tiotropium  18 mcg Inhalation Daily  . tiZANidine  4 mg Oral QHS  . trimethoprim  100 mg Oral Daily  . DISCONTD: sodium chloride   Intravenous STAT  . DISCONTD: magnesium sulfate 1 - 4 g bolus IVPB  4 g Intravenous Once  .  DISCONTD: tiZANidine  4 mg Oral QHS    Assessment/Plan:  1. Abdominal pain with normal LFT, ca125, amylase and lipase, however ca 19-9 elevated 5.1 and dialated billiary system on ct and ultrasound. I will order MRCP  LOS: 2 days   [facility] 02/03/2012, 7:57 AM

## 2012-02-04 ENCOUNTER — Telehealth: Payer: Self-pay

## 2012-02-04 ENCOUNTER — Inpatient Hospital Stay (HOSPITAL_COMMUNITY): Payer: Medicare Other

## 2012-02-04 ENCOUNTER — Encounter (HOSPITAL_COMMUNITY): Payer: Self-pay | Admitting: General Surgery

## 2012-02-04 DIAGNOSIS — R109 Unspecified abdominal pain: Secondary | ICD-10-CM

## 2012-02-04 LAB — CBC WITH DIFFERENTIAL/PLATELET
Basophils Relative: 0 % (ref 0–1)
Eosinophils Absolute: 0 10*3/uL (ref 0.0–0.7)
Lymphs Abs: 1.8 10*3/uL (ref 0.7–4.0)
MCH: 34.2 pg — ABNORMAL HIGH (ref 26.0–34.0)
Neutro Abs: 2.7 10*3/uL (ref 1.7–7.7)
Neutrophils Relative %: 53 % (ref 43–77)
Platelets: 176 10*3/uL (ref 150–400)
RBC: 2.92 MIL/uL — ABNORMAL LOW (ref 3.87–5.11)

## 2012-02-04 MED ORDER — TECHNETIUM TC 99M MEBROFENIN IV KIT
4.5000 | PACK | Freq: Once | INTRAVENOUS | Status: AC | PRN
  Administered 2012-02-04: 5 via INTRAVENOUS

## 2012-02-04 MED ORDER — POTASSIUM CHLORIDE ER 10 MEQ PO TBCR: 10.0000 meq | EXTENDED_RELEASE_TABLET | Freq: Every day | ORAL | Status: DC

## 2012-02-04 MED ORDER — FUROSEMIDE 40 MG PO TABS: 40.0000 mg | ORAL_TABLET | Freq: Every day | ORAL | Status: DC

## 2012-02-04 NOTE — Care Management Note (Unsigned)
    Page 1 of 2   02/04/2012     4:12:46 PM   CARE MANAGEMENT NOTE 02/04/2012  Patient:  Theresa Blackwell, Theresa Blackwell   Account Number:  [id number]  Date Initiated:  02/03/2012  Documentation initiated by:  DAVIS,TYMEEKA  Subjective/Objective Assessment:   78 yo female admitted with ABD pain. PTA pt lived independently home with adult grandson.     Action/Plan:   home when stable   Anticipated DC Date:  02/05/2012   Anticipated DC Plan:  HOME W HOME HEALTH SERVICES  In-house referral  NA      DC Planning Services  CM consult      [facility] Choice  HOME HEALTH   Choice offered to / List presented to:  C-1 Patient   DME arranged  NA      DME agency  NA     HH arranged  HH-2 PT  HH-1 RN      [facility] agency  [facility]   Status of service:   Medicare Important Message given?   (If response is "NO", the following Medicare IM given date fields will be blank) Date Medicare IM given:   Date Additional Medicare IM given:    Discharge Disposition:    Per UR Regulation:    If discussed at Long Length of Stay Meetings, dates discussed:    Comments:  02/04/12 Tej Murdaugh RN,BSN NCM [phone] [facility] S[facility]AN [facility] FOLLOWING FOR HH.RECOMMEND PT EVAL.RECOMMEND HHRN/PT.WILL NEED HH ORDERS, & FACE TO FACE IF MD AGREE.  02/03/12 1710 Tymeeka Davis,Rn,BSN [phone] Cm spoke with concerning dc planning.Pt states grandson lives with her. Adult son provides tx, assist with care. Pt request [facility] provide HH services upon discharge. Patient request HHRN/PT. Patient uses agency for home oxygen therapy. [facility] notified of referral. H/P, demographics faxed to [phone]. Patient  [facility] in [place]. PCP PCP: Dr. Georgann Housekeeper. No other needs specified at this time.

## 2012-02-04 NOTE — Progress Notes (Signed)
Subjective: Pt still c/p RUQ pain With some nausea/ and upper back pain MRCP- - no acute finding CBD mild dilated Intermittent abd pain over last 1 yr   Objective: Vital signs in last 24 hours: Temp:  [97.4 F (36.3 C)-97.9 F (36.6 C)] 97.9 F (36.6 C) (08/12 4098) Pulse Rate:  [65-81] 65  (08/12 0608) Resp:  [18-20] 20  (08/12 0608) BP: (105-145)/(60-75) 125/71 mmHg (08/12 0608) SpO2:  [94 %-96 %] 94 % (08/12 0608) Weight:  [76.4 kg (168 lb 6.9 oz)-76.8 kg (169 lb 5 oz)] 76.8 kg (169 lb 5 oz) (08/12 1191) Weight change: 3.1 kg (6 lb 13.4 oz) Last BM Date: 02/03/12  Intake/Output from previous day: 08/11 0701 - 08/12 0700 In: 1250 [P.O.:1100; IV Piggyback:150] Out: 1401 [Urine:1400; Stool:1] Intake/Output this shift:    General appearance: alert Resp: clear to auscultation bilaterally Cardio: regular rate and rhythm GI: soft RUQ tenderness BS ok  Lab Results:  Basename 02/04/12 0432 02/03/12 0520  WBC 5.0 5.0  HGB 10.0* 10.1*  HCT 30.9* 31.1*  PLT 176 157   BMET  Basename 02/03/12 0520 02/01/12 2145 02/01/12 2110  NA 137 139 --  K 3.7 4.4 --  CL 106 105 --  CO2 24 -- 23  GLUCOSE 99 84 --  BUN 16 55* --  CREATININE 0.71 1.40* --  CALCIUM 7.2* -- 7.4*    Studies/Results: Dg Chest 2 View  02/02/2012  *RADIOLOGY REPORT*  Clinical Data: Possible pulmonary nodule.  CHEST - 2 VIEW  Comparison: Earlier same day; 06/05/2011; 05/30/2005, 04/08/2008; chest CT - 06/05/2011  Findings:  Grossly unchanged cardiac silhouette and mediastinal contours with atherosclerotic calcifications within the thoracic aorta. There is unchanged prominence of the right pulmonary hila, grossly stable since the 03/2008 examination.  There is grossly unchanged relative increased lucency of the right hemithorax secondary to prior right sided mastectomy. No focal airspace opacities.  No pleural effusion or pneumothorax.  Grossly unchanged bones.  IMPRESSION:  1.  Unchanged prominence of the  right pulmonary hilum, stable since the 03/2008 examination and favored to be secondary to mild prominence of the right pulmonary artery as demonstrated on prior chest CT.  2.  No acute cardiopulmonary disease.  Post right-sided mastectomy  Original Report Authenticated By: Waynard Reeds, M.D.   Mr 3d Recon At Scanner  02/03/2012  *RADIOLOGY REPORT*  Clinical Data:  Dilated biliary system.  Fever and chills.  MRI ABDOMEN WITHOUT AND WITH CONTRAST (MRCP)  Technique:  Multiplanar multisequence MR imaging of the abdomen was performed without and with contrast, including heavily T2-weighted images of the biliary and pancreatic ducts.  Three-dimensional MR images were rendered by post processing of the original MR data.  Contrast: 15mL MULTIHANCE GADOBENATE DIMEGLUMINE 529 MG/ML IV SOLN,  Comparison:  02/02/2012  Findings:  Mildly elevated left hemidiaphragm observed, with linear subsegmental atelectasis suggested in both lower lobes.  Trace perihepatic ascites noted with a small amount of fluid adjacent to the gallbladder.  Despite efforts by the patient and technologist, motion artifact is present on some series of today's examination and could not be totally eliminated.  This reduces diagnostic sensitivity and specificity.  Pancreas divisum is suspected. 3 mm T2 hyperintense structure anteriorly in the pancreatic body on image 19 of series 12 appears to been present back in 2006 and is considered benign.  The common bile duct measures up to 1.3 cm.  The 3-D reconstructed images suggest conical tapering of the distal CBD in the vicinity of the  ampulla, as on image 20 of series 6.  Some of the directly acquired coronal images, such as image 21 of series 11, suggest more of a distally truncated appearance, but this is probably due to the degree of motion artifact which that is almost all series.  No significant intrahepatic biliary dilatation noted.  No edema or irregularity of the common bile duct is noted.  The  gallbladder is flipped up anterior to the right hepatic lobe as on multiple prior exams.  No gallbladder wall thickening is observed.  The spleen appears normal.  Small right adrenal adenoma is stable.  IMPRESSION:  1. Chronic mild extrahepatic biliary dilatation, without beading, irregularity, or filling defect observed.  Sensitivity for subtle findings is reduced by significant motion artifact on most series. 2.  Pancreas divisum.  No specific findings of acute pancreatitis. 3.  Trace perihepatic ascites, etiology uncertain. 4.  Mildly distended gallbladder but without gallbladder wall thickening. 5.  Mild atelectasis in both lung bases. 6.  Slightly elevated left hemidiaphragm.  7.  Stable right adrenal adenoma.  Original Report Authenticated By: Dellia Cloud, M.D.   Mr Abd W/wo Cm/mrcp  02/03/2012  *RADIOLOGY REPORT*  Clinical Data:  Dilated biliary system.  Fever and chills.  MRI ABDOMEN WITHOUT AND WITH CONTRAST (MRCP)  Technique:  Multiplanar multisequence MR imaging of the abdomen was performed without and with contrast, including heavily T2-weighted images of the biliary and pancreatic ducts.  Three-dimensional MR images were rendered by post processing of the original MR data.  Contrast: 15mL MULTIHANCE GADOBENATE DIMEGLUMINE 529 MG/ML IV SOLN,  Comparison:  02/02/2012  Findings:  Mildly elevated left hemidiaphragm observed, with linear subsegmental atelectasis suggested in both lower lobes.  Trace perihepatic ascites noted with a small amount of fluid adjacent to the gallbladder.  Despite efforts by the patient and technologist, motion artifact is present on some series of today's examination and could not be totally eliminated.  This reduces diagnostic sensitivity and specificity.  Pancreas divisum is suspected. 3 mm T2 hyperintense structure anteriorly in the pancreatic body on image 19 of series 12 appears to been present back in 2006 and is considered benign.  The common bile duct measures  up to 1.3 cm.  The 3-D reconstructed images suggest conical tapering of the distal CBD in the vicinity of the ampulla, as on image 20 of series 6.  Some of the directly acquired coronal images, such as image 21 of series 11, suggest more of a distally truncated appearance, but this is probably due to the degree of motion artifact which that is almost all series.  No significant intrahepatic biliary dilatation noted.  No edema or irregularity of the common bile duct is noted.  The gallbladder is flipped up anterior to the right hepatic lobe as on multiple prior exams.  No gallbladder wall thickening is observed.  The spleen appears normal.  Small right adrenal adenoma is stable.  IMPRESSION:  1. Chronic mild extrahepatic biliary dilatation, without beading, irregularity, or filling defect observed.  Sensitivity for subtle findings is reduced by significant motion artifact on most series. 2.  Pancreas divisum.  No specific findings of acute pancreatitis. 3.  Trace perihepatic ascites, etiology uncertain. 4.  Mildly distended gallbladder but without gallbladder wall thickening. 5.  Mild atelectasis in both lung bases. 6.  Slightly elevated left hemidiaphragm.  7.  Stable right adrenal adenoma.  Original Report Authenticated By: Dellia Cloud, M.D.    Medications: I have reviewed the patient's current medications.  Assessment/Plan: Abdominal pain: CT / u/s - suggest distended GB- with Clinial symptoms-  Labs normal MRCP- no Obstruction Still could be diseased GB-  Possible need Eval for surgury to remove GB HiDA scan  Surgery consult COPD- stable HTN stable Chronic Cystitis- culture- mixed- on zosyn IV Chronic pain- OA Depression- stable ARF- Dehydration resolved   LOS: 3 days   Sharvi Mooneyhan 02/04/2012, 7:41 AM

## 2012-02-04 NOTE — Consult Note (Signed)
Patient seen and examined and chart reviewed. She c/o abd pain, mainly across lower abdomen, associated with some nausea. Has been getting worse over several weeks. Does not appear to have RUQ or epigastric pain.  Patient appears completely comfortable and pain free. She has no fever and her abdomen is soft, non-tender, and no masses or organomegaly No guarding. BS normal.  Labs noted and appear normal. GB normal on sono - maybe a little dilated - HIDA today normal  Has EUS scheduled.  Imp abd pain  Rec: at this point see no indication for cholecystectomy and no other surgical disease.  Will follow for additional w/u

## 2012-02-04 NOTE — Telephone Encounter (Signed)
Message copied by Donata Duff on Mon Feb 04, 2012  8:42 AM ------      Message from: Rob Bunting P      Created: Mon Feb 04, 2012  8:22 AM       Alexia Freestone,            This woman needs to be scheduled for new GI appt with me for abd pain, slightly dilated CBD.  She is currently an inpatient at [facility], should be discharged soon, hospitalist had asked about EUS but I think meeting her in office first since she has no GI MD is best.             [name],       Lorain Childes

## 2012-02-04 NOTE — Progress Notes (Signed)
INITIAL ADULT NUTRITION ASSESSMENT Date: 02/04/2012   Time: 10:36 AM Reason for Assessment: Nutrition Risk for dysphagia  ASSESSMENT: Female 78 y.o.  Dx: Abdominal pain  Hx:  Past Medical History  Diagnosis Date  . Coronary artery disease   . Cancer rt breast  . History of right mastectomy   . Anxiety   . Smoking     Related Meds:  Scheduled Meds:   . acetaminophen      . antiseptic oral rinse  15 mL Mouth Rinse BID  . citalopram  10 mg Oral Daily  . enoxaparin (LOVENOX) injection  30 mg Subcutaneous Q24H  . fluticasone  2 spray Each Nare Daily  . pantoprazole  40 mg Oral Q1200  . piperacillin-tazobactam (ZOSYN)  IV  3.375 g Intravenous Q8H  . pregabalin  75 mg Oral BID  . sodium chloride  3 mL Intravenous Q12H  . tiotropium  18 mcg Inhalation Daily  . tiZANidine  4 mg Oral QHS  . trimethoprim  100 mg Oral Daily   Continuous Infusions:  PRN Meds:.albuterol, albuterol, gadobenate dimeglumine, ipratropium, morphine injection, ondansetron (ZOFRAN) IV, ondansetron, traMADol, triamcinolone cream   Ht: 5\' 5"  (165.1 cm)  Wt: 169 lb 5 oz (76.8 kg)  Ideal Wt: 57 kg  % Ideal Wt: 135% Wt Readings from Last 10 Encounters:  02/04/12 169 lb 5 oz (76.8 kg)  06/10/11 179 lb 3.2 oz (81.285 kg)    Body mass index is 28.18 kg/(m^2). (Over weight)  Food/Nutrition Related Hx: Patient reported she has had a poor appetite and intake. She stated she has been having some trouble chewing due to sore gums. She agreed it would be easier if the food came chopped up already. She agreed to try [employer] nutrition supplement once diet advanced.   Labs:  CMP     Component Value Date/Time   NA 137 02/03/2012 0520   K 3.7 02/03/2012 0520   CL 106 02/03/2012 0520   CO2 24 02/03/2012 0520   GLUCOSE 99 02/03/2012 0520   BUN 16 02/03/2012 0520   CREATININE 0.71 02/03/2012 0520   CALCIUM 7.2* 02/03/2012 0520   PROT 5.8* 02/02/2012 1105   ALBUMIN 3.0* 02/02/2012 1105   AST 28 02/02/2012 1105   ALT 14 02/02/2012 1105   ALKPHOS 59 02/02/2012 1105   BILITOT 0.6 02/02/2012 1105   GFRNONAA 80* 02/03/2012 0520   GFRAA >90 02/03/2012 0520    Intake/Output Summary (Last 24 hours) at 02/04/12 1036 Last data filed at 02/04/12 0316  Gross per 24 hour  Intake    870 ml  Output   1001 ml  Net   -131 ml     Diet Order: NPO  Supplements/Tube Feeding: none at this time.   IVF:    Estimated Nutritional Needs:   Kcal: 1250-1590 Protein: 85-100 grams Fluid: 1 ml per kcal intake  NUTRITION DIAGNOSIS: -Inadequate oral intake (NI-2.1).  Status: Ongoing  RELATED TO: inability to eat  AS EVIDENCE BY: NPO status  MONITORING/EVALUATION(Goals): Diet advancement/ tolerance, weights, labs 1. Diet advancement as medically able to meet > 90% of estimated energy needs.  2. Positive tolerance of diet advancements.   EDUCATION NEEDS: -No education needs identified at this time  INTERVENTION: 1. Recommend Dysphagia 3 diet once diet is advanced.  2. Recommend order pt Resource breeze once daily once diet advanced to provide an additional 250 kcal and 9 grams of protein.  3. RD to follow for nutrition plan of care.   Dietitian [phone]  DOCUMENTATION CODES Per approved criteria  -Not Applicable    Iven Finn [facility] 02/04/2012, 10:36 AM

## 2012-02-04 NOTE — Consult Note (Signed)
Theresa Blackwell 04-30-1931  [id number].   Primary Care MD: Dr. Tyson Dense Requesting MD: Dr. Tyson Dense Chief Complaint/Reason for Consult: abdominal pain HPI: This is a 78 yo female who does not seem to be a good historian, who was apparently admitted for some abdominal pain.  She states it has been going on for "a long time."  When pressured, she finally states around a year.  She has never told her PCP about it, despite its severity.  She describes it as "just pain" and give no further explanation.  She does have some nausea, but does not associate her pain or nausea with eating or anything specific.  She states she does not have reflux, but her friend says she does as she was just complaining of this yesterday.  She has normal bowel movements.  Her pain is located in her abdomen.  She states it's no one place in particular, it just hurts "in my stomach."  Upon admission she has had multiple tests completed.  She does not have any gallstones noted, no evidence of acute cholecystitis, but she does have some extrahepatic biliary ductal dilatation.  She then had an MRCP, which showed this but no other findings to explain it.  Her abdominal u/s was also unremarkable.  She had a HIDA scan done today, but this was also normal.  Her LFTs are unremarkable.  We have been asked to evaluate the patient for possible need for cholecystectomy.  Review of Systems: Please see HPI, otherwise all other systems have been reviewed and are negative.  History reviewed. No pertinent family history.  Past Medical History  Diagnosis Date  . Coronary artery disease   . Cancer rt breast  . Anxiety   . Smoking   . Fibromyalgia   COPD CHF CRF  Past Surgical History  Procedure Date  . Breast surgery   . Abdominal hysterectomy   she states she has had multiple other surgeries, but will not go into further detail about what they were specifically.  Social History:  reports that she has been smoking.  She does  not have any smokeless tobacco history on file. She reports that she does not drink alcohol or use illicit drugs.  Allergies: No Known Allergies  Medications Prior to Admission  Medication Sig Dispense Refill  . acetaminophen-codeine (TYLENOL #3) 300-30 MG per tablet Take 1 tablet by mouth 2 (two) times daily as needed. For pain       . albuterol (PROVENTIL HFA;VENTOLIN HFA) 108 (90 BASE) MCG/ACT inhaler Inhale 2 puffs into the lungs every 4 (four) hours as needed. Shortness of breath       . citalopram (CELEXA) 20 MG tablet Take 10 mg by mouth daily.       . ergocalciferol (VITAMIN D2) 50000 UNITS capsule Take 50,000 Units by mouth every 14 (fourteen) days.       . fluticasone (FLONASE) 50 MCG/ACT nasal spray Place 2 sprays into the nose daily as needed. For allergies       . furosemide (LASIX) 40 MG tablet Take 40 mg by mouth 2 (two) times daily.      Marland Kitchen ipratropium-albuterol (DUONEB) 0.5-2.5 (3) MG/3ML SOLN Take 3 mLs by nebulization 3 (three) times daily as needed. Shortness of breath.      . lisinopril (PRINIVIL,ZESTRIL) 5 MG tablet Take 5 mg by mouth daily.      Marland Kitchen omeprazole (PRILOSEC) 20 MG capsule Take 20 mg by mouth daily.        Marland Kitchen  potassium chloride SA (K-DUR,KLOR-CON) 20 MEQ tablet Take 20 mEq by mouth 2 (two) times daily.      . pregabalin (LYRICA) 75 MG capsule Take 75 mg by mouth 2 (two) times daily.        Marland Kitchen tiotropium (SPIRIVA) 18 MCG inhalation capsule Place 18 mcg into inhaler and inhale daily.        Marland Kitchen tiZANidine (ZANAFLEX) 4 MG capsule Take 4 mg by mouth at bedtime.        . traMADol (ULTRAM) 50 MG tablet Take 100 mg by mouth every 6 (six) hours as needed. Maximum dose= 8 tablets per day For pain      . triamcinolone cream (KENALOG) 0.1 % Apply 1 application topically 2 (two) times daily as needed. Swelling in right arm.      . trimethoprim (TRIMPEX) 100 MG tablet Take 100 mg by mouth daily.        Blood pressure 134/64, pulse 71, temperature 98.4 F (36.9 C), temperature  source Oral, resp. rate 18, height 5\' 5"  (1.651 m), weight 169 lb 5 oz (76.8 kg), SpO2 90.00%. Physical Exam: General: WD, WN white female who is laying in bed in NAD HEENT: head is normocephalic, atraumatic.  Sclera are noninjected.  PERRL.  Ears and nose without any masses or lesions.  Mouth is pink and moist Heart: regular, rate, and rhythm.  Normal s1,s2. No obvious murmurs, gallops, or rubs noted.  Palpable radial and pedal pulses bilaterally Lungs: CTAB, no wheezes, rhonchi, or rales noted.  Respiratory effort nonlabored Abd: soft, minimal tenderness in the lower abdomen and overall greatest on the left side of her abdomen.  Upon my exam patient was not tender in her RUQ at all.  ND, +BS, no masses, hernias, or organomegaly MS: all 4 extremities are symmetrical with no cyanosis, clubbing, or edema. Skin: warm and dry with no masses, lesions, or rashes Psych: A&Ox3 with an appropriate affect.    Results for orders placed during the hospital encounter of 02/01/12 (from the past 48 hour(s))  CARDIAC PANEL(CRET KIN+CKTOT+MB+TROPI)     Status: Abnormal   Collection Time   02/02/12  4:10 PM      Component Value Range Comment   Total CK 524 (*) 7 - 177 U/L    CK, MB 8.5 (*) 0.3 - 4.0 ng/mL CRITICAL VALUE NOTED.  VALUE IS CONSISTENT WITH PREVIOUSLY REPORTED AND CALLED VALUE.   Troponin I 0.32 (*) <0.30 ng/mL    Relative Index 1.6  0.0 - 2.5   CARDIAC PANEL(CRET KIN+CKTOT+MB+TROPI)     Status: Abnormal   Collection Time   02/02/12 11:50 PM      Component Value Range Comment   Total CK 388 (*) 7 - 177 U/L    CK, MB 6.3 (*) 0.3 - 4.0 ng/mL CRITICAL VALUE NOTED.  VALUE IS CONSISTENT WITH PREVIOUSLY REPORTED AND CALLED VALUE.   Troponin I <0.30  <0.30 ng/mL    Relative Index 1.6  0.0 - 2.5   BASIC METABOLIC PANEL     Status: Abnormal   Collection Time   02/03/12  5:20 AM      Component Value Range Comment   Sodium 137  135 - 145 mEq/L    Potassium 3.7  3.5 - 5.1 mEq/L    Chloride 106  96 -  112 mEq/L    CO2 24  19 - 32 mEq/L    Glucose, Bld 99  70 - 99 mg/dL    BUN 16  6 -  23 mg/dL    Creatinine, Ser 7.82  0.50 - 1.10 mg/dL    Calcium 7.2 (*) 8.4 - 10.5 mg/dL    GFR calc non Af Amer 80 (*) >90 mL/min    GFR calc Af Amer >90  >90 mL/min   CBC     Status: Abnormal   Collection Time   02/03/12  5:20 AM      Component Value Range Comment   WBC 5.0  4.0 - 10.5 K/uL    RBC 2.98 (*) 3.87 - 5.11 MIL/uL    Hemoglobin 10.1 (*) 12.0 - 15.0 g/dL    HCT 95.6 (*) 21.3 - 46.0 %    MCV 104.4 (*) 78.0 - 100.0 fL    MCH 33.9  26.0 - 34.0 pg    MCHC 32.5  30.0 - 36.0 g/dL    RDW 08.6  57.8 - 46.9 %    Platelets 157  150 - 400 K/uL   LIPASE, BLOOD     Status: Normal   Collection Time   02/03/12  5:20 AM      Component Value Range Comment   Lipase 40  11 - 59 U/L   MAGNESIUM     Status: Normal   Collection Time   02/03/12  5:20 AM      Component Value Range Comment   Magnesium 1.5  1.5 - 2.5 mg/dL   CBC WITH DIFFERENTIAL     Status: Abnormal   Collection Time   02/04/12  4:32 AM      Component Value Range Comment   WBC 5.0  4.0 - 10.5 K/uL    RBC 2.92 (*) 3.87 - 5.11 MIL/uL    Hemoglobin 10.0 (*) 12.0 - 15.0 g/dL    HCT 62.9 (*) 52.8 - 46.0 %    MCV 105.8 (*) 78.0 - 100.0 fL    MCH 34.2 (*) 26.0 - 34.0 pg    MCHC 32.4  30.0 - 36.0 g/dL    RDW 41.3  24.4 - 01.0 %    Platelets 176  150 - 400 K/uL    Neutrophils Relative 53  43 - 77 %    Neutro Abs 2.7  1.7 - 7.7 K/uL    Lymphocytes Relative 35  12 - 46 %    Lymphs Abs 1.8  0.7 - 4.0 K/uL    Monocytes Relative 11  3 - 12 %    Monocytes Absolute 0.6  0.1 - 1.0 K/uL    Eosinophils Relative 1  0 - 5 %    Eosinophils Absolute 0.0  0.0 - 0.7 K/uL    Basophils Relative 0  0 - 1 %    Basophils Absolute 0.0  0.0 - 0.1 K/uL    Nm Hepatobiliary  02/04/2012  *RADIOLOGY REPORT*  Clinical Data:  Distended gallbladder.  Right upper quadrant pain.  NUCLEAR MEDICINE HEPATOBILIARY IMAGING  Technique:  Sequential images of the abdomen were  obtained out to 60 minutes following intravenous administration of radiopharmaceutical.  Radiopharmaceutical:  4.63mCi Tc-1m Choletec  Comparison:  M r c p 02/03/2012  Findings: There is prompt clearance of radiotracer from the blood pool and homogeneous uptake within the liver.  The gallbladder begins to fill by 20 minutes and continues to fill throughout the study. Excreted bile is evident within the small bowel by 30 minutes.  IMPRESSION:  1.  Patent cystic duct and common bile duct. 2.  No evidence of acute cholecystitis.  Original Report Authenticated By: Genevive Bi, M.D.  Mr 3d Recon At Scanner  02/03/2012  *RADIOLOGY REPORT*  Clinical Data:  Dilated biliary system.  Fever and chills.  MRI ABDOMEN WITHOUT AND WITH CONTRAST (MRCP)  Technique:  Multiplanar multisequence MR imaging of the abdomen was performed without and with contrast, including heavily T2-weighted images of the biliary and pancreatic ducts.  Three-dimensional MR images were rendered by post processing of the original MR data.  Contrast: 15mL MULTIHANCE GADOBENATE DIMEGLUMINE 529 MG/ML IV SOLN,  Comparison:  02/02/2012  Findings:  Mildly elevated left hemidiaphragm observed, with linear subsegmental atelectasis suggested in both lower lobes.  Trace perihepatic ascites noted with a small amount of fluid adjacent to the gallbladder.  Despite efforts by the patient and technologist, motion artifact is present on some series of today's examination and could not be totally eliminated.  This reduces diagnostic sensitivity and specificity.  Pancreas divisum is suspected. 3 mm T2 hyperintense structure anteriorly in the pancreatic body on image 19 of series 12 appears to been present back in 2006 and is considered benign.  The common bile duct measures up to 1.3 cm.  The 3-D reconstructed images suggest conical tapering of the distal CBD in the vicinity of the ampulla, as on image 20 of series 6.  Some of the directly acquired coronal images,  such as image 21 of series 11, suggest more of a distally truncated appearance, but this is probably due to the degree of motion artifact which that is almost all series.  No significant intrahepatic biliary dilatation noted.  No edema or irregularity of the common bile duct is noted.  The gallbladder is flipped up anterior to the right hepatic lobe as on multiple prior exams.  No gallbladder wall thickening is observed.  The spleen appears normal.  Small right adrenal adenoma is stable.  IMPRESSION:  1. Chronic mild extrahepatic biliary dilatation, without beading, irregularity, or filling defect observed.  Sensitivity for subtle findings is reduced by significant motion artifact on most series. 2.  Pancreas divisum.  No specific findings of acute pancreatitis. 3.  Trace perihepatic ascites, etiology uncertain. 4.  Mildly distended gallbladder but without gallbladder wall thickening. 5.  Mild atelectasis in both lung bases. 6.  Slightly elevated left hemidiaphragm.  7.  Stable right adrenal adenoma.  Original Report Authenticated By: Dellia Cloud, M.D.   Mr Abd W/wo Cm/mrcp  02/03/2012  *RADIOLOGY REPORT*  Clinical Data:  Dilated biliary system.  Fever and chills.  MRI ABDOMEN WITHOUT AND WITH CONTRAST (MRCP)  Technique:  Multiplanar multisequence MR imaging of the abdomen was performed without and with contrast, including heavily T2-weighted images of the biliary and pancreatic ducts.  Three-dimensional MR images were rendered by post processing of the original MR data.  Contrast: 15mL MULTIHANCE GADOBENATE DIMEGLUMINE 529 MG/ML IV SOLN,  Comparison:  02/02/2012  Findings:  Mildly elevated left hemidiaphragm observed, with linear subsegmental atelectasis suggested in both lower lobes.  Trace perihepatic ascites noted with a small amount of fluid adjacent to the gallbladder.  Despite efforts by the patient and technologist, motion artifact is present on some series of today's examination and could not be  totally eliminated.  This reduces diagnostic sensitivity and specificity.  Pancreas divisum is suspected. 3 mm T2 hyperintense structure anteriorly in the pancreatic body on image 19 of series 12 appears to been present back in 2006 and is considered benign.  The common bile duct measures up to 1.3 cm.  The 3-D reconstructed images suggest conical tapering of the distal CBD in the  vicinity of the ampulla, as on image 20 of series 6.  Some of the directly acquired coronal images, such as image 21 of series 11, suggest more of a distally truncated appearance, but this is probably due to the degree of motion artifact which that is almost all series.  No significant intrahepatic biliary dilatation noted.  No edema or irregularity of the common bile duct is noted.  The gallbladder is flipped up anterior to the right hepatic lobe as on multiple prior exams.  No gallbladder wall thickening is observed.  The spleen appears normal.  Small right adrenal adenoma is stable.  IMPRESSION:  1. Chronic mild extrahepatic biliary dilatation, without beading, irregularity, or filling defect observed.  Sensitivity for subtle findings is reduced by significant motion artifact on most series. 2.  Pancreas divisum.  No specific findings of acute pancreatitis. 3.  Trace perihepatic ascites, etiology uncertain. 4.  Mildly distended gallbladder but without gallbladder wall thickening. 5.  Mild atelectasis in both lung bases. 6.  Slightly elevated left hemidiaphragm.  7.  Stable right adrenal adenoma.  Original Report Authenticated By: Dellia Cloud, M.D.       Assessment/Plan 1. Abdominal pain, unknown etiology 2. Extrahepatic ductal dilatation, etiology unknown 3. CRF 4. COPD 5. CAD 6. CHF 7. H/o right breast cancer  Plan: 1. The patient's history does not seem c/w biliary disease; however, her story is difficult to follow as she does not elaborate or give a good history.  When I examined the patient, she was not  tender in her RUQ c/w biliary disease.  She does have extrahepatic ductal dilatation for which her etiology is unclear.  GI has evaluated the patient and has recommended and EUS.  Given this is less invasive than surgery, and her gallbladder is not the definite cause, it may be best to proceed with an EUS and if no other cause for her pain is found, then we can proceed with possible cholecystectomy.  Will d/w Dr. Jamey Ripa.  Thank you for this consultation.  Ellie Bryand E 02/04/2012, 2:17 PM

## 2012-02-05 MED ORDER — FENTANYL 12 MCG/HR TD PT72: 1.0000 | MEDICATED_PATCH | TRANSDERMAL | Status: AC

## 2012-02-05 MED ORDER — FENTANYL 12 MCG/HR TD PT72
12.5000 ug | MEDICATED_PATCH | TRANSDERMAL | Status: DC
  Administered 2012-02-05: 12.5 ug via TRANSDERMAL
  Filled 2012-02-05: qty 1

## 2012-02-05 MED ORDER — FENTANYL 12 MCG/HR TD PT72: 1.0000 | MEDICATED_PATCH | TRANSDERMAL | Status: DC

## 2012-02-05 MED ORDER — DICYCLOMINE HCL 20 MG PO TABS: 20.0000 mg | ORAL_TABLET | Freq: Three times a day (TID) | ORAL | Status: DC | PRN

## 2012-02-05 MED ORDER — POTASSIUM CHLORIDE ER 10 MEQ PO TBCR: 10.0000 meq | EXTENDED_RELEASE_TABLET | Freq: Every day | ORAL | Status: DC

## 2012-02-05 MED ORDER — DICYCLOMINE HCL 20 MG PO TABS
20.0000 mg | ORAL_TABLET | Freq: Three times a day (TID) | ORAL | Status: DC | PRN
  Filled 2012-02-05: qty 1

## 2012-02-05 NOTE — Discharge Summary (Signed)
Physician Discharge Summary  Patient ID: Theresa Blackwell MRN: [id number] DOB/AGE: 26-Jul-1930 78 y.o.  Admit date: 02/01/2012 Discharge date: 02/05/2012  Admission Diagnoses:  Discharge Diagnoses:  Principal Problem:  *Abdominal pain- etiology unclear workup was negative including CT scan of the abdomen, MRCP, ultrasound of the upper abdomen, HIDA scan. Blood work normal Active Problems:  Hypokalemia Chronic pain syndrome Fibromyalgia IBS dehydration  COPD (chronic obstructive pulmonary disease) with acute bronchitis  CHF (congestive heart failure) Chronic cystitis  Acute on chronic renal failure  Nonspecific (abnormal) findings on radiological and other examination of biliary tract Depression History of breast cancer with right arm lymphedema chronic Chronic back and shoulder pain   Discharged Condition: good  Hospital Course: 78 years old female with chronic pain syndrome, intermittent abdominal pain admitted with abdominal pain dehydration, UTI with chronic history of cystitis. Problem #1: abdominal pain, patient had extensive workup during this hospitalization. Including initially up a program ultrasound which showed mildly distended gallbladder and mildly distended common bile duct, her blood work including lipase amylase blood counts and LFTs were normal. Patient had MRCP which showed no obstruction. GI consulted: recommended outpatient workup with EUS. HIDA scan was obtained which showed normal filling without any defect. CEA normal CA 19/9 was 5.1;  8 months ago was 5.2. CT scan of the abdomen pelvic unremarkable Ultrasound of the upper abdomen no acute finding Blood cultures remained negative Lactic acid 0.8. Magnesium 1.5 Normal white count Hemoglobin 10.0 TSH 0.4 Amylase 19 Lipase 41  Surgery was consulted; Did nothing was any gallbladder disease and no indication for surgery at this point. Patient continues to have intermittent abdominal pain during the  hospitalization treated with morphine. She had been on Tylenol with Codeine at home as well as tramadol, history of chronic pain with fibromyalgia. Description of the pain diffuse; she had good bowel movements without any blood. Continued on PPI. Started on dicyclomine for IBS. Followup outpatient. Problem #2: history of CHF patient was on higher dose of Lasix at the time of admission her creatinine was elevated to 1.4 and also BUN was elevated, her Lasix was held she was started on IV fluids BUN and creatinine improved at the time of discharge her creatinine was 0.7, BUN16 , potassium 3.7, we'll be restarting her Lasix low dose at 40 mg once a day and potassium 10 mEq with followup outpatient.chest x-ray negative Problem #3: mildly elevated troponin, no acute cardiac event; nonspecific asymptomatic  nonsustain  20 beat V tach, while asleep. Problem #4: chronic pain syndrome, fibromyalgia; started on Duragesic patch 12 mcg; continue tramadol p.r.n. For pain Followup outpatient Problem #5: COPD continue on nebulizer treatments, no active issues Problem #6 depression continue on Celexa Problem #7: UTI urine was positive cultures remained negative she was started on Zosyn IV, chronic cystitis continue on trimethoprim White count remained normal; no fever Problem #8: hypokalemia resolved Problem #9: deconditioning and weakness with gait abnormality physical therapy consulted home health with therapy. Problem #10: history of breast cancer right mastectomy with right arm lymphedema chronic    Consults: GI and general surgery  Significant Diagnostic Studies: labs:labs above, microbiology: blood culture: negative and urine culture: negative and radiology: CXR: normal, MRI: MRCP negative CT scan: CT scan of the abdomen pelvic no acute finding; and Ultrasound: mildly distended gallbladder; HIDA scan negative  Treatments: IV hydration, antibiotics: Zosyn and analgesia: Morphine  Discharge Exam: Blood  pressure 139/67, pulse 52, temperature 98.6 F (37 C), temperature source Oral, resp. rate 18, height 5'  5" (1.651 m), weight 76 kg (167 lb 8.8 oz), SpO2 90.00%. General appearance: alert Resp: clear to auscultation bilaterally Cardio: regular rate and rhythm GI: soft positive bowel sound mildly tender diffusely without rebound or guarding  Disposition: 06-Home-Health Care Svc  Discharge Orders    Future Orders Please Complete By Expires   Diet - low sodium heart healthy      Increase activity slowly        Medication List  As of 02/05/2012  8:17 AM   STOP taking these medications         potassium chloride SA 20 MEQ tablet         TAKE these medications         acetaminophen-codeine 300-30 MG per tablet   Commonly known as: TYLENOL #3   Take 1 tablet by mouth 2 (two) times daily as needed. For pain      albuterol 108 (90 BASE) MCG/ACT inhaler   Commonly known as: PROVENTIL HFA;VENTOLIN HFA   Inhale 2 puffs into the lungs every 4 (four) hours as needed. Shortness of breath      citalopram 20 MG tablet   Commonly known as: CELEXA   Take 10 mg by mouth daily.      dicyclomine 20 MG tablet   Commonly known as: BENTYL   Take 1 tablet (20 mg total) by mouth 3 (three) times daily between meals as needed (stomach pain).      ergocalciferol 50000 UNITS capsule   Commonly known as: VITAMIN D2   Take 50,000 Units by mouth every 14 (fourteen) days.      fentaNYL 12 MCG/HR   Commonly known as: DURAGESIC - dosed mcg/hr   Place 1 patch (12.5 mcg total) onto the skin every 3 (three) days.      fentaNYL 12 MCG/HR   Commonly known as: DURAGESIC - dosed mcg/hr   Place 1 patch (12.5 mcg total) onto the skin every 3 (three) days.      fluticasone 50 MCG/ACT nasal spray   Commonly known as: FLONASE   Place 2 sprays into the nose daily as needed. For allergies      furosemide 40 MG tablet   Commonly known as: LASIX   Take 1 tablet (40 mg total) by mouth daily.       ipratropium-albuterol 0.5-2.5 (3) MG/3ML Soln   Commonly known as: DUONEB   Take 3 mLs by nebulization 3 (three) times daily as needed. Shortness of breath.      lisinopril 5 MG tablet   Commonly known as: PRINIVIL,ZESTRIL   Take 5 mg by mouth daily.      omeprazole 20 MG capsule   Commonly known as: PRILOSEC   Take 20 mg by mouth daily.      potassium chloride 10 MEQ tablet   Commonly known as: K-DUR   Take 1 tablet (10 mEq total) by mouth daily.      pregabalin 75 MG capsule   Commonly known as: LYRICA   Take 75 mg by mouth 2 (two) times daily.      tiotropium 18 MCG inhalation capsule   Commonly known as: SPIRIVA   Place 18 mcg into inhaler and inhale daily.      tiZANidine 4 MG capsule   Commonly known as: ZANAFLEX   Take 4 mg by mouth at bedtime.      traMADol 50 MG tablet   Commonly known as: ULTRAM   Take 100 mg by mouth every 6 (six) hours  as needed. Maximum dose= 8 tablets per day  For pain      triamcinolone cream 0.1 %   Commonly known as: KENALOG   Apply 1 application topically 2 (two) times daily as needed. Swelling in right arm.      trimethoprim 100 MG tablet   Commonly known as: TRIMPEX   Take 100 mg by mouth daily.           Follow-up Information    Follow up with Lina Sar, MD. (for EUS )    Contact information:   [address] [facility] [address] [place] [address] [place] [address] [phone]       Follow up with Georgann Housekeeper, MD.   Contact information:   [address] [place] [address] [phone]        discharge time planning and discussion with the family, total time 45 minutes.  SignedGeorgann Housekeeper 02/05/2012, 8:17 AM

## 2012-02-05 NOTE — Progress Notes (Addendum)
Patient had run of 20 beats VT. Asymptomatic. Dr Debby Bud paged and notified of current VT and that patient had some VT this past weekend which was due to low Magnesium level (per prior RN report)- no labs ordered this am. Per Dr Debby Bud stated "this is the kind of thing that can wait for the attending doctor to make rounds. Dr Donette Larry should be there in about 2 hours". Will continue to monitor patient. Strips placed on shadow chart in [place]. Ginny Forth

## 2012-02-05 NOTE — Evaluation (Addendum)
Physical Therapy Evaluation Patient Details Name: Theresa Blackwell MRN: [id number] DOB: 11-02-1930 Today's Date: 02/05/2012 Time: 4098-1191 PT Time Calculation (min): 9 min  PT Assessment / Plan / Recommendation Clinical Impression  Pt admitted for abdominal pain.  Pt reports not being very mobile at home however does short distance ambulation and transfers to [facility] with supervision to some assist depending on weakness.  Family in room report pt occasionally requires assist for transfers.  Pt would benefit from acute PT services if pt remains in hospital in order to increase overall strength to improve transfers and ambulation to prepare for d/c home with family.  Family member reports he sometimes needs to help pt out of regular bed and would be interested in hospital bed  and HHPT depending on insurance.    PT Assessment  Patient needs continued PT services    Follow Up Recommendations  Home health PT;Supervision for mobility/OOB    Barriers to Discharge        Equipment Recommendations  Wheelchair (measurements);Hospital bed    Recommendations for Other Services     Frequency Min 3X/week    Precautions / Restrictions Precautions Precautions: Fall   Pertinent Vitals/Pain 10/10 abdominal pain, family reports pt premedicated       Mobility  Bed Mobility Bed Mobility: Supine to Sit;Sit to Supine Supine to Sit: 6: Modified independent (Device/Increase time) Sit to Supine: 6: Modified independent (Device/Increase time) Transfers Transfers: Sit to Stand;Stand to Sit;Stand Pivot Transfers Sit to Stand: 4: Min guard Stand to Sit: 4: Min guard Stand Pivot Transfers: 4: Min guard Details for Transfer Assistance: pt demonstrated her usual transfer to [facility] at home, pt pulled BSC directly in front of her to stand up then turned around and sat down Ambulation/Gait Ambulation/Gait Assistance: Not tested (comment)    Exercises     PT Diagnosis: Generalized weakness  PT Problem List:  Decreased strength;Decreased activity tolerance;Decreased mobility;Decreased knowledge of use of DME PT Treatment Interventions: DME instruction;Gait training;Functional mobility training;Therapeutic exercise;Therapeutic activities;Patient/family education;Wheelchair mobility training   PT Goals Acute Rehab PT Goals PT Goal Formulation: With patient Time For Goal Achievement: 02/12/12 Potential to Achieve Goals: Good Pt will go Sit to Stand: with supervision PT Goal: Sit to Stand - Progress: Goal set today Pt will go Stand to Sit: with supervision PT Goal: Stand to Sit - Progress: Goal set today Pt will Transfer Bed to Chair/Chair to Bed: with supervision PT Transfer Goal: Bed to Chair/Chair to Bed - Progress: Goal set today Pt will Ambulate: 1 - 15 feet;with supervision;with least restrictive assistive device PT Goal: Ambulate - Progress: Goal set today  Visit Information  Last PT Received On: 02/05/12 Assistance Needed: +1    Subjective Data  Subjective: I don't walk at home.   Prior Functioning  Home Living Lives With: Family Available Help at Discharge: Family Type of Home: House Home Adaptive Equipment: Bedside commode/3-in-1;Walker - rolling;Straight cane Prior Function Level of Independence: Needs assistance Needs Assistance: Transfers;Gait Gait Assistance: pt reports very short distance household ambulation with cane or RW Transfer Assistance: family member reports variable assist for transfers to [facility] depending on weakness Communication Communication: No difficulties    Cognition  Overall Cognitive Status: Appears within functional limits for tasks assessed/performed    Extremity/Trunk Assessment Right Upper Extremity Assessment RUE ROM/Strength/Tone: [facility] for tasks assessed Left Upper Extremity Assessment LUE ROM/Strength/Tone: [facility] for tasks assessed Right Lower Extremity Assessment RLE ROM/Strength/Tone: [facility] for tasks assessed Left Lower Extremity  Assessment LLE ROM/Strength/Tone: [facility] for tasks  assessed   Balance    End of Session PT - End of Session Activity Tolerance: Patient limited by pain Patient left: in bed;with call bell/phone within reach;with family/visitor present  GP     Theresa Blackwell,KATHrine E 02/05/2012, 10:22 AM Pager: [phone]

## 2012-02-05 NOTE — Progress Notes (Signed)
[facility] SUSAN DALE INFORMED OF D/C HOME TODAY.HHPT/NURSE'S AIDE.

## 2012-02-05 NOTE — Telephone Encounter (Signed)
Pt has been scheduled for [facility] [employer] notified and she will give to the pt

## 2012-02-05 NOTE — Progress Notes (Signed)
Patient discharged home with son, discharge instructions given and explained to patient/son and they verbalized understanding. F/U outpatient with GI doctor. Skin intact. No wound. Patient accompanied home by son, transported to the car via wheelchair.

## 2012-02-05 NOTE — Plan of Care (Signed)
Problem: Discharge Progression Outcomes Goal: Activity appropriate for discharge plan Outcome: Adequate for Discharge Home health PT/OT

## 2012-02-08 LAB — CULTURE, BLOOD (ROUTINE X 2): Culture: NO GROWTH

## 2012-03-04 ENCOUNTER — Encounter: Payer: Self-pay | Admitting: Gastroenterology

## 2012-03-04 ENCOUNTER — Ambulatory Visit (INDEPENDENT_AMBULATORY_CARE_PROVIDER_SITE_OTHER): Payer: Medicare Other | Admitting: Gastroenterology

## 2012-03-04 VITALS — BP 112/60 | HR 60 | Ht 65.0 in | Wt 153.0 lb

## 2012-03-04 DIAGNOSIS — R110 Nausea: Secondary | ICD-10-CM

## 2012-03-04 DIAGNOSIS — R109 Unspecified abdominal pain: Secondary | ICD-10-CM

## 2012-03-04 DIAGNOSIS — K838 Other specified diseases of biliary tract: Secondary | ICD-10-CM

## 2012-03-04 DIAGNOSIS — G8929 Other chronic pain: Secondary | ICD-10-CM

## 2012-03-04 NOTE — Patient Instructions (Addendum)
One of your biggest health concerns is your smoking.  This increases your risk for most cancers and serious cardiovascular diseases such as strokes, heart attacks.  You should try your best to stop.  If you need assistance, please contact your PCP or Smoking Cessation Class at [facility] [phone]) or [facility] Quit-Line (1-800-QUIT-NOW). We will get records from your previous gastroentologist Dr. Danise Edge (colonoscopy 12/12) and your primary care Dr. Rene Paci. You will be set up for an upper endoscopic ultrasound (60 min, radial +/- linear examination, [facility], ++propofol for dilated bile duct) Narcotic pain medicines such as duragesic patch, Tylenol #3, celexa, tramadol ALL cause or at least contribute to your nausea.  These are more likely your problem than they dilated bile duct.  You need to try to cut back on these as best that you can. A copy of this information will be made available to Dr. Donette Larry.

## 2012-03-04 NOTE — Progress Notes (Signed)
HPI: This is a   78 year old woman whom I am meeting for the first time today.  I was called last month to meet her since her bile that was swollen and she was reported to have never met a gastroenterologist before.  Dr. Juanda Chance saw her when she was in the hospital actually, she also had previously undergone a colonoscopy at the gastroenterology with Dr. Laural Benes. This colonoscopy, from what the family can remember, was done for her chronic abdominal pain and nausea. I do not have that result available.  While she was hospitalized last month she underwent a CT scan of her abdomen and pelvis, an MRI with MRCP images, an ultrasound, an acute HIDA scan, she had multiple blood tests done including normal LFTs, normal CEA level, normal CA 19 9 level.  Has lower abdominal pain has been going on for 2-3 years.  The pain is there all of the time, can wax and wane.  Bad night last night.  Nausea about all the time.   Has a BM most days.    She is telling me she has significant abdominal pain and nausea for at least 2-3 years. She is not a very good historian. Her son and husband are with her. She is a bit tremulous today in the office. She tells me that she saw her primary care physician last week and her husband was under the impression that today's visit was to perform some nuclear medicine scan of her gallbladder such as a HIDA scan.  She had a colonoscopy with Dr. Laural Benes 12/12 (I don't have those records). They think that the colonoscopy was done because of the pains.  [facility]. They aren't sure of what Dr. Laural Benes found or whether he could explain the pains.    Her husband thinks she has a UTI now.    Saw Dr. Juanda Chance in hospital 5-6 weeks ago.  shakey  Review of systems: Pertinent positive and negative review of systems were noted in the above HPI section. Complete review of systems was performed and was otherwise normal.    Past Medical History  Diagnosis Date  . Coronary artery  disease   . Breast CA rt breast  . Anxiety   . Smoking   . Fibromyalgia   . COPD (chronic obstructive pulmonary disease)     Past Surgical History  Procedure Date  . Breast surgery   . Abdominal hysterectomy   . Appendectomy     Current Outpatient Prescriptions  Medication Sig Dispense Refill  . acetaminophen-codeine (TYLENOL #3) 300-30 MG per tablet Take 1 tablet by mouth 2 (two) times daily as needed. For pain       . albuterol (PROVENTIL HFA;VENTOLIN HFA) 108 (90 BASE) MCG/ACT inhaler Inhale 2 puffs into the lungs every 4 (four) hours as needed. Shortness of breath       . citalopram (CELEXA) 20 MG tablet Take 10 mg by mouth daily.       Marland Kitchen dicyclomine (BENTYL) 20 MG tablet Take 1 tablet (20 mg total) by mouth 3 (three) times daily between meals as needed (stomach pain).  30 tablet  3  . ergocalciferol (VITAMIN D2) 50000 UNITS capsule Take 50,000 Units by mouth every 14 (fourteen) days.       . fentaNYL (DURAGESIC - DOSED MCG/HR) 12 MCG/HR Place 1 patch (12.5 mcg total) onto the skin every 3 (three) days.  10 patch  0  . fluticasone (FLONASE) 50 MCG/ACT nasal spray Place 2 sprays into the nose daily  as needed. For allergies       . furosemide (LASIX) 40 MG tablet Take 1 tablet (40 mg total) by mouth daily.  30 tablet  0  . ipratropium-albuterol (DUONEB) 0.5-2.5 (3) MG/3ML SOLN Take 3 mLs by nebulization 3 (three) times daily as needed. Shortness of breath.      . lisinopril (PRINIVIL,ZESTRIL) 5 MG tablet Take 5 mg by mouth daily.      Marland Kitchen omeprazole (PRILOSEC) 20 MG capsule Take 20 mg by mouth daily.        . potassium chloride (K-DUR) 10 MEQ tablet Take 1 tablet (10 mEq total) by mouth daily.  30 tablet  6  . pregabalin (LYRICA) 75 MG capsule Take 75 mg by mouth 2 (two) times daily.        Marland Kitchen tiotropium (SPIRIVA) 18 MCG inhalation capsule Place 18 mcg into inhaler and inhale daily.        Marland Kitchen tiZANidine (ZANAFLEX) 4 MG capsule Take 4 mg by mouth at bedtime.        . traMADol (ULTRAM) 50  MG tablet Take 100 mg by mouth every 6 (six) hours as needed. Maximum dose= 8 tablets per day For pain      . triamcinolone cream (KENALOG) 0.1 % Apply 1 application topically 2 (two) times daily as needed. Swelling in right arm.      . trimethoprim (TRIMPEX) 100 MG tablet Take 100 mg by mouth daily.        Allergies as of 03/04/2012  . (No Known Allergies)    Family History  Problem Relation Age of Onset  . Colon cancer Brother   . Heart disease Father     History   Social History  . Marital Status: Widowed    Spouse Name: N/A    Number of Children: 2  . Years of Education: N/A   Occupational History  . Retired    Social History Main Topics  . Smoking status: Current Everyday Smoker -- 2.0 packs/day  . Smokeless tobacco: Never Used  . Alcohol Use: No  . Drug Use: No  . Sexually Active: Not on file   Other Topics Concern  . Not on file   Social History Narrative  . No narrative on file       Physical Exam: BP 112/60  Pulse 60  Ht 5\' 5"  (1.651 m)  Wt 153 lb (69.4 kg)  BMI 25.46 kg/m2 Constitutional:  Chronically debilitated, sits in a wheelchair Psychiatric: alert and oriented x3 Eyes: extraocular movements intact Mouth: oral pharynx moist, no lesions Neck: supple no lymphadenopathy Cardiovascular: heart regular rate and rhythm Lungs: clear to auscultation bilaterally Abdomen: soft, nontender, nondistended, no obvious ascites, no peritoneal signs, normal bowel sounds Extremities: no lower extremity edema bilaterally Skin: no lesions on visible extremities    Assessment and plan: 78 y.o. female with  chronic abdominal pain, chronic nausea  80 get her records from her previous gastroenterologist Dr. Laural Benes at [facility] gastroenterology. He performed a colonoscopy for her less than a year ago, family believes it was for chronic abdominal pain. It is not clear what he found or what he recommended.  She had multiple imaging and blood tests last month while  hospitalized. The only finding of note was a dilated bile duct. This was on MRI as well as ultrasound.  It is not clear to me if that is a pathologic finding for her or whether it is contributing to her chronic nausea and abdominal pain. Importantly, her liver tests  were normal during that admission leading me to believe that her bile but is not responsible for her pains. Further evaluation with endoscopic ultrasound is reasonable I will schedule that at her soonest convenience. That should be done with propofol sedation. I think a more likely explanation for her nausea and possibly even her abdominal pains it is a fact that she is on 2 forms of narcotic pain medicines, both of which can cause nausea obviously. She takes Celexa, but #1 side effect of this is nausea. She takes tramadol, this is also known to cause nausea. I recommended she try cutting back on all of these various medicines as best that she can. We will try to get records from her previous gastroenterologist.

## 2012-03-05 ENCOUNTER — Emergency Department (HOSPITAL_COMMUNITY): Payer: Medicare Other

## 2012-03-05 ENCOUNTER — Encounter (HOSPITAL_COMMUNITY): Payer: Self-pay

## 2012-03-05 ENCOUNTER — Inpatient Hospital Stay (HOSPITAL_COMMUNITY)
Admission: EM | Admit: 2012-03-05 | Discharge: 2012-03-10 | DRG: 190 | Disposition: A | Discharge status: Skilled Nursing Facility | Payer: Medicare Other | Attending: Internal Medicine | Admitting: Internal Medicine

## 2012-03-05 DIAGNOSIS — J9610 Chronic respiratory failure, unspecified whether with hypoxia or hypercapnia: Secondary | ICD-10-CM

## 2012-03-05 DIAGNOSIS — R0602 Shortness of breath: Secondary | ICD-10-CM | POA: Diagnosis present

## 2012-03-05 DIAGNOSIS — G9349 Other encephalopathy: Secondary | ICD-10-CM | POA: Diagnosis present

## 2012-03-05 DIAGNOSIS — I471 Supraventricular tachycardia: Secondary | ICD-10-CM

## 2012-03-05 DIAGNOSIS — I4891 Unspecified atrial fibrillation: Secondary | ICD-10-CM

## 2012-03-05 DIAGNOSIS — R109 Unspecified abdominal pain: Secondary | ICD-10-CM | POA: Diagnosis present

## 2012-03-05 DIAGNOSIS — R5381 Other malaise: Secondary | ICD-10-CM | POA: Diagnosis present

## 2012-03-05 DIAGNOSIS — M797 Fibromyalgia: Secondary | ICD-10-CM

## 2012-03-05 DIAGNOSIS — I359 Nonrheumatic aortic valve disorder, unspecified: Secondary | ICD-10-CM

## 2012-03-05 DIAGNOSIS — I10 Essential (primary) hypertension: Secondary | ICD-10-CM | POA: Diagnosis present

## 2012-03-05 DIAGNOSIS — Z9010 Acquired absence of unspecified breast and nipple: Secondary | ICD-10-CM

## 2012-03-05 DIAGNOSIS — R932 Abnormal findings on diagnostic imaging of liver and biliary tract: Secondary | ICD-10-CM

## 2012-03-05 DIAGNOSIS — I5032 Chronic diastolic (congestive) heart failure: Secondary | ICD-10-CM | POA: Diagnosis present

## 2012-03-05 DIAGNOSIS — N3090 Cystitis, unspecified without hematuria: Secondary | ICD-10-CM | POA: Diagnosis present

## 2012-03-05 DIAGNOSIS — G894 Chronic pain syndrome: Secondary | ICD-10-CM | POA: Diagnosis present

## 2012-03-05 DIAGNOSIS — Z720 Tobacco use: Secondary | ICD-10-CM

## 2012-03-05 DIAGNOSIS — I498 Other specified cardiac arrhythmias: Secondary | ICD-10-CM | POA: Diagnosis present

## 2012-03-05 DIAGNOSIS — Z8249 Family history of ischemic heart disease and other diseases of the circulatory system: Secondary | ICD-10-CM

## 2012-03-05 DIAGNOSIS — K219 Gastro-esophageal reflux disease without esophagitis: Secondary | ICD-10-CM | POA: Diagnosis present

## 2012-03-05 DIAGNOSIS — J209 Acute bronchitis, unspecified: Principal | ICD-10-CM | POA: Diagnosis present

## 2012-03-05 DIAGNOSIS — E876 Hypokalemia: Secondary | ICD-10-CM | POA: Diagnosis present

## 2012-03-05 DIAGNOSIS — R4182 Altered mental status, unspecified: Secondary | ICD-10-CM

## 2012-03-05 DIAGNOSIS — F419 Anxiety disorder, unspecified: Secondary | ICD-10-CM

## 2012-03-05 DIAGNOSIS — I509 Heart failure, unspecified: Secondary | ICD-10-CM | POA: Diagnosis present

## 2012-03-05 DIAGNOSIS — Z23 Encounter for immunization: Secondary | ICD-10-CM

## 2012-03-05 DIAGNOSIS — Z79899 Other long term (current) drug therapy: Secondary | ICD-10-CM

## 2012-03-05 DIAGNOSIS — N390 Urinary tract infection, site not specified: Secondary | ICD-10-CM

## 2012-03-05 DIAGNOSIS — Z853 Personal history of malignant neoplasm of breast: Secondary | ICD-10-CM

## 2012-03-05 DIAGNOSIS — M19019 Primary osteoarthritis, unspecified shoulder: Secondary | ICD-10-CM | POA: Diagnosis present

## 2012-03-05 DIAGNOSIS — I4719 Other supraventricular tachycardia: Secondary | ICD-10-CM | POA: Diagnosis present

## 2012-03-05 DIAGNOSIS — J440 Chronic obstructive pulmonary disease with acute lower respiratory infection: Principal | ICD-10-CM | POA: Diagnosis present

## 2012-03-05 DIAGNOSIS — F17200 Nicotine dependence, unspecified, uncomplicated: Secondary | ICD-10-CM | POA: Diagnosis present

## 2012-03-05 DIAGNOSIS — E785 Hyperlipidemia, unspecified: Secondary | ICD-10-CM | POA: Diagnosis present

## 2012-03-05 DIAGNOSIS — F411 Generalized anxiety disorder: Secondary | ICD-10-CM | POA: Diagnosis present

## 2012-03-05 DIAGNOSIS — R627 Adult failure to thrive: Secondary | ICD-10-CM | POA: Diagnosis present

## 2012-03-05 HISTORY — DX: Unspecified osteoarthritis, unspecified site: M19.90

## 2012-03-05 HISTORY — DX: Hyperlipidemia, unspecified: E78.5

## 2012-03-05 HISTORY — DX: Benign neoplasm of unspecified adrenal gland: D35.00

## 2012-03-05 HISTORY — DX: Depression, unspecified: F32.A

## 2012-03-05 HISTORY — DX: Chronic respiratory failure, unspecified whether with hypoxia or hypercapnia: J96.10

## 2012-03-05 HISTORY — DX: Major depressive disorder, single episode, unspecified: F32.9

## 2012-03-05 HISTORY — DX: Hypercalcemia: E83.52

## 2012-03-05 LAB — COMPREHENSIVE METABOLIC PANEL
ALT: 7 U/L (ref 0–35)
Alkaline Phosphatase: 71 U/L (ref 39–117)
BUN: 16 mg/dL (ref 6–23)
CO2: 25 mEq/L (ref 19–32)
Calcium: 9.1 mg/dL (ref 8.4–10.5)
Calcium: 8.9 mg/dL (ref 8.4–10.5)
GFR calc Af Amer: 71 mL/min — ABNORMAL LOW (ref >90)
GFR calc Af Amer: 68 mL/min — ABNORMAL LOW (ref >90)
GFR calc non Af Amer: 58 mL/min — ABNORMAL LOW (ref >90)
Glucose, Bld: 99 mg/dL (ref 70–99)
Glucose, Bld: 112 mg/dL — ABNORMAL HIGH (ref 70–99)
Sodium: 137 mEq/L (ref 135–145)
Total Bilirubin: 0.5 mg/dL (ref 0.3–1.2)
Total Protein: 6.9 g/dL (ref 6.0–8.3)

## 2012-03-05 LAB — URINALYSIS, ROUTINE W REFLEX MICROSCOPIC
Bilirubin Urine: NEGATIVE
Ketones, ur: 15 mg/dL — AB
Specific Gravity, Urine: 1.01 (ref 1.005–1.030)
Urobilinogen, UA: 0.2 mg/dL (ref 0.0–1.0)

## 2012-03-05 LAB — LACTIC ACID, PLASMA: Lactic Acid, Venous: 1.4 mmol/L (ref 0.5–2.2)

## 2012-03-05 LAB — TROPONIN I: Troponin I: <0.3 ng/mL (ref <0.30)

## 2012-03-05 LAB — CBC WITH DIFFERENTIAL/PLATELET
Eosinophils Absolute: 0 10*3/uL (ref 0.0–0.7)
Eosinophils Relative: 0 % (ref 0–5)
Eosinophils Relative: 0 % (ref 0–5)
HCT: 36.9 % (ref 36.0–46.0)
Hemoglobin: 12.4 g/dL (ref 12.0–15.0)
Lymphocytes Relative: 11 % — ABNORMAL LOW (ref 12–46)
Lymphs Abs: 0.9 10*3/uL (ref 0.7–4.0)
Lymphs Abs: 1.1 10*3/uL (ref 0.7–4.0)
MCH: 33.2 pg (ref 26.0–34.0)
MCV: 100.8 fL — ABNORMAL HIGH (ref 78.0–100.0)
MCV: 101.7 fL — ABNORMAL HIGH (ref 78.0–100.0)
Monocytes Relative: 5 % (ref 3–12)
Platelets: 193 10*3/uL (ref 150–400)
Platelets: 203 10*3/uL (ref 150–400)
RBC: 3.74 MIL/uL — ABNORMAL LOW (ref 3.87–5.11)
RBC: 3.63 MIL/uL — ABNORMAL LOW (ref 3.87–5.11)
WBC: 10 10*3/uL (ref 4.0–10.5)

## 2012-03-05 LAB — POCT I-STAT TROPONIN I

## 2012-03-05 MED ORDER — DILTIAZEM HCL 100 MG IV SOLR
5.0000 mg/h | INTRAVENOUS | Status: DC
  Administered 2012-03-05 – 2012-03-06 (×3): 10 mg/h via INTRAVENOUS
  Filled 2012-03-05 (×2): qty 100

## 2012-03-05 MED ORDER — ONDANSETRON HCL 4 MG/2ML IJ SOLN
INTRAMUSCULAR | Status: AC
  Administered 2012-03-05: 4 mg via INTRAVENOUS
  Filled 2012-03-05: qty 2

## 2012-03-05 MED ORDER — MAGNESIUM SULFATE 40 MG/ML IJ SOLN
2.0000 g | Freq: Once | INTRAMUSCULAR | Status: AC
  Administered 2012-03-05: 2 g via INTRAVENOUS
  Filled 2012-03-05: qty 50

## 2012-03-05 MED ORDER — FENTANYL CITRATE 0.05 MG/ML IJ SOLN
50.0000 ug | Freq: Once | INTRAMUSCULAR | Status: AC
  Administered 2012-03-05: 50 ug via INTRAVENOUS
  Filled 2012-03-05: qty 2

## 2012-03-05 MED ORDER — ONDANSETRON HCL 4 MG/2ML IJ SOLN
4.0000 mg | Freq: Once | INTRAMUSCULAR | Status: AC
  Administered 2012-03-05 (×2): 4 mg via INTRAVENOUS

## 2012-03-05 MED ORDER — DEXTROSE 5 % IV SOLN
1.0000 g | Freq: Once | INTRAVENOUS | Status: AC
  Administered 2012-03-05: 1 g via INTRAVENOUS
  Filled 2012-03-05: qty 10

## 2012-03-05 NOTE — Progress Notes (Signed)
  Echocardiogram 2D Echocardiogram has been performed.  Theresa Blackwell [name] 03/05/2012, 5:33 PM

## 2012-03-05 NOTE — ED Provider Notes (Signed)
I saw this patient in conjunction with PA, patient arrived with an irregular heartbeat, at first appeared to be atrial fibrillation however later on the monitor looked like it was multifocal atrial tachycardia. Patient on first presentation was diaphoretic grasping her belly, and vomiting. She looked ill, she was given a Cardizem bolus of 10 mg and placed on a drip - in addition she was given some Zofran, these interventions worked well and she started to look much better.   In addition patient's first EKG showed some T-wave inversions in the precordial leads V2 through V4 which were concerning for a Wellens syndrome, cardiology was consulted, and evaluated by PA at bedside as well as Dr. Meredith Mody. He felt that the primary cause for the patient's presentation was likely not cardiac. -We appreciate your quick response!  Further workup shows UTI was being treated with intravenous ceftriaxone.  Pt will require admission for further treatment and diagnostics.  She is currently cardiovascularly stable, minimal pain, and will be admitted.       Jones Skene, MD 03/08/12 1557

## 2012-03-05 NOTE — ED Notes (Signed)
Ccm showing a fib with RVR rate 100-140; denies any c/o cp; however, does endorse mid back and abd pain

## 2012-03-05 NOTE — ED Provider Notes (Signed)
History     CSN: [id number]  Arrival date & time 03/05/12  1514   First MD Initiated Contact with Patient 03/05/12 1522      Chief Complaint  Patient presents with  . Altered Mental Status    altered x3 day; normally caox4; presently in rapid a fib rate 90-140 - no hx of same    (Consider location/radiation/quality/duration/timing/severity/associated sxs/prior treatment) The history is provided by the patient, the EMS personnel and medical records.   Theresa Blackwell is a 78 y.o. female presents to the emergency department via EMS complaining of Altered Mental Status.  The onset of the symptoms was  abrupt starting 3 days ago.  The patient has associated abdominal pain.  The symptoms have been  persistent, gradually worsened.  nothing makes the symptoms worse and nothing makes symptoms better.  The patient is a level 5 caveat. She is alert and oriented to person, place and time, but she is unable to carry on a coherent conversation.  EMS states that family on scene puts her baseline at ambulatory, normally Alert and Oriented x4 with brief moments of confusion.  She had a largely unknown medical Hx.  She is complaining of ongoing abdominal pain.  Family reported to EMS that she recently had a colonoscopy at [facility], but no one knows the results of that test.    Past Medical History  Diagnosis Date  . Coronary artery disease   . Breast CA 1970    rt breast  . Anxiety   . Smoking   . Fibromyalgia   . COPD (chronic obstructive pulmonary disease)   . Right arm cellulitis   . Depression   . Hyperlipidemia   . DJD (degenerative joint disease)   . Hypercalcemia   . Adrenal adenoma     Stable as of 05/2012 CT scan    Past Surgical History  Procedure Date  . Breast surgery   . Abdominal hysterectomy   . Appendectomy   . Mastectomy, radical 1970    Right  . Squamous cell carcinoma excision     Floor of mouth  . Colonoscopy w/ biopsies and polypectomy   . Nasal sinus surgery  1994  . Back surgery     Fusion    Family History  Problem Relation Age of Onset  . Colon cancer Brother   . Heart disease Father     History  Substance Use Topics  . Smoking status: Current Every Day Smoker -- 2.0 packs/day for 40 years  . Smokeless tobacco: Never Used  . Alcohol Use: No    OB History    Grav Para Term Preterm Abortions TAB SAB Ect Mult Living                  Review of Systems  Unable to perform ROS Gastrointestinal: Positive for abdominal pain.    Allergies  Review of patient's allergies indicates no known allergies.  Home Medications   Current Outpatient Rx  Name Route Sig Dispense Refill  . ACETAMINOPHEN-CODEINE #3 300-30 MG PO TABS Oral Take 1 tablet by mouth 2 (two) times daily as needed. For pain     . ALBUTEROL SULFATE HFA 108 (90 BASE) MCG/ACT IN AERS Inhalation Inhale 2 puffs into the lungs every 4 (four) hours as needed. Shortness of breath     . CITALOPRAM HYDROBROMIDE 20 MG PO TABS Oral Take 10 mg by mouth daily.     Marland Kitchen DICYCLOMINE HCL 20 MG PO TABS Oral Take 1  tablet (20 mg total) by mouth 3 (three) times daily between meals as needed (stomach pain). 30 tablet 3  . ERGOCALCIFEROL 50000 UNITS PO CAPS Oral Take 50,000 Units by mouth every 14 (fourteen) days.     . FENTANYL 12 MCG/HR TD PT72 Transdermal Place 1 patch (12.5 mcg total) onto the skin every 3 (three) days. 10 patch 0  . FLUTICASONE PROPIONATE 50 MCG/ACT NA SUSP Nasal Place 2 sprays into the nose daily as needed. For allergies     . FUROSEMIDE 40 MG PO TABS Oral Take 1 tablet (40 mg total) by mouth daily. 30 tablet 0  . IPRATROPIUM-ALBUTEROL 0.5-2.5 (3) MG/3ML IN SOLN Nebulization Take 3 mLs by nebulization 3 (three) times daily as needed. Shortness of breath.    Marland Kitchen LISINOPRIL 5 MG PO TABS Oral Take 5 mg by mouth daily.    Marland Kitchen OMEPRAZOLE 20 MG PO CPDR Oral Take 20 mg by mouth daily.      Marland Kitchen POTASSIUM CHLORIDE ER 10 MEQ PO TBCR Oral Take 1 tablet (10 mEq total) by mouth daily. 30  tablet 6  . PREGABALIN 75 MG PO CAPS Oral Take 75 mg by mouth 2 (two) times daily.      Marland Kitchen TIOTROPIUM BROMIDE MONOHYDRATE 18 MCG IN CAPS Inhalation Place 18 mcg into inhaler and inhale daily.      Marland Kitchen TIZANIDINE HCL 4 MG PO CAPS Oral Take 4 mg by mouth at bedtime.      . TRAMADOL HCL 50 MG PO TABS Oral Take 100 mg by mouth every 6 (six) hours as needed. Maximum dose= 8 tablets per day For pain    . TRIAMCINOLONE ACETONIDE 0.1 % EX CREA Topical Apply 1 application topically 2 (two) times daily as needed. Swelling in right arm.    Marland Kitchen TRIMETHOPRIM 100 MG PO TABS Oral Take 100 mg by mouth daily.      BP 110/50  Pulse 62  Temp 97.5 F (36.4 C) (Oral)  Resp 28  SpO2 100%  Physical Exam  Nursing note and vitals reviewed. Constitutional: She appears well-developed and well-nourished. No distress.  HENT:  Head: Normocephalic and atraumatic.  Mouth/Throat: Oropharynx is clear and moist. No oropharyngeal exudate.  Eyes: Conjunctivae normal are normal. Pupils are equal, round, and reactive to light. No scleral icterus.  Neck: Normal range of motion. Neck supple.  Cardiovascular: Normal heart sounds and intact distal pulses.  An irregular rhythm present. Tachycardia present.  PMI is not displaced.  Exam reveals no gallop and no friction rub.   No murmur heard. Pulmonary/Chest: Effort normal and breath sounds normal. No respiratory distress. She has no wheezes. She exhibits no tenderness.       Evidence of well healed R mastectomy with resulting lymphedema.    Abdominal: Soft. Bowel sounds are normal. She exhibits no distension. There is no tenderness.  Musculoskeletal: Normal range of motion. She exhibits no edema and no tenderness.  Lymphadenopathy:    She has no cervical adenopathy.  Neurological: She is alert.       Speech is clear and goal oriented Moves extremities without ataxia  Skin: Skin is warm and dry. No rash noted. She is not diaphoretic. No erythema. There is pallor.  Psychiatric:  She has a normal mood and affect.    ED Course  Procedures (including critical care time)  Labs Reviewed  URINALYSIS, ROUTINE W REFLEX MICROSCOPIC - Abnormal; Notable for the following:    APPearance CLOUDY (*)     Hgb urine dipstick TRACE (*)  Ketones, ur 15 (*)     Leukocytes, UA LARGE (*)     All other components within normal limits  CBC WITH DIFFERENTIAL - Abnormal; Notable for the following:    RBC 3.74 (*)     MCV 100.8 (*)     Neutrophils Relative 86 (*)     Neutro Abs 8.2 (*)     Lymphocytes Relative 10 (*)     All other components within normal limits  COMPREHENSIVE METABOLIC PANEL - Abnormal; Notable for the following:    Potassium 3.1 (*)     Chloride 92 (*)     GFR calc non Af Amer 61 (*)     GFR calc Af Amer 71 (*)     All other components within normal limits  URINE MICROSCOPIC-ADD ON - Abnormal; Notable for the following:    Squamous Epithelial / LPF MANY (*)  MANY   Bacteria, UA FEW (*)  FEW   All other components within normal limits  CBC WITH DIFFERENTIAL - Abnormal; Notable for the following:    RBC 3.63 (*)     MCV 101.7 (*)     Neutrophils Relative 84 (*)     Neutro Abs 8.4 (*)     Lymphocytes Relative 11 (*)     All other components within normal limits  COMPREHENSIVE METABOLIC PANEL - Abnormal; Notable for the following:    Chloride 95 (*)     Glucose, Bld 112 (*)     Albumin 3.3 (*)     GFR calc non Af Amer 58 (*)     GFR calc Af Amer 68 (*)     All other components within normal limits  LACTIC ACID, PLASMA  PROCALCITONIN  TROPONIN I  POCT I-STAT TROPONIN I  LACTIC ACID, PLASMA  PROCALCITONIN  POCT I-STAT TROPONIN I  LIPASE, BLOOD  CK TOTAL AND CKMB  TROPONIN I  TROPONIN I   Dg Chest Port 1 View  03/05/2012  *RADIOLOGY REPORT*  Clinical Data: Altered mental status.  PORTABLE CHEST - 1 VIEW  Comparison: 02/02/2012  Findings: Right mastectomy.  Patient rotated to the right.  Mild hyperinflation.  Mild cardiomegaly.  Right hilar soft  tissue fullness is similar over prior exams back to 2005 as detailed on the 02/02/2012 report. The right apical pleural parenchymal opacity is poorly visualized secondary patient obliquity but felt to be similar to on the prior exams.  Mild interstitial thickening at the left lung base, without lobar consolidation  IMPRESSION:  1. No acute cardiopulmonary disease. 2.  Status post right mastectomy. 3.  Chronic right hilar soft tissue fullness, secondary to pulmonary artery enlargement as on the 06/05/2011 CT.   Original Report Authenticated By: Consuello Bossier, M.D.    Results for orders placed during the hospital encounter of 03/05/12  URINALYSIS, ROUTINE W REFLEX MICROSCOPIC      Component Value Range   Color, Urine YELLOW  YELLOW   APPearance CLOUDY (*) CLEAR   Specific Gravity, Urine 1.010  1.005 - 1.030   pH 6.5  5.0 - 8.0   Glucose, UA NEGATIVE  NEGATIVE mg/dL   Hgb urine dipstick TRACE (*) NEGATIVE   Bilirubin Urine NEGATIVE  NEGATIVE   Ketones, ur 15 (*) NEGATIVE mg/dL   Protein, ur NEGATIVE  NEGATIVE mg/dL   Urobilinogen, UA 0.2  0.0 - 1.0 mg/dL   Nitrite NEGATIVE  NEGATIVE   Leukocytes, UA LARGE (*) NEGATIVE  CBC WITH DIFFERENTIAL      Component Value Range  WBC 9.6  4.0 - 10.5 K/uL   RBC 3.74 (*) 3.87 - 5.11 MIL/uL   Hemoglobin 12.4  12.0 - 15.0 g/dL   HCT 96.0  45.4 - 09.8 %   MCV 100.8 (*) 78.0 - 100.0 fL   MCH 33.2  26.0 - 34.0 pg   MCHC 32.9  30.0 - 36.0 g/dL   RDW 11.9  14.7 - 82.9 %   Platelets 193  150 - 400 K/uL   Neutrophils Relative 86 (*) 43 - 77 %   Neutro Abs 8.2 (*) 1.7 - 7.7 K/uL   Lymphocytes Relative 10 (*) 12 - 46 %   Lymphs Abs 0.9  0.7 - 4.0 K/uL   Monocytes Relative 5  3 - 12 %   Monocytes Absolute 0.4  0.1 - 1.0 K/uL   Eosinophils Relative 0  0 - 5 %   Eosinophils Absolute 0.0  0.0 - 0.7 K/uL   Basophils Relative 0  0 - 1 %   Basophils Absolute 0.0  0.0 - 0.1 K/uL  COMPREHENSIVE METABOLIC PANEL      Component Value Range   Sodium 138  135 - 145  mEq/L   Potassium 3.1 (*) 3.5 - 5.1 mEq/L   Chloride 92 (*) 96 - 112 mEq/L   CO2 23  19 - 32 mEq/L   Glucose, Bld 99  70 - 99 mg/dL   BUN 16  6 - 23 mg/dL   Creatinine, Ser 5.62  0.50 - 1.10 mg/dL   Calcium 9.1  8.4 - 13.0 mg/dL   Total Protein 6.9  6.0 - 8.3 g/dL   Albumin 3.6  3.5 - 5.2 g/dL   AST 23  0 - 37 U/L   ALT 8  0 - 35 U/L   Alkaline Phosphatase 69  39 - 117 U/L   Total Bilirubin 0.5  0.3 - 1.2 mg/dL   GFR calc non Af Amer 61 (*) >90 mL/min   GFR calc Af Amer 71 (*) >90 mL/min  LACTIC ACID, PLASMA      Component Value Range   Lactic Acid, Venous 1.4  0.5 - 2.2 mmol/L  PROCALCITONIN      Component Value Range   Procalcitonin 0.16    TROPONIN I      Component Value Range   Troponin I <0.30  <0.30 ng/mL  POCT I-STAT TROPONIN I      Component Value Range   Troponin i, poc 0.06  0.00 - 0.08 ng/mL   Comment 3           URINE MICROSCOPIC-ADD ON      Component Value Range   Squamous Epithelial / LPF MANY (*) RARE   WBC, UA 11-20  <3 WBC/hpf   RBC / HPF 0-2  <3 RBC/hpf   Bacteria, UA FEW (*) RARE  CBC WITH DIFFERENTIAL      Component Value Range   WBC 10.0  4.0 - 10.5 K/uL   RBC 3.63 (*) 3.87 - 5.11 MIL/uL   Hemoglobin 12.0  12.0 - 15.0 g/dL   HCT 86.5  78.4 - 69.6 %   MCV 101.7 (*) 78.0 - 100.0 fL   MCH 33.1  26.0 - 34.0 pg   MCHC 32.5  30.0 - 36.0 g/dL   RDW 29.5  28.4 - 13.2 %   Platelets 203  150 - 400 K/uL   Neutrophils Relative 84 (*) 43 - 77 %   Neutro Abs 8.4 (*) 1.7 - 7.7 K/uL   Lymphocytes  Relative 11 (*) 12 - 46 %   Lymphs Abs 1.1  0.7 - 4.0 K/uL   Monocytes Relative 5  3 - 12 %   Monocytes Absolute 0.5  0.1 - 1.0 K/uL   Eosinophils Relative 0  0 - 5 %   Eosinophils Absolute 0.0  0.0 - 0.7 K/uL   Basophils Relative 0  0 - 1 %   Basophils Absolute 0.0  0.0 - 0.1 K/uL  COMPREHENSIVE METABOLIC PANEL      Component Value Range   Sodium 137  135 - 145 mEq/L   Potassium 3.6  3.5 - 5.1 mEq/L   Chloride 95 (*) 96 - 112 mEq/L   CO2 25  19 - 32 mEq/L    Glucose, Bld 112 (*) 70 - 99 mg/dL   BUN 16  6 - 23 mg/dL   Creatinine, Ser 4.54  0.50 - 1.10 mg/dL   Calcium 8.9  8.4 - 09.8 mg/dL   Total Protein 6.5  6.0 - 8.3 g/dL   Albumin 3.3 (*) 3.5 - 5.2 g/dL   AST 18  0 - 37 U/L   ALT 7  0 - 35 U/L   Alkaline Phosphatase 71  39 - 117 U/L   Total Bilirubin 0.5  0.3 - 1.2 mg/dL   GFR calc non Af Amer 58 (*) >90 mL/min   GFR calc Af Amer 68 (*) >90 mL/min  LACTIC ACID, PLASMA      Component Value Range   Lactic Acid, Venous 1.2  0.5 - 2.2 mmol/L  PROCALCITONIN      Component Value Range   Procalcitonin 0.16    POCT I-STAT TROPONIN I      Component Value Range   Troponin i, poc 0.06  0.00 - 0.08 ng/mL   Comment 3            Dg Chest Port 1 View  03/05/2012  *RADIOLOGY REPORT*  Clinical Data: Altered mental status.  PORTABLE CHEST - 1 VIEW  Comparison: 02/02/2012  Findings: Right mastectomy.  Patient rotated to the right.  Mild hyperinflation.  Mild cardiomegaly.  Right hilar soft tissue fullness is similar over prior exams back to 2005 as detailed on the 02/02/2012 report. The right apical pleural parenchymal opacity is poorly visualized secondary patient obliquity but felt to be similar to on the prior exams.  Mild interstitial thickening at the left lung base, without lobar consolidation  IMPRESSION:  1. No acute cardiopulmonary disease. 2.  Status post right mastectomy. 3.  Chronic right hilar soft tissue fullness, secondary to pulmonary artery enlargement as on the 06/05/2011 CT.   Original Report Authenticated By: Consuello Bossier, M.D.      ECG:  Date: 03/05/2012  Rate: 69  Rhythm: normal sinus rhythm  QRS Axis: left  Intervals: QT prolonged  ST/T Wave abnormalities: nonspecific T wave changes  Conduction Disutrbances:none  Narrative Interpretation: Sinus with T wave inversion in II, III, aVF, V2-V6  Old EKG Reviewed: changes noted    1. Shortness of breath   2. UTI (lower urinary tract infection)   3. Altered mental status        MDM  Margarite Gouge via EMS with altered mental status times approximately 3 days. There is no family with the patient. Concern for cardiac causes based on abnormal ECG. Will evaluate for other reasons for altered mental status. Cardiology consult to evaluate the patient for possible left main disease vs new onset of regular heart rhythm.  Dr. Rulon Abide  performed a fast scan without evidence of abdominal aortic aneurysm.  Patient with pain over the pancreatic area.  Initial troponin negative at 0.06.  Patient given one dose of Cardizem.  Cardiology is involved and will follow. Cath urinalysis with bacteria and leukocytes.  Begin IV Rocephin to treat urinary tract infection.   Patient to find and repeat troponin negative.  Her calcitonin and lactic acid without evidence of sepsis.  She does not meet SIRS criteria, but does remain altered in the emergency department.  Admitted to triad for altered mental status.  [facility] will continue to follow.      Dahlia Client Nazir Hacker, PA-C 03/06/12 [phone]

## 2012-03-05 NOTE — ED Notes (Signed)
Pt has now flipped her T waves; repeat 12 lead EKG done - shown to EDP

## 2012-03-05 NOTE — ED Notes (Signed)
Echo in progress.

## 2012-03-05 NOTE — ED Notes (Signed)
Plan of care discussed with pt and family - all verbalized understanding

## 2012-03-05 NOTE — Consult Note (Signed)
CARDIOLOGY CONSULT NOTE   Patient ID: Theresa Blackwell MRN: [id number] DOB/AGE: 01-07-1931 78 y.o.  Admit date: 03/05/2012  Primary Physician   Georgann Housekeeper, MD Primary Cardiologist   New to cardiology Reason for Consultation   Afib and chest pain  HPI:Theresa Blackwell is a 78 y.o. female with a history of CAD.  This morning, before dawn, she was awoken by 10/10 substernal chest pain with +Lavine sign. States that EMS was then called. Reports having SOB. Denies having anything like this in the past, but is inconsistent in her is story.     Patient is a questionable historian.       Past Medical History  Diagnosis Date  . Coronary artery disease   . Breast CA 1970    rt breast  . Anxiety   . Smoking   . Fibromyalgia   . COPD (chronic obstructive pulmonary disease)   . Right arm cellulitis   . Depression   . Hyperlipidemia   . DJD (degenerative joint disease)   . Hypercalcemia    Past Surgical History  Procedure Date  . Breast surgery   . Abdominal hysterectomy   . Appendectomy   . Mastectomy, radical 1970    Right  . Squamous cell carcinoma excision     Floor of mouth  . Colonoscopy w/ biopsies and polypectomy   . Nasal sinus surgery 1994  . Back surgery     Fusion    No Known Allergies  I have reviewed the patient's current medications    . fentaNYL  50 mcg Intravenous Once  . ondansetron (ZOFRAN) IV  4 mg Intravenous Once   . diltiazem (CARDIZEM) infusion 10 mg/hr (03/05/12 1540)   Medication Sig  acetaminophen-codeine (TYLENOL #3) 300-30 MG per tablet Take 1 tablet by mouth 2 (two) times daily as needed. For pain   albuterol (PROVENTIL HFA;VENTOLIN HFA) 108 (90 BASE) MCG/ACT inhaler Inhale 2 puffs into the lungs every 4 (four) hours as needed. Shortness of breath   citalopram (CELEXA) 20 MG tablet Take 10 mg by mouth daily.   dicyclomine (BENTYL) 20 MG tablet Take 1 tablet (20 mg total) by mouth 3 (three) times daily between meals as needed (stomach  pain).  ergocalciferol (VITAMIN D2) 50000 UNITS capsule Take 50,000 Units by mouth every 14 (fourteen) days.   fentaNYL (DURAGESIC - DOSED MCG/HR) 12 MCG/HR Place 1 patch (12.5 mcg total) onto the skin every 3 (three) days.  fluticasone (FLONASE) 50 MCG/ACT nasal spray Place 2 sprays into the nose daily as needed. For allergies   furosemide (LASIX) 40 MG tablet Take 1 tablet (40 mg total) by mouth daily.  ipratropium-albuterol (DUONEB) 0.5-2.5 (3) MG/3ML SOLN Take 3 mLs by nebulization 3 (three) times daily as needed. Shortness of breath.  lisinopril (PRINIVIL,ZESTRIL) 5 MG tablet Take 5 mg by mouth daily.  omeprazole (PRILOSEC) 20 MG capsule Take 20 mg by mouth daily.    potassium chloride (K-DUR) 10 MEQ tablet Take 1 tablet (10 mEq total) by mouth daily.  pregabalin (LYRICA) 75 MG capsule Take 75 mg by mouth 2 (two) times daily.    tiotropium (SPIRIVA) 18 MCG inhalation capsule Place 18 mcg into inhaler and inhale daily.    tiZANidine (ZANAFLEX) 4 MG capsule Take 4 mg by mouth at bedtime.    traMADol (ULTRAM) 50 MG tablet Take 100 mg by mouth every 6 (six) hours as needed. Maximum dose= 8 tablets per day For pain  triamcinolone cream (KENALOG) 0.1 % Apply 1 application topically  2 (two) times daily as needed. Swelling in right arm.  trimethoprim (TRIMPEX) 100 MG tablet Take 100 mg by mouth daily.     History   Social History  . Marital Status: Widowed    Spouse Name: N/A    Number of Children: 2  . Years of Education: N/A   Occupational History  . Retired    Social History Main Topics  . Smoking status: Current Every Day Smoker -- 2.0 packs/day for 40 years  . Smokeless tobacco: Never Used  . Alcohol Use: No  . Drug Use: No  . Sexually Active: Not on file   Other Topics Concern  . Not on file   Social History Narrative  . Pt grandson lives with her.    Family History  Problem Relation Age of Onset  . Colon cancer Brother   . Heart disease Father     Family Status    Relation Status Death Age  . Father Deceased     + CAD, gallbladder prob  . Mother Deceased     Old age  . Brother Alive     Cancer  . Brother Alive      No CAD  . Sister Deceased     No CAD  . Sister Alive     No CAD  . Sister Deceased     No CAD  . Sister Alive     No CAD  . Sister Deceased     No CAD    ROS:  Full 14 point review of systems complete and found to be negative unless listed above.  Physical Exam: Blood pressure 117/45, pulse 120, temperature 97.5 F (36.4 C), temperature source Oral, SpO2 98.00%.  General: Eldery, female in mild acute distress Head: Eyes PERRLA, No xanthomas.   Normocephalic and atraumatic. Dentition - poor Lungs: bilateral basilar rales Heart: Heart irregular rate and rhythm with S1, S2; no clinically significant murmur. pulses are 2+ all 4 extrem.   Neck: No carotid bruits. No lymphadenopathy.  JVD not elevated. Abdomen: Bowel sounds present, abdomen soft and diffusely tender without masses or hernias noted. Msk:  No spine or cva tenderness. No weakness, no joint deformities or effusions. Extremities: No clubbing or cyanosis. No lower extremity edema. Left UE with compression sleeve for lymphedema  Neuro: Alert and oriented X 3. No focal deficits noted. Global weakness noted. Able to pick both legs up off of the bed with encouragement.  Psych:  Flattened affect, questionable historian. Follows commands appropriately. Skin: No rashes or lesions noted.  Labs:  Pro B Natriuretic peptide (BNP)  Date/Time Value Range Status  06/07/2011  9:03 AM 5213.0* 0 - 450 pg/mL Final  06/05/2011 11:05 PM 6826.0* 0 - 450 pg/mL Final   TSH  Date/Time Value Range Status  02/02/2012 11:05 AM 0.402  0.350 - 4.500 uIU/mL Final   Echo: 06/07/2011 - Study Conclusions - Left ventricle: Poor endocardial definition The cavity size was normal. Wall thickness was increased in a pattern of moderate LVH. Systolic function was mildly to moderately reduced. The  estimated ejection fraction was in the range of 40% to 45%. Diffuse hypokinesis. - Aortic valve: Trivial regurgitation.  ECG:24-Feb-2012 15:20:57  ATRIAL FLUTTER/FIBRILLATION, A-RATE 231 ~ multiple Ps -  Per SK, NO afib, actually MAT. LAD, CONSIDER LEFT ANTERIOR FASCICULAR BLOCK ~ axis(240,-40), S>R II III aVF LVH WITH SECONDARY REPOLARIZATION ABNORMALITY ~ R56L/RISIII/S12R56/S3RL & rep abn ANTERIOR Q WAVES, POSSIBLY DUE TO LVH ~ Q >19mS, V1 V2 & LVH Vent. rate  91 BPM PR interval * ms QRS duration 108 ms QT/QTc 412/507 ms P-R-T axes * -52 172  Radiology: CXR 03/05/2012 16:09 IMPRESSION: 1. No acute cardiopulmonary disease. 2. Status post right mastectomy. 3. Chronic right hilar soft tissue fullness, secondary to pulmonary artery enlargement as on the 06/05/2011 CT.  ASSESSMENT AND PLAN:   The patient was seen today by Dr Graciela Husbands, the patient evaluated and the data reviewed.   1. Left shoulder pain, possible anginal equivalent - telemetry shows ST changes of unclear significance, and intermittent MAT, initial POC Troponin negative, f/u ez pending. Continue to cycle but symptoms do not seem to be primarily cardiac in etiology, continue to follow.   2. ?PAF - without history of Afib in the past, MAT on telemetry now, think elevated rate secondary to underlying pain/abd problem, will give Magnesium, MD advise on Verapamil.   3. Abdominal pain - has had extensive OP workup. Scheduled for EUS/upper endoscopy on 03/20/2012. Saw Dr Christella Hartigan yesterday. Per GI.  Signed: Theodore Demark 03/05/2012, 4:18 PM Co-Sign MD Pt with striking story of recurring abdominal pain, but also with complaints of L shoulder pain.  The hsitory has been quite inconsistent though, so input from family members supporting the recurring nature of the primarily abdominal complaints.  The ECG changes are new and worrisome, but initial Tn are negative.  ECG consistent with MAT likely occuring in the context of COPD.    We will  follow with you [facility] Echo Serial Troponin No specific ACS Rx Mg for MAT Consider verapamil although it may aggravate consitipation which may be another problem\ Could she have mesenteric ischemia Sherryl Manges, MD 03/05/2012 10:08 PM

## 2012-03-05 NOTE — ED Notes (Signed)
cardiology in to assess

## 2012-03-05 NOTE — ED Notes (Signed)
Ccm remains atrial fib rate b/w 90 - 124; cardizem drip infusing at 10 mg/hr per pump via PIV to LFA

## 2012-03-06 ENCOUNTER — Inpatient Hospital Stay (HOSPITAL_COMMUNITY): Payer: Medicare Other

## 2012-03-06 ENCOUNTER — Encounter (HOSPITAL_COMMUNITY): Payer: Self-pay | Admitting: Family Medicine

## 2012-03-06 DIAGNOSIS — F419 Anxiety disorder, unspecified: Secondary | ICD-10-CM

## 2012-03-06 DIAGNOSIS — Z853 Personal history of malignant neoplasm of breast: Secondary | ICD-10-CM

## 2012-03-06 DIAGNOSIS — J9610 Chronic respiratory failure, unspecified whether with hypoxia or hypercapnia: Secondary | ICD-10-CM

## 2012-03-06 DIAGNOSIS — I498 Other specified cardiac arrhythmias: Secondary | ICD-10-CM

## 2012-03-06 DIAGNOSIS — M797 Fibromyalgia: Secondary | ICD-10-CM

## 2012-03-06 DIAGNOSIS — R109 Unspecified abdominal pain: Secondary | ICD-10-CM

## 2012-03-06 DIAGNOSIS — Z720 Tobacco use: Secondary | ICD-10-CM

## 2012-03-06 DIAGNOSIS — I471 Supraventricular tachycardia: Secondary | ICD-10-CM | POA: Diagnosis present

## 2012-03-06 LAB — BASIC METABOLIC PANEL
Chloride: 96 mEq/L (ref 96–112)
GFR calc Af Amer: 64 mL/min — ABNORMAL LOW (ref >90)
GFR calc non Af Amer: 55 mL/min — ABNORMAL LOW (ref >90)
Glucose, Bld: 83 mg/dL (ref 70–99)
Potassium: 3 mEq/L — ABNORMAL LOW (ref 3.5–5.1)
Sodium: 138 mEq/L (ref 135–145)

## 2012-03-06 LAB — TROPONIN I: Troponin I: <0.3 ng/mL (ref <0.30)

## 2012-03-06 LAB — CBC
HCT: 34.4 % — ABNORMAL LOW (ref 36.0–46.0)
Hemoglobin: 11.1 g/dL — ABNORMAL LOW (ref 12.0–15.0)
MCHC: 32.3 g/dL (ref 30.0–36.0)
WBC: 10.1 10*3/uL (ref 4.0–10.5)

## 2012-03-06 LAB — LIPASE, BLOOD: Lipase: 54 U/L (ref 11–59)

## 2012-03-06 MED ORDER — POTASSIUM CHLORIDE ER 10 MEQ PO TBCR
10.0000 meq | EXTENDED_RELEASE_TABLET | Freq: Every day | ORAL | Status: DC
  Administered 2012-03-06: 10 meq via ORAL
  Filled 2012-03-06 (×2): qty 1

## 2012-03-06 MED ORDER — LORAZEPAM 0.5 MG PO TABS
0.0000 mg | ORAL_TABLET | Freq: Four times a day (QID) | ORAL | Status: DC
  Administered 2012-03-06: 1 mg via ORAL
  Filled 2012-03-06: qty 2

## 2012-03-06 MED ORDER — DICYCLOMINE HCL 20 MG PO TABS
20.0000 mg | ORAL_TABLET | Freq: Three times a day (TID) | ORAL | Status: DC | PRN
  Filled 2012-03-06: qty 1

## 2012-03-06 MED ORDER — ACETAMINOPHEN 325 MG PO TABS
650.0000 mg | ORAL_TABLET | Freq: Four times a day (QID) | ORAL | Status: DC | PRN
  Administered 2012-03-07 – 2012-03-09 (×4): 650 mg via ORAL
  Filled 2012-03-06 (×4): qty 2

## 2012-03-06 MED ORDER — SODIUM CHLORIDE 0.9 % IJ SOLN: 3.0000 mL | INTRAMUSCULAR | Status: DC | PRN

## 2012-03-06 MED ORDER — ACETAMINOPHEN 650 MG RE SUPP: 650.0000 mg | Freq: Four times a day (QID) | RECTAL | Status: DC | PRN

## 2012-03-06 MED ORDER — IOHEXOL 350 MG/ML SOLN
100.0000 mL | Freq: Once | INTRAVENOUS | Status: AC | PRN
  Administered 2012-03-06: 100 mL via INTRAVENOUS

## 2012-03-06 MED ORDER — VERAPAMIL HCL ER 120 MG PO TBCR
120.0000 mg | EXTENDED_RELEASE_TABLET | Freq: Every day | ORAL | Status: DC
  Administered 2012-03-06 – 2012-03-10 (×5): 120 mg via ORAL
  Filled 2012-03-06 (×5): qty 1

## 2012-03-06 MED ORDER — FOLIC ACID 1 MG PO TABS
1.0000 mg | ORAL_TABLET | Freq: Every day | ORAL | Status: DC
  Administered 2012-03-06 – 2012-03-10 (×5): 1 mg via ORAL
  Filled 2012-03-06 (×5): qty 1

## 2012-03-06 MED ORDER — SODIUM CHLORIDE 0.9 % IV SOLN
250.0000 mL | INTRAVENOUS | Status: DC | PRN
  Administered 2012-03-06: 250 mL via INTRAVENOUS

## 2012-03-06 MED ORDER — BISACODYL 5 MG PO TBEC
5.0000 mg | DELAYED_RELEASE_TABLET | Freq: Every day | ORAL | Status: DC | PRN
  Administered 2012-03-09: 5 mg via ORAL
  Filled 2012-03-06: qty 1

## 2012-03-06 MED ORDER — ENOXAPARIN SODIUM 40 MG/0.4ML ~~LOC~~ SOLN
40.0000 mg | SUBCUTANEOUS | Status: DC
  Administered 2012-03-06 – 2012-03-10 (×5): 40 mg via SUBCUTANEOUS
  Filled 2012-03-06 (×5): qty 0.4

## 2012-03-06 MED ORDER — VERAPAMIL HCL ER 120 MG PO TBCR
120.0000 mg | EXTENDED_RELEASE_TABLET | Freq: Once | ORAL | Status: AC
  Administered 2012-03-06: 120 mg via ORAL
  Filled 2012-03-06: qty 1

## 2012-03-06 MED ORDER — DEXTROSE 5 % IV SOLN
1.0000 g | INTRAVENOUS | Status: DC
  Administered 2012-03-06 – 2012-03-07 (×2): 1 g via INTRAVENOUS
  Filled 2012-03-06 (×3): qty 10

## 2012-03-06 MED ORDER — NICOTINE 14 MG/24HR TD PT24
14.0000 mg | MEDICATED_PATCH | Freq: Every day | TRANSDERMAL | Status: DC
  Administered 2012-03-06 – 2012-03-10 (×5): 14 mg via TRANSDERMAL
  Filled 2012-03-06 (×5): qty 1

## 2012-03-06 MED ORDER — LORAZEPAM 2 MG/ML IJ SOLN: 1.0000 mg | Freq: Four times a day (QID) | INTRAMUSCULAR | Status: AC | PRN

## 2012-03-06 MED ORDER — FENTANYL 25 MCG/HR TD PT72
25.0000 ug | MEDICATED_PATCH | TRANSDERMAL | Status: DC
  Administered 2012-03-06 – 2012-03-09 (×2): 25 ug via TRANSDERMAL
  Filled 2012-03-06 (×2): qty 1

## 2012-03-06 MED ORDER — ONDANSETRON HCL 4 MG PO TABS: 4.0000 mg | ORAL_TABLET | Freq: Four times a day (QID) | ORAL | Status: DC | PRN

## 2012-03-06 MED ORDER — ALBUTEROL SULFATE (5 MG/ML) 0.5% IN NEBU: 2.5000 mg | INHALATION_SOLUTION | RESPIRATORY_TRACT | Status: DC | PRN

## 2012-03-06 MED ORDER — FENTANYL 12 MCG/HR TD PT72: 12.5000 ug | MEDICATED_PATCH | TRANSDERMAL | Status: DC

## 2012-03-06 MED ORDER — TIZANIDINE HCL 4 MG PO TABS
4.0000 mg | ORAL_TABLET | Freq: Every day | ORAL | Status: DC
  Administered 2012-03-06 – 2012-03-09 (×4): 4 mg via ORAL
  Filled 2012-03-06 (×5): qty 1

## 2012-03-06 MED ORDER — INFLUENZA VIRUS VACC SPLIT PF IM SUSP
0.5000 mL | INTRAMUSCULAR | Status: AC
  Administered 2012-03-07: 0.5 mL via INTRAMUSCULAR
  Filled 2012-03-06: qty 0.5

## 2012-03-06 MED ORDER — SODIUM CHLORIDE 0.9 % IJ SOLN
3.0000 mL | Freq: Two times a day (BID) | INTRAMUSCULAR | Status: DC
  Administered 2012-03-06 – 2012-03-10 (×7): 3 mL via INTRAVENOUS

## 2012-03-06 MED ORDER — ENSURE COMPLETE PO LIQD
237.0000 mL | Freq: Two times a day (BID) | ORAL | Status: DC
  Administered 2012-03-06 – 2012-03-10 (×7): 237 mL via ORAL

## 2012-03-06 MED ORDER — VITAMIN B-1 100 MG PO TABS
100.0000 mg | ORAL_TABLET | Freq: Every day | ORAL | Status: DC
  Administered 2012-03-06 – 2012-03-10 (×5): 100 mg via ORAL
  Filled 2012-03-06 (×5): qty 1

## 2012-03-06 MED ORDER — ONDANSETRON HCL 4 MG/2ML IJ SOLN: 4.0000 mg | Freq: Four times a day (QID) | INTRAMUSCULAR | Status: DC | PRN

## 2012-03-06 MED ORDER — IPRATROPIUM BROMIDE 0.02 % IN SOLN: 0.5000 mg | RESPIRATORY_TRACT | Status: DC | PRN

## 2012-03-06 MED ORDER — ADULT MULTIVITAMIN W/MINERALS CH
1.0000 | ORAL_TABLET | Freq: Every day | ORAL | Status: DC
  Administered 2012-03-06 – 2012-03-10 (×5): 1 via ORAL
  Filled 2012-03-06 (×5): qty 1

## 2012-03-06 MED ORDER — TIOTROPIUM BROMIDE MONOHYDRATE 18 MCG IN CAPS
18.0000 ug | ORAL_CAPSULE | Freq: Every day | RESPIRATORY_TRACT | Status: DC
  Administered 2012-03-06 – 2012-03-10 (×5): 18 ug via RESPIRATORY_TRACT
  Filled 2012-03-06 (×2): qty 5

## 2012-03-06 MED ORDER — METOPROLOL TARTRATE 12.5 MG HALF TABLET
12.5000 mg | ORAL_TABLET | Freq: Two times a day (BID) | ORAL | Status: DC
  Filled 2012-03-06 (×2): qty 1

## 2012-03-06 MED ORDER — CITALOPRAM HYDROBROMIDE 10 MG PO TABS
10.0000 mg | ORAL_TABLET | Freq: Every day | ORAL | Status: DC
  Filled 2012-03-06: qty 1

## 2012-03-06 MED ORDER — PREGABALIN 25 MG PO CAPS
75.0000 mg | ORAL_CAPSULE | Freq: Two times a day (BID) | ORAL | Status: DC
  Administered 2012-03-06 – 2012-03-10 (×9): 75 mg via ORAL
  Filled 2012-03-06 (×9): qty 3

## 2012-03-06 MED ORDER — LORAZEPAM 0.5 MG PO TABS: 0.0000 mg | ORAL_TABLET | Freq: Two times a day (BID) | ORAL | Status: DC

## 2012-03-06 MED ORDER — MORPHINE SULFATE 2 MG/ML IJ SOLN
2.0000 mg | INTRAMUSCULAR | Status: DC | PRN
  Filled 2012-03-06: qty 1

## 2012-03-06 MED ORDER — BUSPIRONE HCL 5 MG PO TABS
5.0000 mg | ORAL_TABLET | Freq: Two times a day (BID) | ORAL | Status: DC
  Administered 2012-03-06 – 2012-03-10 (×9): 5 mg via ORAL
  Filled 2012-03-06 (×10): qty 1

## 2012-03-06 MED ORDER — FLUTICASONE PROPIONATE 50 MCG/ACT NA SUSP
2.0000 | Freq: Every day | NASAL | Status: DC | PRN
  Administered 2012-03-07: 2 via NASAL
  Filled 2012-03-06: qty 16

## 2012-03-06 MED ORDER — ALUM & MAG HYDROXIDE-SIMETH 200-200-20 MG/5ML PO SUSP
30.0000 mL | Freq: Four times a day (QID) | ORAL | Status: DC | PRN
  Administered 2012-03-09: 30 mL via ORAL
  Filled 2012-03-06: qty 30

## 2012-03-06 MED ORDER — POTASSIUM CHLORIDE CRYS ER 20 MEQ PO TBCR
40.0000 meq | EXTENDED_RELEASE_TABLET | Freq: Once | ORAL | Status: AC
  Administered 2012-03-06: 40 meq via ORAL
  Filled 2012-03-06: qty 2

## 2012-03-06 MED ORDER — PANTOPRAZOLE SODIUM 40 MG PO TBEC
40.0000 mg | DELAYED_RELEASE_TABLET | Freq: Every day | ORAL | Status: DC
  Administered 2012-03-06 – 2012-03-10 (×5): 40 mg via ORAL
  Filled 2012-03-06 (×5): qty 1

## 2012-03-06 MED ORDER — LORAZEPAM 0.5 MG PO TABS: 1.0000 mg | ORAL_TABLET | Freq: Four times a day (QID) | ORAL | Status: AC | PRN

## 2012-03-06 MED ORDER — IOHEXOL 350 MG/ML SOLN
60.0000 mL | Freq: Once | INTRAVENOUS | Status: AC | PRN
  Administered 2012-03-06: 60 mL via INTRAVENOUS

## 2012-03-06 MED ORDER — THIAMINE HCL 100 MG/ML IJ SOLN
100.0000 mg | Freq: Every day | INTRAMUSCULAR | Status: DC
  Filled 2012-03-06 (×5): qty 1

## 2012-03-06 MED ORDER — GI COCKTAIL ~~LOC~~
30.0000 mL | Freq: Once | ORAL | Status: AC
  Administered 2012-03-06: 30 mL via ORAL
  Filled 2012-03-06: qty 30

## 2012-03-06 NOTE — Progress Notes (Signed)
INITIAL ADULT NUTRITION ASSESSMENT Date: 03/06/2012   Time: 10:03 AM Reason for Assessment: MST (Malnutrition Screening Tool)  INTERVENTION: 1. Ensure Complete po BID, each supplement provides 350 kcal and 13 grams of protein. 2. Continue multivitamin  3. RD will continue to follow   DOCUMENTATION CODES Per approved criteria  -Severe malnutrition in the context of chronic illness     ASSESSMENT: Female 78 y.o.  Dx: SOB, abd pain, UTI  Hx:  Past Medical History  Diagnosis Date  . Coronary artery disease   . Breast CA 1970    rt breast  . Anxiety   . Smoking   . Fibromyalgia   . COPD (chronic obstructive pulmonary disease)   . Right arm cellulitis   . Depression   . Hyperlipidemia   . DJD (degenerative joint disease)   . Hypercalcemia   . Adrenal adenoma     Stable as of 05/2012 CT scan  . Chronic respiratory failure     Related Meds:     . busPIRone  5 mg Oral BID  . cefTRIAXone (ROCEPHIN)  IV  1 g Intravenous Once  . cefTRIAXone (ROCEPHIN)  IV  1 g Intravenous Q24H  . enoxaparin (LOVENOX) injection  40 mg Subcutaneous Q24H  . fentaNYL  25 mcg Transdermal Q72H  . fentaNYL  50 mcg Intravenous Once  . folic acid  1 mg Oral Daily  . gi cocktail  30 mL Oral Once  . influenza  inactive virus vaccine  0.5 mL Intramuscular Tomorrow-1000  . LORazepam  0-4 mg Oral Q6H   Followed by  . LORazepam  0-4 mg Oral Q12H  . magnesium sulfate 1 - 4 g bolus IVPB  2 g Intravenous Once  . multivitamin with minerals  1 tablet Oral Daily  . nicotine  14 mg Transdermal Daily  . ondansetron (ZOFRAN) IV  4 mg Intravenous Once  . pantoprazole  40 mg Oral Q1200  . potassium chloride  10 mEq Oral Daily  . potassium chloride  40 mEq Oral Once  . pregabalin  75 mg Oral BID  . sodium chloride  3 mL Intravenous Q12H  . thiamine  100 mg Oral Daily   Or  . thiamine  100 mg Intravenous Daily  . tiotropium  18 mcg Inhalation Daily  . tiZANidine  4 mg Oral QHS  . verapamil  120 mg Oral  Daily  . DISCONTD: citalopram  10 mg Oral Daily  . DISCONTD: fentaNYL  12.5 mcg Transdermal Q72H  . DISCONTD: metoprolol tartrate  12.5 mg Oral BID     Ht: 5\' 5"  (165.1 cm)  Wt: 148 lb 2.4 oz (67.2 kg) (bed scale; pt unable to stand; very unsteady)  Ideal Wt: 56.8 kg  % Ideal Wt: 118%  Usual Wt: ~165 lbs per pt report Wt Readings from Last 5 Encounters:  03/06/12 148 lb 2.4 oz (67.2 kg)  03/04/12 153 lb (69.4 kg)  02/05/12 167 lb 8.8 oz (76 kg)  06/10/11 179 lb 3.2 oz (81.285 kg)    % Usual Wt: 90%  Body mass index is 24.65 kg/(m^2). Pt is WNL per current BMI   Food/Nutrition Related Hx: Pt reports poor appetite and intake with weight loss per MST (Malnutrition Screening Tool)   Labs:  CMP     Component Value Date/Time   NA 138 03/06/2012 0425   K 3.0* 03/06/2012 0425   CL 96 03/06/2012 0425   CO2 30 03/06/2012 0425   GLUCOSE 83 03/06/2012 0425   BUN  16 03/06/2012 0425   CREATININE 0.96 03/06/2012 0425   CALCIUM 8.6 03/06/2012 0425   PROT 6.5 03/05/2012 2054   ALBUMIN 3.3* 03/05/2012 2054   AST 18 03/05/2012 2054   ALT 7 03/05/2012 2054   ALKPHOS 71 03/05/2012 2054   BILITOT 0.5 03/05/2012 2054   GFRNONAA 55* 03/06/2012 0425   GFRAA 64* 03/06/2012 0425    Intake/Output Summary (Last 24 hours) at 03/06/12 1006 Last data filed at 03/06/12 0600  Gross per 24 hour  Intake 383.33 ml  Output      0 ml  Net 383.33 ml    Diet Order: Cardiac  Supplements/Tube Feeding: none   IVF:    DISCONTD: diltiazem (CARDIZEM) infusion Last Rate: Stopped (03/06/12 0756)    Estimated Nutritional Needs:   Kcal: 1500-1700 Protein: 65-75 gm  Fluid:  1.5-1.7 L   Pt admitted with hx of chronic abdominal pain and SOB.  Per pts report, she has not had an appetite for several months, eating only when other would eat, often skipping one or more meals daily. Per weight hx, pt has lost 13 lbs (11%) in the last month.  Pt meets criteria for severe malnutrition in the context of chronic illness  2/2 weight loss of 11% in 1 month and </=75% intake of estimated needs for >/= 1 month.  Pt would like Ensure daily, RD will order.   NUTRITION DIAGNOSIS: -Inadequate oral intake (NI-2.1).  Status: Ongoing  RELATED TO: poor appetite   AS EVIDENCE BY: weight loss of 11% in 1 month   MONITORING/EVALUATION(Goals): Goal: po intake to meet >/=90% estimated nutrition needs Monitor: PO intake, weight, labs, I/O's  EDUCATION NEEDS: -No education needs identified at this time   Clarene Duke RD, LDN Pager [phone] After Hours pager [phone]  03/06/2012, 10:03 AM

## 2012-03-06 NOTE — ED Notes (Signed)
Pt placed on bedpan

## 2012-03-06 NOTE — Progress Notes (Signed)
Subjective: Pt with Abd pain- chronic COPD/ Tob USE MAT- on Diltazem Gtt Feel weak  Objective: Vital signs in last 24 hours: Temp:  [97.5 F (36.4 C)-99.6 F (37.6 C)] 99.6 F (37.6 C) (09/12 0412) Pulse Rate:  [61-120] 61  (09/12 0412) Resp:  [15-28] 20  (09/12 0412) BP: (105-142)/(45-94) 105/57 mmHg (09/12 0412) SpO2:  [97 %-100 %] 97 % (09/12 0412) Weight:  [67.2 kg (148 lb 2.4 oz)] 67.2 kg (148 lb 2.4 oz) (09/12 0412) Weight change:  Last BM Date: 03/04/12  Intake/Output from previous day: 09/11 0701 - 09/12 0700 In: 383.3 [P.O.:240; I.V.:143.3] Out: -  Intake/Output this shift:    General appearance: alert Resp: clear to auscultation bilaterally Cardio: regular rate and rhythm GI: soft, non-tender; bowel sounds normal; no masses,  no organomegaly  Lab Results:  Basename 03/06/12 0425 03/05/12 2054  WBC 10.1 10.0  HGB 11.1* 12.0  HCT 34.4* 36.9  PLT 182 203   BMET  Basename 03/06/12 0425 03/05/12 2054  NA 138 137  K 3.0* 3.6  CL 96 95*  CO2 30 25  GLUCOSE 83 112*  BUN 16 16  CREATININE 0.96 0.91  CALCIUM 8.6 8.9    Studies/Results: Dg Chest Port 1 View  03/05/2012  *RADIOLOGY REPORT*  Clinical Data: Altered mental status.  PORTABLE CHEST - 1 VIEW  Comparison: 02/02/2012  Findings: Right mastectomy.  Patient rotated to the right.  Mild hyperinflation.  Mild cardiomegaly.  Right hilar soft tissue fullness is similar over prior exams back to 2005 as detailed on the 02/02/2012 report. The right apical pleural parenchymal opacity is poorly visualized secondary patient obliquity but felt to be similar to on the prior exams.  Mild interstitial thickening at the left lung base, without lobar consolidation  IMPRESSION:  1. No acute cardiopulmonary disease. 2.  Status post right mastectomy. 3.  Chronic right hilar soft tissue fullness, secondary to pulmonary artery enlargement as on the 06/05/2011 CT.   Original Report Authenticated By: Consuello Bossier, M.D.      Medications: I have reviewed the patient's current medications.  Assessment/Plan: MAT/ r/o MI; cardiology consult- Echo- ok Stop diltazem Gtt- Start on Verapamil 120 mg daily Stop BB- due to COPD Low K- replace Abdominal pain- chronic- w/u in past extensive- ; awaiting U/S EUS out pt- Dr Christella Hartigan CT abd- r/o Mesenteric ischemia today- Nausea intermittent D/c celexa Ativan for Anxeity/ add buspar 5 mg bid Chronic pain-  fentany patch- change to 25 mcg Copd- spiriva and alb nebs- cxr ok UTI- rocephin for now- Culture pend FTT- weakness- gait abnormal PT Social work - will benefit from SNF- short term  LOS: 1 day   Esabella Stockinger 03/06/2012, 7:46 AM

## 2012-03-06 NOTE — Progress Notes (Signed)
Admitted pt from ED via stretcher, oriented to room, call bell placed within reach, denies pain this time. Pt on cardizem drip which was started in ED. Admission assessment done, orders carried out. Will monitor. Filed Vitals:   03/06/12 0412  BP: 105/57  Pulse: 61  Temp: 99.6 F (37.6 C)  Resp: 20    Zakiyyah Savannah, [address]

## 2012-03-06 NOTE — H&P (Signed)
PCP:   Georgann Housekeeper, MD   Chief Complaint:  Abdominal pain  HPI: This is a 78 year old female who was brought in for abdominal pain and shortness of breath. Patient and family are poor historians. There is report of shortness of breath last night. There does appear to be some 'chronicness' to this shortness of breath as well as some element of anxiety. The patient denies any wheezing. She did have abdominal pain  s associated with her shortness of breath. Today there was a question of a possible panic attack which led to 911 being called. There is some altered mentation which appears to be intermittent, but has been worse over the past 5 days.   Patient additionally complains of a chronic but now worsening abdominal pain. Per patient pain is epigastric in location then becomes generalized, per  is patient's son pain is right-sided then becomes generalized. There is no report of any vomiting but the patient appears to have intermittent nausea. No diarrhea, no fevers or chills. Patient does drink whiskey once or twice daily. She's been doing this for the past year, per patient this eases  her abdominal discomfort. per family they've been attempting to get her stop her drinking since her last  hospital discharge, her last drink was approximately 5 or 6 days ago. Patient has had workup for this abdominal pain, she had a colonoscopy and EGD. She is on the purple tablet. she's not been worked up for gallbladder disease issues. There is no report of heartburn.   There is no report of chest pain, however, patient was seen by cardiology earlier and chest pain was reported. In the ER the patient was found to be tachycardic with heart rate of 140.  History provided by patient, husband, son were present at bedside.   Review of Systems:  The patient denies anorexia, fever, weight loss, vision loss, decreased hearing, hoarseness, chest pain, syncope, dyspnea on exertion, peripheral edema, balance deficits,  hemoptysis, melena, hematochezia, severe indigestion/heartburn, hematuria, incontinence, genital sores, muscle weakness, suspicious skin lesions, transient blindness, difficulty walking, depression, unusual weight change, abnormal bleeding, enlarged lymph nodes, angioedema, and breast masses.  Past Medical History: Past Medical History  Diagnosis Date  . Coronary artery disease   . Breast CA 1970    rt breast  . Anxiety   . Smoking   . Fibromyalgia   . COPD (chronic obstructive pulmonary disease)   . Right arm cellulitis   . Depression   . Hyperlipidemia   . DJD (degenerative joint disease)   . Hypercalcemia   . Adrenal adenoma     Stable as of 05/2012 CT scan  . Chronic respiratory failure    Past Surgical History  Procedure Date  . Breast surgery   . Abdominal hysterectomy   . Appendectomy   . Mastectomy, radical 1970    Right  . Squamous cell carcinoma excision     Floor of mouth  . Colonoscopy w/ biopsies and polypectomy   . Nasal sinus surgery 1994  . Back surgery     Fusion    Medications: Prior to Admission medications   Medication Sig Start Date End Date Taking? Authorizing Provider  acetaminophen-codeine (TYLENOL #3) 300-30 MG per tablet Take 1 tablet by mouth 2 (two) times daily as needed. For pain    Yes Historical Provider, MD  albuterol (PROVENTIL HFA;VENTOLIN HFA) 108 (90 BASE) MCG/ACT inhaler Inhale 2 puffs into the lungs every 4 (four) hours as needed. Shortness of breath    Yes Historical  Provider, MD  citalopram (CELEXA) 20 MG tablet Take 10 mg by mouth daily.    Yes Historical Provider, MD  dicyclomine (BENTYL) 20 MG tablet Take 1 tablet (20 mg total) by mouth 3 (three) times daily between meals as needed (stomach pain). 02/05/12 02/04/13 Yes Georgann Housekeeper, MD  ergocalciferol (VITAMIN D2) 50000 UNITS capsule Take 50,000 Units by mouth every 14 (fourteen) days.    Yes Historical Provider, MD  fentaNYL (DURAGESIC - DOSED MCG/HR) 12 MCG/HR Place 1 patch (12.5  mcg total) onto the skin every 3 (three) days. 02/05/12 03/06/12 Yes Georgann Housekeeper, MD  fluticasone (FLONASE) 50 MCG/ACT nasal spray Place 2 sprays into the nose daily as needed. For allergies    Yes Historical Provider, MD  furosemide (LASIX) 40 MG tablet Take 1 tablet (40 mg total) by mouth daily. 02/04/12  Yes Georgann Housekeeper, MD  ipratropium-albuterol (DUONEB) 0.5-2.5 (3) MG/3ML SOLN Take 3 mLs by nebulization 3 (three) times daily as needed. Shortness of breath.   Yes Historical Provider, MD  lisinopril (PRINIVIL,ZESTRIL) 5 MG tablet Take 5 mg by mouth daily.   Yes Historical Provider, MD  omeprazole (PRILOSEC) 20 MG capsule Take 20 mg by mouth daily.     Yes Historical Provider, MD  potassium chloride (K-DUR) 10 MEQ tablet Take 1 tablet (10 mEq total) by mouth daily. 02/05/12 02/04/13 Yes Georgann Housekeeper, MD  pregabalin (LYRICA) 75 MG capsule Take 75 mg by mouth 2 (two) times daily.     Yes Historical Provider, MD  tiotropium (SPIRIVA) 18 MCG inhalation capsule Place 18 mcg into inhaler and inhale daily.     Yes Historical Provider, MD  tiZANidine (ZANAFLEX) 4 MG capsule Take 4 mg by mouth at bedtime.     Yes Historical Provider, MD  traMADol (ULTRAM) 50 MG tablet Take 100 mg by mouth every 6 (six) hours as needed. Maximum dose= 8 tablets per day For pain   Yes Historical Provider, MD  triamcinolone cream (KENALOG) 0.1 % Apply 1 application topically 2 (two) times daily as needed. Swelling in right arm.   Yes Historical Provider, MD  trimethoprim (TRIMPEX) 100 MG tablet Take 100 mg by mouth daily.   Yes Historical Provider, MD    Allergies:  No Known Allergies  Social History:  reports that she has been smoking.  She has never used smokeless tobacco. She reports that she does not drink alcohol or use illicit drugs. lives at home, use a walker and a cane. A fall risk. On home oxygen. As her husband and son  Family History: Family History  Problem Relation Age of Onset  . Colon cancer Brother     . Heart disease Father     Physical Exam: Filed Vitals:   03/05/12 1804 03/05/12 1934 03/05/12 2300 03/05/12 2345  BP: 136/74 120/67 110/50 136/88  Pulse: 90 72 62 64  Temp:      TempSrc:      Resp:   28 15  SpO2: 97% 98% 100% 99%    General:  Alert and oriented times three, well developed and nourished, no acute distress Eyes: PERRLA, pink conjunctiva, no scleral icterus ENT: Moist oral mucosa, neck supple, no thyromegaly Lungs: clear to ascultation, no wheeze, no crackles, no use of accessory muscles Cardiovascular: regular rate and rhythm, no regurgitation, no gallops, no murmurs. No carotid bruits, no JVD Abdomen: soft, positive BS, nonspecific generalized tenderness to palpation, non-distended, no organomegaly, not an acute abdomen GU: not examined Neuro: CN II - XII grossly intact, sensation  intact Musculoskeletal: strength 5/5 all extremities, no clubbing, cyanosis or edema Skin: no rash, no subcutaneous crepitation, no decubitus   Labs on Admission:   [facility] 03/05/12 2054 03/05/12 1540  NA 137 138  K 3.6 3.1*  CL 95* 92*  CO2 25 23  GLUCOSE 112* 99  BUN 16 16  CREATININE 0.91 0.88  CALCIUM 8.9 9.1  MG -- --  PHOS -- --    Basename 03/05/12 2054 03/05/12 1540  AST 18 23  ALT 7 8  ALKPHOS 71 69  BILITOT 0.5 0.5  PROT 6.5 6.9  ALBUMIN 3.3* 3.6   No results found for this basename: LIPASE:2,AMYLASE:2 in the last 72 hours  Basename 03/05/12 2054 03/05/12 1540  WBC 10.0 9.6  NEUTROABS 8.4* 8.2*  HGB 12.0 12.4  HCT 36.9 37.7  MCV 101.7* 100.8*  PLT 203 193    Basename 03/05/12 2228  CKTOTAL --  CKMB --  CKMBINDEX --  TROPONINI <0.30   No components found with this basename: POCBNP:3 No results found for this basename: DDIMER:2 in the last 72 hours No results found for this basename: HGBA1C:2 in the last 72 hours No results found for this basename: CHOL:2,HDL:2,LDLCALC:2,TRIG:2,CHOLHDL:2,LDLDIRECT:2 in the last 72 hours No results found for  this basename: TSH,T4TOTAL,FREET3,T3FREE,THYROIDAB in the last 72 hours No results found for this basename: VITAMINB12:2,FOLATE:2,FERRITIN:2,TIBC:2,IRON:2,RETICCTPCT:2 in the last 72 hours  Micro Results: No results found for this or any previous visit (from the past 240 hour(s)). Results for IZZABELLE, BOULEY (MRN [id number]) as of 03/06/2012 01:48  Ref. Range 03/05/2012 17:49  Color, Urine Latest Range: YELLOW  YELLOW  APPearance Latest Range: CLEAR  CLOUDY (A)  Specific Gravity, Urine Latest Range: 1.005-1.030  1.010  pH Latest Range: 5.0-8.0  6.5  Glucose Latest Range: NEGATIVE mg/dL NEGATIVE  Bilirubin Urine Latest Range: NEGATIVE  NEGATIVE  Ketones, ur Latest Range: NEGATIVE mg/dL 15 (A)  Protein Latest Range: NEGATIVE mg/dL NEGATIVE  Urobilinogen, UA Latest Range: 0.0-1.0 mg/dL 0.2  Nitrite Latest Range: NEGATIVE  NEGATIVE  Leukocytes, UA Latest Range: NEGATIVE  LARGE (A)  Hgb urine dipstick Latest Range: NEGATIVE  TRACE (A)  WBC, UA Latest Range: <3 WBC/hpf 11-20  RBC / HPF Latest Range: <3 RBC/hpf 0-2  Squamous Epithelial / LPF Latest Range: RARE  MANY (A)  Bacteria, UA Latest Range: RARE  FEW (A)    Radiological Exams on Admission: Dg Chest Port 1 View  03/05/2012  *RADIOLOGY REPORT*  Clinical Data: Altered mental status.  PORTABLE CHEST - 1 VIEW  Comparison: 02/02/2012  Findings: Right mastectomy.  Patient rotated to the right.  Mild hyperinflation.  Mild cardiomegaly.  Right hilar soft tissue fullness is similar over prior exams back to 2005 as detailed on the 02/02/2012 report. The right apical pleural parenchymal opacity is poorly visualized secondary patient obliquity but felt to be similar to on the prior exams.  Mild interstitial thickening at the left lung base, without lobar consolidation  IMPRESSION:  1. No acute cardiopulmonary disease. 2.  Status post right mastectomy. 3.  Chronic right hilar soft tissue fullness, secondary to pulmonary artery enlargement as on the  06/05/2011 CT.   Original Report Authenticated By: Consuello Bossier, M.D.     EKG: Normal sinus rhythm with diffusely flipped T waves  Assessment/Plan Present on Admission:  .Multifocal atrial tachycardia heart rate of 140 Admit to telemetry Patient seen by cardiology, will followup with patient in a.m. MAT, started on a Cardizem drip which is being weaned.  Lisinopril is held,  low dose metoprolol started  Cycle cardiac enzymes  .Abdominal pain Chronic for the past 2 years  Evaluate for mesenteric ischemia, CT angio of mesenteric vessels  GI cocktail ordered, continue Protonix  Patient been worked up for gallbladder disease.she's already had a colonoscopy and EGD this year. She is seen by [facility] .Shortness of breath .COPD (chronic obstructive pulmonary disease) Chronic respiratory failure Tobacco abuse Continue oxygen and nicotine patch  Suspect an element of anxiety  Possible alcohol abuse Per family patient has been drinking whiskey for approximately the past year. Patient states it helps her abdominal discomfort. She has not drank in approximately 6 day. CIWA protocol ordered  Encephalopathy ? Underlying mild dementia Urinary tract infection Altered mentation may be multifactorial. Question some degree of alcohol withdrawal plus infection. Also question underlying mild dementia.  Rocephin started. Continue to monitor  Anxiety Fibromyalgia History of breast cancer  general debility Stable   Full code DVT prophylaxis    Edward Trevino 03/06/2012, 1:47 AM

## 2012-03-06 NOTE — Progress Notes (Signed)
Pt's potassium is 3.0, [facility] hospitalist on call notified, awaiting order at this time.

## 2012-03-06 NOTE — Progress Notes (Signed)
PT Cancellation Note  Treatment cancelled today due to patient's refusal to participate.  Pt requests PT returning tomorrow.  Will follow-up as able.  Cephus Shelling 03/06/2012, 12:04 PM  03/06/2012 Cephus Shelling, PT, DPT [phone]

## 2012-03-06 NOTE — Clinical Social Work Psychosocial (Signed)
Clinical Social Work Department BRIEF PSYCHOSOCIAL ASSESSMENT 03/06/2012  Patient:  Theresa Blackwell, Theresa Blackwell     Account Number:  [id number]     Admit date:  03/05/2012  Clinical Social Worker:  Juliette Mangle  Date/Time:  03/06/2012 01:43 PM  Referred by:  Physician  Date Referred:  03/06/2012 Referred for  SNF Placement   Other Referral:   Interview type:  Patient Other interview type:   Patient's grandson Theresa Blackwell was present- Theresa Blackwell is the primary caregiver for the patient    PSYCHOSOCIAL DATA Living Status:  FAMILY Admitted from facility:   Level of care:   Primary support name:  Theresa Blackwell Primary support relationship to patient:  FAMILY Degree of support available:   Strong and vested    CURRENT CONCERNS Current Concerns  Post-Acute Placement   Other Concerns:    SOCIAL WORK ASSESSMENT / PLAN Clinical Social Worker received referral for the potential need for post acute placement.  CSW reviewed chart and met with patient and patient's grandson at bedside. CSW introduced self, explained role, and provided emotional support. CSW discussed skilled nursing home placement and reviewed the process of placement and answered all the patient's questions. Patient wsa somehwat confused but was agreeable to CSW seeking placement. Patient's grandson also was agreeable to placement.  CSW encouraged patient to  ask questions as needed of CSW and  medical staff.  CSW will begin the placement process and will continue to follow and assist with all d/c planning.   Assessment/plan status:  Psychosocial Support/Ongoing Assessment of Needs Other assessment/ plan:   Information/referral to community resources:   CSW provicded family with SNF choice list    PATIENT'S/FAMILY'S RESPONSE TO PLAN OF CARE: Patient and patient's grandson were very appreciative of support and information provided by CSW.    Sabino Niemann, MSW, [employer] [phone]

## 2012-03-06 NOTE — Progress Notes (Signed)
Dr. Donette Larry called to check pts current HR.  Notified MD that HR has been in the 70's-80's, and that pt has been very sleepy all afternoon.  Notified MD that family informed me of the pt not sleeping at all the past 3 days due to abdomen pain at home.  Will continue to monitor pt.

## 2012-03-06 NOTE — Progress Notes (Signed)
UR COMPLETED  

## 2012-03-06 NOTE — Progress Notes (Signed)
Pts HR is going up to 140's-150's nonsustained.  Pt is asymptomatic, BP is 106/62 and HR is 109.  Dr. Donette Larry notified and ordered a dose of 120mg  verapamil to be given now.  Will carry out MD orders and continue to monitor pt.

## 2012-03-06 NOTE — Progress Notes (Signed)
Patient: Theresa Blackwell Date of Encounter: 03/06/2012, 10:57 AM Admit date: 03/05/2012     Subjective  Theresa Blackwell denies any recurrent left shoulder pain. She states her breathing seems better this AM.   Objective  Physical Exam: Vitals: BP 104/62  Pulse 61  Temp 99.6 F (37.6 C) (Oral)  Resp 20  Ht 5\' 5"  (1.651 m)  Wt 148 lb 2.4 oz (67.2 kg)  BMI 24.65 kg/m2  SpO2 97% General: Well developed 78 year old female in no acute distress. Neck: Supple. JVD not elevated. Lungs: Diminished breath sounds throughout but clear bilaterally to auscultation without wheezes, rales, or rhonchi. Breathing is unlabored. Heart: RRR S1 S2 without murmurs, rub or gallop.  Abdomen: Soft, non-distended. Extremities: No clubbing or cyanosis. No edema.  Distal pedal pulses are 2+ and equal bilaterally. Neuro: Alert and oriented X 3. Moves all extremities spontaneously. No focal deficits.  Intake/Output: Intake/Output Summary (Last 24 hours) at 03/06/12 1057 Last data filed at 03/06/12 1045  Gross per 24 hour  Intake 386.33 ml  Output      0 ml  Net 386.33 ml   Inpatient Medications:  . busPIRone  5 mg Oral BID  . cefTRIAXone (ROCEPHIN)  IV  1 g Intravenous Once  . cefTRIAXone (ROCEPHIN)  IV  1 g Intravenous Q24H  . enoxaparin (LOVENOX) injection  40 mg Subcutaneous Q24H  . feeding supplement  237 mL Oral BID BM  . fentaNYL  25 mcg Transdermal Q72H  . fentaNYL  50 mcg Intravenous Once  . folic acid  1 mg Oral Daily  . gi cocktail  30 mL Oral Once  . influenza  inactive virus vaccine  0.5 mL Intramuscular Tomorrow-1000  . LORazepam  0-4 mg Oral Q6H   Followed by  . LORazepam  0-4 mg Oral Q12H  . magnesium sulfate 1 - 4 g bolus IVPB  2 g Intravenous Once  . multivitamin with minerals  1 tablet Oral Daily  . nicotine  14 mg Transdermal Daily  . ondansetron (ZOFRAN) IV  4 mg Intravenous Once  . pantoprazole  40 mg Oral Q1200  . potassium chloride  10 mEq Oral Daily  . potassium  chloride  40 mEq Oral Once  . pregabalin  75 mg Oral BID  . sodium chloride  3 mL Intravenous Q12H  . thiamine  100 mg Oral Daily   Or  . thiamine  100 mg Intravenous Daily  . tiotropium  18 mcg Inhalation Daily  . tiZANidine  4 mg Oral QHS  . verapamil  120 mg Oral Daily   Labs:  [facility] 03/06/12 0425 03/05/12 2054  NA 138 137  K 3.0* 3.6  CL 96 95*  CO2 30 25  GLUCOSE 83 112*  BUN 16 16  CREATININE 0.96 0.91  CALCIUM 8.6 8.9  MG -- --  PHOS -- --    Basename 03/05/12 2054 03/05/12 1540  AST 18 23  ALT 7 8  ALKPHOS 71 69  BILITOT 0.5 0.5  PROT 6.5 6.9  ALBUMIN 3.3* 3.6    Basename 03/05/12 1530  LIPASE 54  AMYLASE --    Basename 03/06/12 0425 03/05/12 2054 03/05/12 1540  WBC 10.1 10.0 --  NEUTROABS -- 8.4* 8.2*  HGB 11.1* 12.0 --  HCT 34.4* 36.9 --  MCV 100.6* 101.7* --  PLT 182 203 --    Basename 03/06/12 0425 03/05/12 2228  CKTOTAL -- --  CKMB -- --  TROPONINI <0.30 <0.30  No components found with this basename: POCBNP:3 No results found for this basename: DDIMER in the last 72 hours No results found for this basename: TSH,T4TOTAL,FREET3,T3FREE,THYROIDAB in the last 72 hours   Radiology/Studies: Dg Chest Port 1 View  03/05/2012  *RADIOLOGY REPORT*  Clinical Data: Altered mental status.  PORTABLE CHEST - 1 VIEW  Comparison: 02/02/2012  Findings: Right mastectomy.  Patient rotated to the right.  Mild hyperinflation.  Mild cardiomegaly.  Right hilar soft tissue fullness is similar over prior exams back to 2005 as detailed on the 02/02/2012 report. The right apical pleural parenchymal opacity is poorly visualized secondary patient obliquity but felt to be similar to on the prior exams.  Mild interstitial thickening at the left lung base, without lobar consolidation  IMPRESSION:  1. No acute cardiopulmonary disease. 2.  Status post right mastectomy. 3.  Chronic right hilar soft tissue fullness, secondary to pulmonary artery enlargement as on the 06/05/2011  CT.   Original Report Authenticated By: Consuello Bossier, M.D.    Ct Angio Abd/pel W/ And/or W/o  03/06/2012  *RADIOLOGY REPORT*  Clinical Data:  Chronic abdominal pain, nausea  CT ANGIOGRAPHY ABDOMEN AND PELVIS  Technique:  Multidetector CT imaging of the abdomen and pelvis was performed using the standard protocol during bolus administration of intravenous contrast.  Multiplanar reconstructed images including MIPs were obtained and reviewed to evaluate the vascular anatomy.  Contrast: OMNIPAQUE IOHEXOL 350 MG/ML SOLN, 60mL OMNIPAQUE IOHEXOL 350 MG/ML SOLN  Comparison:  MR 02/03/2012  Arterial findings: Aorta:                  Ectatic with  moderate scattered calcified plaque.  There is     eccentric mural thrombus in the infrarenal segment.  No dissection, aneurysm, or stenosis.  Celiac axis:            Widely patent.  Superior mesenteric:Widely patent, with classic distal branching anatomy.  Left renal:             Duplicated, inferior moiety dominant, both widely patent.  Right renal:            Single, with partially calcified ostial nonocclusive plaque, widely patent distally.  There is a subtle beading in the distal right main renal artery without definite intraluminal webs, aneurysm, or stenosis.  Inferior mesenteric:Widely patent  Left iliac:             Mildly tortuous, with minimal nonocclusive plaque.  No dissection, aneurysm, or stenosis.  Right iliac:            Mildly tortuous with minimal plaque, no dissection, aneurysm, or stenosis.  Venous findings:  Patent hepatic veins, portal vein, superior mesenteric vein, splenic vein, bilateral renal veins, IVC, and iliac venous system.   Review of the MIP images confirms the above findings.  Nonvascular findings: Visualized lung bases clear. Aberrant position of the gallbladder anterior to the right hepatic lobe. Unremarkable liver, spleen, pancreas, kidneys.  There is bilateral bilateral adrenal hypertrophy, right greater than left.  Common bile duct  is prominent, measuring up to 10 mm diameter, seen down to the level of the ampulla.  There is also pancreatic duct dilatation to 3 mm through the head and body, also seen down to the level of the duodenum.  Stomach and small bowel are nondilated. Lipomatous ileocecal valve.  Appendix not discretely identified. The colon is nondilated with a few scattered sigmoid diverticula. No ascites.  No free air.  No mesenteric, retroperitoneal, or pelvic  adenopathy.  Urinary bladder is incompletely distended. Bilateral pelvic phleboliths.  Previous hysterectomy.  Degenerative and postoperative changes in the lumbar spine.  IMPRESSION:  1.  No significant proximal visceral arterial occlusive disease to suggest an etiology of occlusive mesenteric ischemia. 2.  Possible fibromuscular dysplasia (FMD) in the distal right renal artery.  Correlate with any clinical or laboratory evidence of renovascular hypertension or renal dysfunction. 3.  Extrahepatic biliary ductal dilatation and pancreatic ductal dilatation as previously described.   Original Report Authenticated By: Osa Craver, M.D.     Echocardiogram: - Left ventricle: The cavity size was normal. Wall thickness was normal. The estimated ejection fraction was 55%. Wall motion was normal; there were no regional wall motion abnormalities. The study is not technically sufficient to allow evaluation of LV diastolic function due to freqeunt PACs. - Aortic valve: Mild regurgitation. - Mitral valve: Calcified annulus.  Telemetry: sinus rhythm currently; intermittent atrial tachycardia, MAT   Assessment and Plan  1. Left shoulder pain - CEs negative; echo shows normal LV function without WMAs; consider stress testing for cardiac risk stratification 2. PAFib - documented MAT while here; now on verapamil 3. Abdominal pain - per primary medical team and GI 4. Chronic respiratory failure/COPD  Dr. Johney Frame to see and make further  recommendations. Signed, EDMISTEN, BROOKE PA-C    I have seen, examined the patient, and reviewed the above assessment and plan.  Changes to above are made where necessary.  Pt is presently resting but rouses. She reports significant abdominal pain but denies CP.  ON exam, she has abdominal distension with pain and hyperdynamic bowel sounds.  No rebound/ guarding. Tele reveals predominantly sinus with PACs and episodes of MAT.  Afib is also observed but short in duration.  At this point, I think that her abdominal pain is the primary clinical concern.  Ongoing workup as per the primary team. From a CV standpoint, continue current verapamil dosing and follow tachycardia.  I dont think that we need to be very aggressive with rate control given her preserved EF and paucity of symptoms. She reports no chest pain and CMs are negative.  She did have significant ST segments changes on ekg which are new.  I think that once her abdominal pain is resolved that lexiscan myoview would be reasonable.  We will follow with you.  Co Sign: Hillis Range, MD 03/06/2012 4:19 PM

## 2012-03-07 ENCOUNTER — Other Ambulatory Visit: Payer: Self-pay

## 2012-03-07 LAB — BASIC METABOLIC PANEL
CO2: 32 mEq/L (ref 19–32)
Calcium: 8.9 mg/dL (ref 8.4–10.5)
GFR calc non Af Amer: 50 mL/min — ABNORMAL LOW (ref >90)
Potassium: 4.1 mEq/L (ref 3.5–5.1)
Sodium: 140 mEq/L (ref 135–145)

## 2012-03-07 MED ORDER — DOCUSATE SODIUM 100 MG PO CAPS
100.0000 mg | ORAL_CAPSULE | Freq: Two times a day (BID) | ORAL | Status: DC
  Administered 2012-03-07 – 2012-03-10 (×7): 100 mg via ORAL
  Filled 2012-03-07 (×9): qty 1

## 2012-03-07 NOTE — Progress Notes (Signed)
1330 HR . 130' s Denied palpitation, sob  No chest pain . Cardiac monitor is afib. Referred pt to Dr. Marikay Alar

## 2012-03-07 NOTE — Progress Notes (Signed)
1430 ekg results relayed to [place] ,[place]  ,m Afibrillation . Will see the pt

## 2012-03-07 NOTE — Progress Notes (Signed)
Subjective: Pt still with some pain HR in 60;s overnight Weakness Gait unsteady  Objective: Vital signs in last 24 hours: Temp:  [97 F (36.1 C)-98.4 F (36.9 C)] 97.9 F (36.6 C) (09/13 0625) Pulse Rate:  [65-109] 65  (09/13 0625) Resp:  [18-20] 18  (09/13 0625) BP: (95-109)/(48-62) 100/50 mmHg (09/13 0625) SpO2:  [97 %-100 %] 97 % (09/13 0625) Weight:  [67.541 kg (148 lb 14.4 oz)] 67.541 kg (148 lb 14.4 oz) (09/13 0625) Weight change: 0.34 kg (12 oz) Last BM Date: 03/05/12  Intake/Output from previous day: 09/12 0701 - 09/13 0700 In: 612 [P.O.:360; I.V.:252] Out: 600 [Urine:600] Intake/Output this shift: Total I/O In: -  Out: 150 [Urine:150]  General appearance: alert Resp: clear to auscultation bilaterally Cardio: regular rate and rhythm GI: soft, non-tender; bowel sounds normal; no masses,  no organomegaly  Lab Results:  [facility] 03/06/12 0425 03/05/12 2054  WBC 10.1 10.0  HGB 11.1* 12.0  HCT 34.4* 36.9  PLT 182 203   BMET  Basename 03/07/12 0455 03/06/12 0425  NA 140 138  K 4.1 3.0*  CL 100 96  CO2 32 30  GLUCOSE 124* 83  BUN 20 16  CREATININE 1.04 0.96  CALCIUM 8.9 8.6    Studies/Results: Dg Chest Port 1 View  03/05/2012  *RADIOLOGY REPORT*  Clinical Data: Altered mental status.  PORTABLE CHEST - 1 VIEW  Comparison: 02/02/2012  Findings: Right mastectomy.  Patient rotated to the right.  Mild hyperinflation.  Mild cardiomegaly.  Right hilar soft tissue fullness is similar over prior exams back to 2005 as detailed on the 02/02/2012 report. The right apical pleural parenchymal opacity is poorly visualized secondary patient obliquity but felt to be similar to on the prior exams.  Mild interstitial thickening at the left lung base, without lobar consolidation  IMPRESSION:  1. No acute cardiopulmonary disease. 2.  Status post right mastectomy. 3.  Chronic right hilar soft tissue fullness, secondary to pulmonary artery enlargement as on the 06/05/2011 CT.    Original Report Authenticated By: Consuello Bossier, M.D.    Ct Angio Abd/pel W/ And/or W/o  03/06/2012  *RADIOLOGY REPORT*  Clinical Data:  Chronic abdominal pain, nausea  CT ANGIOGRAPHY ABDOMEN AND PELVIS  Technique:  Multidetector CT imaging of the abdomen and pelvis was performed using the standard protocol during bolus administration of intravenous contrast.  Multiplanar reconstructed images including MIPs were obtained and reviewed to evaluate the vascular anatomy.  Contrast: OMNIPAQUE IOHEXOL 350 MG/ML SOLN, 60mL OMNIPAQUE IOHEXOL 350 MG/ML SOLN  Comparison:  MR 02/03/2012  Arterial findings: Aorta:                  Ectatic with  moderate scattered calcified plaque.  There is     eccentric mural thrombus in the infrarenal segment.  No dissection, aneurysm, or stenosis.  Celiac axis:            Widely patent.  Superior mesenteric:Widely patent, with classic distal branching anatomy.  Left renal:             Duplicated, inferior moiety dominant, both widely patent.  Right renal:            Single, with partially calcified ostial nonocclusive plaque, widely patent distally.  There is a subtle beading in the distal right main renal artery without definite intraluminal webs, aneurysm, or stenosis.  Inferior mesenteric:Widely patent  Left iliac:             Mildly tortuous, with minimal  nonocclusive plaque.  No dissection, aneurysm, or stenosis.  Right iliac:            Mildly tortuous with minimal plaque, no dissection, aneurysm, or stenosis.  Venous findings:  Patent hepatic veins, portal vein, superior mesenteric vein, splenic vein, bilateral renal veins, IVC, and iliac venous system.   Review of the MIP images confirms the above findings.  Nonvascular findings: Visualized lung bases clear. Aberrant position of the gallbladder anterior to the right hepatic lobe. Unremarkable liver, spleen, pancreas, kidneys.  There is bilateral bilateral adrenal hypertrophy, right greater than left.  Common bile duct is  prominent, measuring up to 10 mm diameter, seen down to the level of the ampulla.  There is also pancreatic duct dilatation to 3 mm through the head and body, also seen down to the level of the duodenum.  Stomach and small bowel are nondilated. Lipomatous ileocecal valve.  Appendix not discretely identified. The colon is nondilated with a few scattered sigmoid diverticula. No ascites.  No free air.  No mesenteric, retroperitoneal, or pelvic adenopathy.  Urinary bladder is incompletely distended. Bilateral pelvic phleboliths.  Previous hysterectomy.  Degenerative and postoperative changes in the lumbar spine.  IMPRESSION:  1.  No significant proximal visceral arterial occlusive disease to suggest an etiology of occlusive mesenteric ischemia. 2.  Possible fibromuscular dysplasia (FMD) in the distal right renal artery.  Correlate with any clinical or laboratory evidence of renovascular hypertension or renal dysfunction. 3.  Extrahepatic biliary ductal dilatation and pancreatic ductal dilatation as previously described.   Original Report Authenticated By: Osa Craver, M.D.     Medications: I have reviewed the patient's current medications.  Assessment/Plan: MAT/ continue on verapamil - HR in 70's- CM's negative; Echo EF -60% BP low side- will watch UTI- continue Rocephin for 2 days- until 03/09/12 Abdominal pain-  Non specific BD dilation, CT abd negative for  Mesenteric ischemia-  Out pt f/u with Dr Gerilyn Pilgrim Chronic pain syndrome continue fentanyl patch 25 mcg Avoid other narcotic Anxiety chronic-  Buspar and ativan Constipation-colace COPD- continue/ spir iva alb neb Low K - resolved d/c K Weakness- FTT PT / SNF rehab  Monday     LOS: 2 days   Bladimir Auman 03/07/2012, 7:50 AM

## 2012-03-07 NOTE — Evaluation (Signed)
Physical Therapy Evaluation Patient Details Name: Theresa Blackwell MRN: [id number] DOB: [birth year] Today's Date: 03/07/2012 Time: 4098-1191 PT Time Calculation (min): 27 min  PT Assessment / Plan / Recommendation Clinical Impression  Patient is a 78 yo female admitted with abdominal pain.  Patient also having tachycardia.  Overall very weak with decreased mobility.  Patient will benefit from acute PT for strengthening, mobility training, and education.  Recommend ST-SNF at discharge for continued therapy.    PT Assessment  Patient needs continued PT services    Follow Up Recommendations  Skilled nursing facility    Barriers to Discharge        Equipment Recommendations  Defer to next venue    Recommendations for Other Services     Frequency Min 3X/week    Precautions / Restrictions Precautions Precautions: Fall Restrictions Weight Bearing Restrictions: No         Mobility  Bed Mobility Bed Mobility: Supine to Sit;Sitting - Scoot to Edge of Bed;Sit to Supine Supine to Sit: 4: Min guard;With rails;HOB flat Sitting - Scoot to Edge of Bed: 4: Min guard;With rail Sit to Supine: 4: Min assist;With rail;HOB flat Details for Bed Mobility Assistance: Assist to bring LE's onto bed when returning to supine.  Verbal cues for technique. Transfers Transfers: Sit to Stand;Stand to Sit Sit to Stand: 4: Min assist;With upper extremity assist;From bed Stand to Sit: 4: Min assist;With upper extremity assist;To bed Details for Transfer Assistance: Verbal cues for hand placement.  Patient stood approximately 30 seconds and returned to sitting.  Patient reports feeling weak and shaky. Ambulation/Gait Ambulation/Gait Assistance: Not tested (comment)    Exercises     PT Diagnosis: Difficulty walking;Generalized weakness;Acute pain;Altered mental status  PT Problem List: Decreased strength;Decreased activity tolerance;Decreased balance;Decreased mobility;Decreased cognition;Cardiopulmonary  status limiting activity;Pain PT Treatment Interventions: DME instruction;Gait training;Functional mobility training;Therapeutic exercise;Cognitive remediation;Patient/family education   PT Goals Acute Rehab PT Goals PT Goal Formulation: With patient/family Time For Goal Achievement: 03/21/12 Potential to Achieve Goals: Good Pt will go Sit to Stand: with modified independence;with upper extremity assist PT Goal: Sit to Stand - Progress: Goal set today Pt will go Stand to Sit: with modified independence;with upper extremity assist PT Goal: Stand to Sit - Progress: Goal set today Pt will Transfer Bed to Chair/Chair to Bed: with supervision PT Transfer Goal: Bed to Chair/Chair to Bed - Progress: Goal set today Pt will Ambulate: 51 - 150 feet;with supervision;with rolling walker PT Goal: Ambulate - Progress: Goal set today  Visit Information  Last PT Received On: 03/07/12 Assistance Needed: +1    Subjective Data  Subjective: "I feel weak" Patient Stated Goal: To get stronger   Prior Functioning  Home Living Lives With: Significant other Lucila [place]) Available Help at Discharge: Skilled Nursing Facility Home Adaptive Equipment: Walker - standard;Straight cane (Home O2) Prior Function Level of Independence: Independent with assistive device(s);Needs assistance Needs Assistance: Bathing;Meal Prep;Light Housekeeping Bath: Minimal Meal Prep: Total Light Housekeeping: Total Able to Take Stairs?: No Driving: No Vocation: Retired Musician: No difficulties    Cognition  Overall Cognitive Status: Impaired Area of Impairment: Memory;Safety/judgement;Awareness of deficits Arousal/Alertness: Awake/alert Orientation Level: Disoriented to;Place;Time;Situation Behavior During Session: WFL for tasks performed Memory Deficits: Difficulty giving information regarding living situation Safety/Judgement: Decreased awareness of need for assistance    Extremity/Trunk  Assessment Right Lower Extremity Assessment RLE ROM/Strength/Tone: Deficits RLE ROM/Strength/Tone Deficits: General weakness 4-/5 RLE Sensation: WFL - Light Touch Left Lower Extremity Assessment LLE ROM/Strength/Tone: Deficits LLE ROM/Strength/Tone Deficits: General weakness 4-/5  LLE Sensation: WFL - Light Touch   Balance    End of Session PT - End of Session Equipment Utilized During Treatment: Gait belt;Oxygen Activity Tolerance: Patient limited by fatigue Patient left: in bed;with call bell/phone within reach;with family/visitor present Nurse Communication: Mobility status  GP     Vena Austria 03/07/2012, 4:33 PM Durenda Hurt. Renaldo Fiddler, [facility] Acute Rehab Services Pager [phone]

## 2012-03-08 MED ORDER — CIPROFLOXACIN HCL 250 MG PO TABS
250.0000 mg | ORAL_TABLET | Freq: Two times a day (BID) | ORAL | Status: AC
  Administered 2012-03-08 – 2012-03-09 (×4): 250 mg via ORAL
  Filled 2012-03-08 (×4): qty 1

## 2012-03-08 NOTE — ED Provider Notes (Signed)
Medical screening examination/treatment/procedure(s) were performed by non-physician practitioner and as supervising physician I was immediately available for consultation/collaboration.  Jones Skene, M.D.     Jones Skene, MD 03/08/12 1559

## 2012-03-08 NOTE — Progress Notes (Signed)
Patient ID: Theresa Blackwell, female   DOB: Apr 30, 1931, 78 y.o.   MRN: [id number] Chart reviewed. In normal sinus rhythm. Nothing new to add. See again on Monday if still in patient. See Dr. Jenel Lucks recommendations.

## 2012-03-08 NOTE — Plan of Care (Signed)
Problem: Phase I Progression Outcomes Goal: Heart rate or rhythm control medication Outcome: Completed/Met Date Met:  03/08/12 Pt oob in chair, pt HR has been below 120, pt did have c/o chest pain earlier vss, Dr.Polite aware, pt has no other symptoms, has not c/o any further pain, will continue to monitor Worthy Flank, RN

## 2012-03-08 NOTE — Progress Notes (Signed)
Physical Therapy Treatment Patient Details Name: Theresa Blackwell MRN: [id number] DOB: 11-Dec-1930 Today's Date: 03/08/2012 Time: 7829-5621 PT Time Calculation (min): 29 min  PT Assessment / Plan / Recommendation Comments on Treatment Session  Patient with improved gait today - less shaky and able to take steps with RW.    Follow Up Recommendations  Skilled nursing facility    Barriers to Discharge        Equipment Recommendations  Defer to next venue    Recommendations for Other Services    Frequency Min 3X/week   Plan Discharge plan remains appropriate;Frequency remains appropriate    Precautions / Restrictions Precautions Precautions: Fall Restrictions Weight Bearing Restrictions: No       Mobility  Bed Mobility Bed Mobility: Not assessed Transfers Transfers: Sit to Stand;Stand to Sit Sit to Stand: 4: Min assist;With upper extremity assist;With armrests;From chair/3-in-1 Stand to Sit: 4: Min assist;With upper extremity assist;With armrests;To chair/3-in-1 Details for Transfer Assistance: Verbal cues for hand placement.  Assist and verbal cues for safety when returning to sitting. Ambulation/Gait Ambulation/Gait Assistance: 4: Min assist Ambulation Distance (Feet): 36 Feet Assistive device: Rolling walker Ambulation/Gait Assistance Details: Verbal cues to stand upright during gait.  Verbal cues and physical assist to safely use RW.  Cues to keep RW closer to body.  Assist with RW during turns.  Patient with unsteady gait with RW. Gait Pattern: Step-through pattern;Decreased stride length;Trunk flexed Gait velocity: Slow gait speed      PT Goals Acute Rehab PT Goals PT Goal: Sit to Stand - Progress: Progressing toward goal PT Goal: Stand to Sit - Progress: Progressing toward goal PT Transfer Goal: Bed to Chair/Chair to Bed - Progress: Progressing toward goal PT Goal: Ambulate - Progress: Progressing toward goal  Visit Information  Last PT Received On:  03/08/12 Assistance Needed: +1    Subjective Data  Subjective: "I feel better today"   Cognition  Overall Cognitive Status: Impaired Area of Impairment: Memory;Safety/judgement Arousal/Alertness: Awake/alert Orientation Level: Disoriented to;Time;Situation Behavior During Session: WFL for tasks performed Safety/Judgement: Decreased awareness of need for assistance    Balance     End of Session PT - End of Session Equipment Utilized During Treatment: Gait belt;Oxygen Activity Tolerance: Patient limited by fatigue Patient left: in chair;with call bell/phone within reach Nurse Communication: Mobility status   GP     Vena Austria 03/08/2012, 11:38 AM Durenda Hurt. Renaldo Fiddler, [facility] Acute Rehab Services Pager [phone]

## 2012-03-08 NOTE — Progress Notes (Signed)
Pt had run of SVT, pt asymptomatic, MD aware, will continue to monitor .

## 2012-03-08 NOTE — Progress Notes (Signed)
Subjective: Patient is pleasant , no apparent distress.  There was some concern for atrial fibrillation yesterday, currently patient in sinus rhythm, she has a known history of MAT. She's followed by cardiology.  Objective: Vital signs in last 24 hours: Temp:  [97.8 F (36.6 C)-98.3 F (36.8 C)] 98.1 F (36.7 C) (09/14 0446) Pulse Rate:  [66-140] 76  (09/14 0446) Resp:  [18-20] 18  (09/14 0446) BP: (78-131)/(47-111) 104/59 mmHg (09/14 0446) SpO2:  [94 %-99 %] 97 % (09/14 0834) Weight:  [70 kg (154 lb 5.2 oz)] 70 kg (154 lb 5.2 oz) (09/14 0446) Weight change: 2.459 kg (5 lb 6.8 oz) Last BM Date: 03/05/12  Intake/Output from previous day: 09/13 0701 - 09/14 0700 In: 1760 [P.O.:1760] Out: 550 [Urine:550] Intake/Output this shift: Total I/O In: 120 [P.O.:120] Out: -   General appearance: Slightly anxious Resp: clear to auscultation bilaterally Cardio: regular rate and rhythm, S1, S2 normal, no murmur, click, rub or gallop Extremities: extremities normal, atraumatic, no cyanosis or edema  Lab Results:  No results found for this or any previous visit (from the past 24 hour(s)).    Studies/Results: Ct Angio Abd/pel W/ And/or W/o  03/06/2012  *RADIOLOGY REPORT*  Clinical Data:  Chronic abdominal pain, nausea  CT ANGIOGRAPHY ABDOMEN AND PELVIS  Technique:  Multidetector CT imaging of the abdomen and pelvis was performed using the standard protocol during bolus administration of intravenous contrast.  Multiplanar reconstructed images including MIPs were obtained and reviewed to evaluate the vascular anatomy.  Contrast: OMNIPAQUE IOHEXOL 350 MG/ML SOLN, 60mL OMNIPAQUE IOHEXOL 350 MG/ML SOLN  Comparison:  MR 02/03/2012  Arterial findings: Aorta:                  Ectatic with  moderate scattered calcified plaque.  There is     eccentric mural thrombus in the infrarenal segment.  No dissection, aneurysm, or stenosis.  Celiac axis:            Widely patent.  Superior mesenteric:Widely  patent, with classic distal branching anatomy.  Left renal:             Duplicated, inferior moiety dominant, both widely patent.  Right renal:            Single, with partially calcified ostial nonocclusive plaque, widely patent distally.  There is a subtle beading in the distal right main renal artery without definite intraluminal webs, aneurysm, or stenosis.  Inferior mesenteric:Widely patent  Left iliac:             Mildly tortuous, with minimal nonocclusive plaque.  No dissection, aneurysm, or stenosis.  Right iliac:            Mildly tortuous with minimal plaque, no dissection, aneurysm, or stenosis.  Venous findings:  Patent hepatic veins, portal vein, superior mesenteric vein, splenic vein, bilateral renal veins, IVC, and iliac venous system.   Review of the MIP images confirms the above findings.  Nonvascular findings: Visualized lung bases clear. Aberrant position of the gallbladder anterior to the right hepatic lobe. Unremarkable liver, spleen, pancreas, kidneys.  There is bilateral bilateral adrenal hypertrophy, right greater than left.  Common bile duct is prominent, measuring up to 10 mm diameter, seen down to the level of the ampulla.  There is also pancreatic duct dilatation to 3 mm through the head and body, also seen down to the level of the duodenum.  Stomach and small bowel are nondilated. Lipomatous ileocecal valve.  Appendix not discretely identified. The colon  is nondilated with a few scattered sigmoid diverticula. No ascites.  No free air.  No mesenteric, retroperitoneal, or pelvic adenopathy.  Urinary bladder is incompletely distended. Bilateral pelvic phleboliths.  Previous hysterectomy.  Degenerative and postoperative changes in the lumbar spine.  IMPRESSION:  1.  No significant proximal visceral arterial occlusive disease to suggest an etiology of occlusive mesenteric ischemia. 2.  Possible fibromuscular dysplasia (FMD) in the distal right renal artery.  Correlate with any clinical or  laboratory evidence of renovascular hypertension or renal dysfunction. 3.  Extrahepatic biliary ductal dilatation and pancreatic ductal dilatation as previously described.   Original Report Authenticated By: Osa Craver, M.D.     Medications:  Prior to Admission:  Prescriptions prior to admission  Medication Sig Dispense Refill  . acetaminophen-codeine (TYLENOL #3) 300-30 MG per tablet Take 1 tablet by mouth 2 (two) times daily as needed. For pain       . albuterol (PROVENTIL HFA;VENTOLIN HFA) 108 (90 BASE) MCG/ACT inhaler Inhale 2 puffs into the lungs every 4 (four) hours as needed. Shortness of breath       . citalopram (CELEXA) 20 MG tablet Take 10 mg by mouth daily.       Marland Kitchen dicyclomine (BENTYL) 20 MG tablet Take 1 tablet (20 mg total) by mouth 3 (three) times daily between meals as needed (stomach pain).  30 tablet  3  . ergocalciferol (VITAMIN D2) 50000 UNITS capsule Take 50,000 Units by mouth every 14 (fourteen) days.       . fentaNYL (DURAGESIC - DOSED MCG/HR) 12 MCG/HR Place 1 patch (12.5 mcg total) onto the skin every 3 (three) days.  10 patch  0  . fluticasone (FLONASE) 50 MCG/ACT nasal spray Place 2 sprays into the nose daily as needed. For allergies       . furosemide (LASIX) 40 MG tablet Take 1 tablet (40 mg total) by mouth daily.  30 tablet  0  . ipratropium-albuterol (DUONEB) 0.5-2.5 (3) MG/3ML SOLN Take 3 mLs by nebulization 3 (three) times daily as needed. Shortness of breath.      . lisinopril (PRINIVIL,ZESTRIL) 5 MG tablet Take 5 mg by mouth daily.      Marland Kitchen omeprazole (PRILOSEC) 20 MG capsule Take 20 mg by mouth daily.        . potassium chloride (K-DUR) 10 MEQ tablet Take 1 tablet (10 mEq total) by mouth daily.  30 tablet  6  . pregabalin (LYRICA) 75 MG capsule Take 75 mg by mouth 2 (two) times daily.        Marland Kitchen tiotropium (SPIRIVA) 18 MCG inhalation capsule Place 18 mcg into inhaler and inhale daily.        Marland Kitchen tiZANidine (ZANAFLEX) 4 MG capsule Take 4 mg by mouth at  bedtime.        . traMADol (ULTRAM) 50 MG tablet Take 100 mg by mouth every 6 (six) hours as needed. Maximum dose= 8 tablets per day For pain      . triamcinolone cream (KENALOG) 0.1 % Apply 1 application topically 2 (two) times daily as needed. Swelling in right arm.      . trimethoprim (TRIMPEX) 100 MG tablet Take 100 mg by mouth daily.       Scheduled:   . busPIRone  5 mg Oral BID  . cefTRIAXone (ROCEPHIN)  IV  1 g Intravenous Q24H  . docusate sodium  100 mg Oral BID  . enoxaparin (LOVENOX) injection  40 mg Subcutaneous Q24H  . feeding supplement  237 mL Oral BID BM  . fentaNYL  25 mcg Transdermal Q72H  . folic acid  1 mg Oral Daily  . influenza  inactive virus vaccine  0.5 mL Intramuscular Tomorrow-1000  . multivitamin with minerals  1 tablet Oral Daily  . nicotine  14 mg Transdermal Daily  . pantoprazole  40 mg Oral Q1200  . pregabalin  75 mg Oral BID  . sodium chloride  3 mL Intravenous Q12H  . thiamine  100 mg Oral Daily   Or  . thiamine  100 mg Intravenous Daily  . tiotropium  18 mcg Inhalation Daily  . tiZANidine  4 mg Oral QHS  . verapamil  120 mg Oral Daily   Continuous:   Assessment/Plan: Chronic abdominal pain, CT without acute pathology Multifocal atrial tachycardia, on verapamil, await further input per cardiology, COPD with continued tobacco use History of EtOH use daily, currently without signs of withdrawal UTI no culture available at this time, currently patient afebrile, DC Rocephin, continue Cipro times one to 2 days Chronic anxiety   LOS: 3 days   Theresa Blackwell D 03/08/2012, 9:22 AM

## 2012-03-09 MED ORDER — FENTANYL 25 MCG/HR TD PT72: 1.0000 | MEDICATED_PATCH | TRANSDERMAL | Status: DC

## 2012-03-09 MED ORDER — BISACODYL 5 MG PO TBEC: 5.0000 mg | DELAYED_RELEASE_TABLET | Freq: Every day | ORAL | Status: DC | PRN

## 2012-03-09 MED ORDER — ENSURE COMPLETE PO LIQD: 237.0000 mL | Freq: Two times a day (BID) | ORAL | Status: DC

## 2012-03-09 MED ORDER — DSS 100 MG PO CAPS: 100.0000 mg | ORAL_CAPSULE | Freq: Two times a day (BID) | ORAL | Status: AC

## 2012-03-09 MED ORDER — ADULT MULTIVITAMIN W/MINERALS CH: 1.0000 | ORAL_TABLET | Freq: Every day | ORAL | Status: AC

## 2012-03-09 MED ORDER — BUSPIRONE HCL 5 MG PO TABS: 5.0000 mg | ORAL_TABLET | Freq: Two times a day (BID) | ORAL | Status: DC

## 2012-03-09 MED ORDER — NICOTINE 14 MG/24HR TD PT24: 1.0000 | MEDICATED_PATCH | Freq: Every day | TRANSDERMAL | Status: AC

## 2012-03-09 MED ORDER — VERAPAMIL HCL ER 120 MG PO TBCR: 120.0000 mg | EXTENDED_RELEASE_TABLET | Freq: Every day | ORAL | Status: DC

## 2012-03-09 MED ORDER — THIAMINE HCL 100 MG PO TABS: 100.0000 mg | ORAL_TABLET | Freq: Every day | ORAL | Status: AC

## 2012-03-09 NOTE — Progress Notes (Signed)
Subjective: Patient pleasantly confused, no apparent distress. She's unaware plans to skilled nursing facility has been planned however friends in the room, where. No issues per nursing. Cardiology input noted  Objective: Vital signs in last 24 hours: Temp:  [98.2 F (36.8 C)] 98.2 F (36.8 C) (09/15 0426) Pulse Rate:  [65-100] 65  (09/15 0426) Resp:  [18] 18  (09/15 0426) BP: (98-117)/(61-91) 117/75 mmHg (09/15 0426) SpO2:  [96 %-100 %] 98 % (09/15 0426) Weight:  [71.3 kg (157 lb 3 oz)] 71.3 kg (157 lb 3 oz) (09/15 0426) Weight change: 1.3 kg (2 lb 13.9 oz) Last BM Date: 03/05/12  Intake/Output from previous day: 09/14 0701 - 09/15 0700 In: 480 [P.O.:480] Out: 1075 [Urine:1075] Intake/Output this shift:    General appearance: cooperative Resp: clear to auscultation bilaterally Cardio: Regular rate and rhythm Extremities: extremities normal, atraumatic, no cyanosis or edema  Lab Results:  No results found for this or any previous visit (from the past 24 hour(s)).    Studies/Results: No results found.  Medications:  Prior to Admission:  Prescriptions prior to admission  Medication Sig Dispense Refill  . acetaminophen-codeine (TYLENOL #3) 300-30 MG per tablet Take 1 tablet by mouth 2 (two) times daily as needed. For pain       . albuterol (PROVENTIL HFA;VENTOLIN HFA) 108 (90 BASE) MCG/ACT inhaler Inhale 2 puffs into the lungs every 4 (four) hours as needed. Shortness of breath       . citalopram (CELEXA) 20 MG tablet Take 10 mg by mouth daily.       Marland Kitchen dicyclomine (BENTYL) 20 MG tablet Take 1 tablet (20 mg total) by mouth 3 (three) times daily between meals as needed (stomach pain).  30 tablet  3  . ergocalciferol (VITAMIN D2) 50000 UNITS capsule Take 50,000 Units by mouth every 14 (fourteen) days.       . fentaNYL (DURAGESIC - DOSED MCG/HR) 12 MCG/HR Place 1 patch (12.5 mcg total) onto the skin every 3 (three) days.  10 patch  0  . fluticasone (FLONASE) 50 MCG/ACT nasal  spray Place 2 sprays into the nose daily as needed. For allergies       . furosemide (LASIX) 40 MG tablet Take 1 tablet (40 mg total) by mouth daily.  30 tablet  0  . ipratropium-albuterol (DUONEB) 0.5-2.5 (3) MG/3ML SOLN Take 3 mLs by nebulization 3 (three) times daily as needed. Shortness of breath.      . lisinopril (PRINIVIL,ZESTRIL) 5 MG tablet Take 5 mg by mouth daily.      Marland Kitchen omeprazole (PRILOSEC) 20 MG capsule Take 20 mg by mouth daily.        . potassium chloride (K-DUR) 10 MEQ tablet Take 1 tablet (10 mEq total) by mouth daily.  30 tablet  6  . pregabalin (LYRICA) 75 MG capsule Take 75 mg by mouth 2 (two) times daily.        Marland Kitchen tiotropium (SPIRIVA) 18 MCG inhalation capsule Place 18 mcg into inhaler and inhale daily.        Marland Kitchen tiZANidine (ZANAFLEX) 4 MG capsule Take 4 mg by mouth at bedtime.        . traMADol (ULTRAM) 50 MG tablet Take 100 mg by mouth every 6 (six) hours as needed. Maximum dose= 8 tablets per day For pain      . triamcinolone cream (KENALOG) 0.1 % Apply 1 application topically 2 (two) times daily as needed. Swelling in right arm.      . trimethoprim (TRIMPEX)  100 MG tablet Take 100 mg by mouth daily.       Scheduled:   . busPIRone  5 mg Oral BID  . ciprofloxacin  250 mg Oral BID  . docusate sodium  100 mg Oral BID  . enoxaparin (LOVENOX) injection  40 mg Subcutaneous Q24H  . feeding supplement  237 mL Oral BID BM  . fentaNYL  25 mcg Transdermal Q72H  . folic acid  1 mg Oral Daily  . multivitamin with minerals  1 tablet Oral Daily  . nicotine  14 mg Transdermal Daily  . pantoprazole  40 mg Oral Q1200  . pregabalin  75 mg Oral BID  . sodium chloride  3 mL Intravenous Q12H  . thiamine  100 mg Oral Daily   Or  . thiamine  100 mg Intravenous Daily  . tiotropium  18 mcg Inhalation Daily  . tiZANidine  4 mg Oral QHS  . verapamil  120 mg Oral Daily  . DISCONTD: cefTRIAXone (ROCEPHIN)  IV  1 g Intravenous Q24H   Continuous:   Assessment/Plan: Chronic abdominal  pain, CT without acute pathology , patient tolerating meals without difficulty. Multifocal atrial tachycardia, on verapamil, currently in normal sinus rhythm, cardiology input noted  COPD with continued tobacco use  History of EtOH use daily, currently without signs of withdrawal  UTI no culture available   Chronic anxiety  Deconditioning plans for skilled nursing facility  LOS: 4 days   Marshawn Normoyle D 03/09/2012, 8:33 AM

## 2012-03-10 MED ORDER — BUSPIRONE HCL 5 MG PO TABS: 5.0000 mg | ORAL_TABLET | Freq: Two times a day (BID) | ORAL | Status: AC

## 2012-03-10 MED ORDER — FENTANYL 25 MCG/HR TD PT72: 1.0000 | MEDICATED_PATCH | TRANSDERMAL | Status: AC

## 2012-03-10 MED ORDER — PREGABALIN 75 MG PO CAPS: 75.0000 mg | ORAL_CAPSULE | Freq: Two times a day (BID) | ORAL | Status: AC

## 2012-03-10 NOTE — Discharge Summary (Signed)
Physician Discharge Summary  Patient ID: Theresa Blackwell MRN: [id number] DOB/AGE: 1931/05/14 78 y.o.  Admit date: 03/05/2012 Discharge date: 03/10/2012  Admission Diagnoses:  Discharge Diagnoses:  Active Problems:  Shortness of breath  Abdominal pain UTI Weakness, failure to thrive Hypokalemia  COPD (chronic obstructive pulmonary disease) with acute bronchitis  Multifocal atrial tachycardia  HX: breast cancer  Anxiety  Tobacco abuse  Chronic respiratory failure  Fibromyalgia/chronic pain   Discharged Condition: good  Hospital Course: 78 years old female with multiple medical issues including history of hypertension, COPD tobacco use, chronic pain syndrome fibromyalgia, chronic abdominal pain. Patient was currently being worked up outpatient by GI for abdominal pain she had colonoscopy in the past. Incidental finding of biliary duct dilation; outpatient workup for ultrasound-guided endoscopy scheduled. Patient presented with shortness of breath tachycardia; questionable A. Fib. Cardiac: patient was admitted telemetry, she ruled out cardiac markers negative, her blood pressure remained stable. Initially he was thought to be A. Fib but after the further evaluation and cardiology consult it was thought to be MAT; secondary to COPD and tobacco use. Patient was briefly started on diltiazem drip then was switched to p.o. Verapamil. Patient's heart rate controlled in the 60s to 70s. Blood pressure remains stable. She remained normal sinus rhythm. 2-D echocardiogram showed EF of 60%. Unremarkable There was some EKG changes when she had rhythm of 150s- cardiology suggested possible outpatient stress test if needed He would have remained stable. Hypertension: she was on lisinopril Lasix which was discontinued. Maintain her verapamil blood pressure in the 120s Hypokalemia: due to Lasix and mild dehydration, potassium replacement, pressure on discharge was 4.1. Abdominal pain: patient had chronic  abdominal pain on and off outpatient workup has been ongoing she had extensive workup in the past. CT scan of the abdomen; CT Angio; was negative for any mesenteric ischemia, incidental finding of dilated common bile the was noted again without any masses. Otherwise was unremarkable CT scan. Patient will follow up outpatient with Dr. Christella Hartigan- for ultrasound-guided endoscopy scheduled outpatient on September 26- please have followup outpatient with Dr. Christella Hartigan at [facility]. Blood work remained normal including white count as well as lipase and LFTs. Chronic pain syndrome/fibromyalgia: patient was taking Tylenol with Codeine at [facility] home, possible causing her nausea which was discontinued, her fentanyl patch was increased to 25 mcg from 12 mcg with good pain control. Avoid any p.o. Narcotics. Chronic anxiety/depression: Celexa was discontinued because possible nausea started on BuSpar 5 mg b.i.d. Titrate as tolerated COPD/tobacco use currently using the nicotine patch which can be tapered off, nebulizer treatments; Spiriva. Oxygen 2 L. History of breast cancer with right lymphedma chronic stable UTI/cystitis: patient was treated with Rocephin IV in the hospital finished a course of antibiotics. Osteoarthritis, shoulder pain continue pain patch; Tylenol p.r.n. History of CHF diastolic in the past, she is off Lasix, euvolemic. GERD continue on PPI omeprazole Deconditioning and weakness: patient will continue with physical therapy recommended skilled nursing facility; for rehabilitation   Consults: cardiology  Significant Diagnostic Studies: labs: normal white count, urinalysis was positive cultures negative, potassium at time of discharge 4.1 creatinine 1.0. LFTs and lipase normal. and radiology: CXR: normal and COPD and CT scan: negative for mesenteric ischemia, nonspecific dilation of common bile duct noted again..  Treatments: IV hydration and antibiotics: ceftriaxone  Discharge Exam: Blood  pressure 121/48, pulse 84, temperature 98.2 F (36.8 C), temperature source Oral, resp. rate 18, height 5\' 5"  (1.651 m), weight 70.8 kg (156 lb 1.4 oz), SpO2  97.00%. General appearance: alert Resp: clear to auscultation bilaterally Cardio: regular rate and rhythm GI: soft, non-tender; bowel sounds normal; no masses,  no organomegaly  Disposition: skilled nursing facility for short-term rehabilitation.  Discharge Orders    Future Orders Please Complete By Expires   Diet - low sodium heart healthy      Increase activity slowly          Medication List     As of 03/10/2012  8:28 AM    STOP taking these medications         acetaminophen-codeine 300-30 MG per tablet   Commonly known as: TYLENOL #3      citalopram 20 MG tablet   Commonly known as: CELEXA      fentaNYL 12 MCG/HR   Commonly known as: DURAGESIC - dosed mcg/hr   Replaced by: fentaNYL 25 MCG/HR      furosemide 40 MG tablet   Commonly known as: LASIX      lisinopril 5 MG tablet   Commonly known as: PRINIVIL,ZESTRIL      potassium chloride 10 MEQ tablet   Commonly known as: K-DUR      traMADol 50 MG tablet   Commonly known as: ULTRAM      TAKE these medications         albuterol 108 (90 BASE) MCG/ACT inhaler   Commonly known as: PROVENTIL HFA;VENTOLIN HFA   Inhale 2 puffs into the lungs every 4 (four) hours as needed. Shortness of breath      bisacodyl 5 MG EC tablet   Commonly known as: DULCOLAX   Take 1 tablet (5 mg total) by mouth daily as needed.      busPIRone 5 MG tablet   Commonly known as: BUSPAR   Take 1 tablet (5 mg total) by mouth 2 (two) times daily.      dicyclomine 20 MG tablet   Commonly known as: BENTYL   Take 1 tablet (20 mg total) by mouth 3 (three) times daily between meals as needed (stomach pain).      DSS 100 MG Caps   Take 100 mg by mouth 2 (two) times daily.      ergocalciferol 50000 UNITS capsule   Commonly known as: VITAMIN D2   Take 50,000 Units by mouth every 14  (fourteen) days.      feeding supplement Liqd   Take 237 mLs by mouth 2 (two) times daily between meals.      fentaNYL 25 MCG/HR   Commonly known as: DURAGESIC - dosed mcg/hr   Place 1 patch (25 mcg total) onto the skin every 3 (three) days.      fluticasone 50 MCG/ACT nasal spray   Commonly known as: FLONASE   Place 2 sprays into the nose daily as needed. For allergies      ipratropium-albuterol 0.5-2.5 (3) MG/3ML Soln   Commonly known as: DUONEB   Take 3 mLs by nebulization 3 (three) times daily as needed. Shortness of breath.      multivitamin with minerals Tabs   Take 1 tablet by mouth daily.      nicotine 14 mg/24hr patch   Commonly known as: NICODERM CQ - dosed in mg/24 hours   Place 1 patch onto the skin daily.      omeprazole 20 MG capsule   Commonly known as: PRILOSEC   Take 20 mg by mouth daily.      pregabalin 75 MG capsule   Commonly known as: LYRICA   Take 1  capsule (75 mg total) by mouth 2 (two) times daily.      thiamine 100 MG tablet   Take 1 tablet (100 mg total) by mouth daily.      tiotropium 18 MCG inhalation capsule   Commonly known as: SPIRIVA   Place 18 mcg into inhaler and inhale daily.      tiZANidine 4 MG capsule   Commonly known as: ZANAFLEX   Take 4 mg by mouth at bedtime.      triamcinolone cream 0.1 %   Commonly known as: KENALOG   Apply 1 application topically 2 (two) times daily as needed. Swelling in right arm.      trimethoprim 100 MG tablet   Commonly known as: TRIMPEX   Take 100 mg by mouth daily.      verapamil 120 MG CR tablet   Commonly known as: CALAN-SR   Take 1 tablet (120 mg total) by mouth daily.         SignedGeorgann Housekeeper 03/10/2012, 8:28 AM

## 2012-03-10 NOTE — Progress Notes (Signed)
Late Entry: Clinical Social Work Department CLINICAL SOCIAL WORK PLACEMENT NOTE 03/10/2012  Patient:  Theresa Blackwell, Theresa Blackwell  Account Number:  [id number] Admit date:  03/05/2012  Clinical Social Worker:  Lesha Jager Lubertha Basque  Date/time:  03/06/2012 02:00 PM  Clinical Social Work is seeking post-discharge placement for this patient at the following level of care:   SKILLED NURSING   (*CSW will update this form in Epic as items are completed)   03/06/2012  Patient/family provided with [facility] Department of Clinical Social Work's list of facilities offering this level of care within the geographic area requested by the patient (or if unable, by the patient's family).  03/06/2012  Patient/family informed of their freedom to choose among providers that offer the needed level of care, that participate in Medicare, Medicaid or managed care program needed by the patient, have an available bed and are willing to accept the patient.  03/06/2012  Patient/family informed of [facility]' ownership interest in [facility], as well as of the fact that they are under no obligation to receive care at this facility.  PASARR submitted to EDS on 03/06/2012 PASARR number received from [employer] on 03/06/2012  FL2 transmitted to all facilities in geographic area requested by pt/family on  03/06/2012 FL2 transmitted to all facilities within larger geographic area on   Patient informed that his/her managed care company has contracts with or will negotiate with  certain facilities, including the following:     Patient/family informed of bed offers received:  03/10/2012 Patient chooses bed at [facility], [place] Physician recommends and patient chooses bed at    [facility] [place] on  03/10/2012 Patient to be transferred to facility by [facility]  The following physician request were entered in Epic:   Additional Comments:  Sabino Niemann,  MSW, LCSWA [phone]

## 2012-03-10 NOTE — Progress Notes (Signed)
Clinical social worker assisted with patient discharge to skilled nursing facility, [facility] .  CSW addressed all family questions and concerns. CSW copied chart and added all important documents. CSW also set up patient transportation with [facility]. Clinical Social Worker will sign off for now as social work intervention is no longer needed.   Lola Lofaro, MSW, LCSWA [phone] 

## 2012-03-12 ENCOUNTER — Telehealth: Payer: Self-pay

## 2012-03-12 NOTE — Telephone Encounter (Signed)
eus appt change to 04/03/12 1230 pm

## 2012-03-12 NOTE — Telephone Encounter (Signed)
Pt family member has been given the new appt date and time the pt is currently at [facility] and if the pt has not been released by the time of appt they will call back

## 2012-03-14 ENCOUNTER — Telehealth: Payer: Self-pay | Admitting: Gastroenterology

## 2012-03-14 NOTE — Telephone Encounter (Signed)
Colonoscopy by Dr. Laural Benes December 2012. This was done for "high risk for colon cancer surveillance, personal history of colon polyps, hematochezia". A small sessile polyp was found in the descending colon, she was also found diverticulosis on the right side and left side of her colon. Pathology showed no adenomatous change.  She was recommended to have a repeat colonoscopy at 5 year interval, which would be age 78.

## 2012-03-19 ENCOUNTER — Telehealth: Payer: Self-pay | Admitting: Gastroenterology

## 2012-03-19 NOTE — Telephone Encounter (Signed)
Forward 15 pages from [facility] to Dr. Rob Bunting for review on 03-19-12 ym

## 2012-04-03 ENCOUNTER — Ambulatory Visit (HOSPITAL_COMMUNITY)
Admission: RE | Admit: 2012-04-03 | Discharge: 2012-04-03 | Disposition: A | Discharge status: Home / Self Care | Payer: Medicare Other | Source: Ambulatory Visit | Attending: Gastroenterology | Admitting: Gastroenterology

## 2012-04-03 ENCOUNTER — Ambulatory Visit (HOSPITAL_COMMUNITY): Payer: Medicare Other | Admitting: Anesthesiology

## 2012-04-03 ENCOUNTER — Encounter (HOSPITAL_COMMUNITY): Payer: Self-pay | Admitting: Gastroenterology

## 2012-04-03 ENCOUNTER — Encounter (HOSPITAL_COMMUNITY)
Admission: RE | Disposition: A | Discharge status: Home / Self Care | Payer: Self-pay | Source: Ambulatory Visit | Attending: Gastroenterology

## 2012-04-03 ENCOUNTER — Encounter (HOSPITAL_COMMUNITY): Payer: Self-pay | Admitting: Anesthesiology

## 2012-04-03 DIAGNOSIS — IMO0001 Reserved for inherently not codable concepts without codable children: Secondary | ICD-10-CM | POA: Insufficient documentation

## 2012-04-03 DIAGNOSIS — K838 Other specified diseases of biliary tract: Secondary | ICD-10-CM

## 2012-04-03 DIAGNOSIS — R109 Unspecified abdominal pain: Secondary | ICD-10-CM | POA: Insufficient documentation

## 2012-04-03 DIAGNOSIS — Z853 Personal history of malignant neoplasm of breast: Secondary | ICD-10-CM | POA: Insufficient documentation

## 2012-04-03 DIAGNOSIS — I2510 Atherosclerotic heart disease of native coronary artery without angina pectoris: Secondary | ICD-10-CM | POA: Insufficient documentation

## 2012-04-03 DIAGNOSIS — J449 Chronic obstructive pulmonary disease, unspecified: Secondary | ICD-10-CM | POA: Insufficient documentation

## 2012-04-03 DIAGNOSIS — J4489 Other specified chronic obstructive pulmonary disease: Secondary | ICD-10-CM | POA: Insufficient documentation

## 2012-04-03 DIAGNOSIS — Z79899 Other long term (current) drug therapy: Secondary | ICD-10-CM | POA: Insufficient documentation

## 2012-04-03 DIAGNOSIS — R110 Nausea: Secondary | ICD-10-CM | POA: Insufficient documentation

## 2012-04-03 HISTORY — DX: Gastro-esophageal reflux disease without esophagitis: K21.9

## 2012-04-03 HISTORY — PX: EUS: SHX5427

## 2012-04-03 SURGERY — UPPER ENDOSCOPIC ULTRASOUND (EUS) LINEAR
Anesthesia: Monitor Anesthesia Care

## 2012-04-03 MED ORDER — PROPOFOL 10 MG/ML IV EMUL
INTRAVENOUS | Status: DC | PRN
  Administered 2012-04-03: 100 ug/kg/min via INTRAVENOUS

## 2012-04-03 MED ORDER — KETAMINE HCL 10 MG/ML IJ SOLN
INTRAMUSCULAR | Status: DC | PRN
  Administered 2012-04-03: 11 mg via INTRAVENOUS

## 2012-04-03 MED ORDER — LACTATED RINGERS IV SOLN
INTRAVENOUS | Status: DC | PRN
  Administered 2012-04-03: 13:00:00 via INTRAVENOUS

## 2012-04-03 MED ORDER — FENTANYL CITRATE 0.05 MG/ML IJ SOLN
INTRAMUSCULAR | Status: DC | PRN
  Administered 2012-04-03: 100 ug via INTRAVENOUS

## 2012-04-03 MED ORDER — MIDAZOLAM HCL 5 MG/5ML IJ SOLN
INTRAMUSCULAR | Status: DC | PRN
  Administered 2012-04-03: 2 mg via INTRAVENOUS

## 2012-04-03 MED ORDER — ONDANSETRON HCL 4 MG/2ML IJ SOLN
INTRAMUSCULAR | Status: DC | PRN
  Administered 2012-04-03: 4 mg via INTRAVENOUS

## 2012-04-03 MED ORDER — LIDOCAINE HCL (CARDIAC) 20 MG/ML IV SOLN
INTRAVENOUS | Status: DC | PRN
  Administered 2012-04-03: 100 mg via INTRAVENOUS

## 2012-04-03 MED ORDER — SODIUM CHLORIDE 0.9 % IV SOLN: INTRAVENOUS | Status: DC

## 2012-04-03 MED ORDER — BUTAMBEN-TETRACAINE-BENZOCAINE 2-2-14 % EX AERO
INHALATION_SPRAY | CUTANEOUS | Status: DC | PRN
  Administered 2012-04-03: 2 via TOPICAL

## 2012-04-03 NOTE — Transfer of Care (Signed)
Immediate Anesthesia Transfer of Care Note  Patient: Theresa Blackwell  Procedure(s) Performed: Procedure(s) (LRB): UPPER ENDOSCOPIC ULTRASOUND (EUS) LINEAR (N/A)  Patient Location: PACU  Anesthesia Type: MAC  Level of Consciousness: sedated, patient cooperative and responds to stimulaton  Airway & Oxygen Therapy: Patient Spontanous Breathing and Patient connected to face mask oxgen  Post-op Assessment: Report given to PACU RN and Post -op Vital signs reviewed and stable  Post vital signs: Reviewed and stable  Complications: No apparent anesthesia complications

## 2012-04-03 NOTE — Anesthesia Preprocedure Evaluation (Addendum)
Anesthesia Evaluation  Patient identified by MRN, date of birth, ID band Patient awake    Reviewed: Allergy & Precautions, H&P , NPO status , Patient's Chart, lab work & pertinent test results  History of Anesthesia Complications Negative for: history of anesthetic complications  Airway Mallampati: II TM Distance: >3 FB Neck ROM: Full    Dental  (+) Dental Advisory Given, Edentulous Upper and Edentulous Lower   Pulmonary shortness of breath and with exertion, COPD COPD inhaler, Current Smoker,  breath sounds clear to auscultation  Pulmonary exam normal       Cardiovascular + CAD and +CHF + dysrhythmias Atrial Fibrillation + Valvular Problems/Murmurs AI Rhythm:Regular Rate:Normal  - Left ventricle: The cavity size was normal. Wall thickness   was normal. The estimated ejection fraction was 55%. Wall   motion was normal; there were no regional wall motion   abnormalities. The study is not technically sufficient to   allow evaluation of LV diastolic function due to freqeunt   PACs. - Aortic valve: Mild regurgitation. - Mitral valve: Calcified annulus. Impressions:  - Very limited study due to poor sound wave transmission. LV   function appears normal. Patient with frequnet PACs during   study.    Neuro/Psych  Headaches, PSYCHIATRIC DISORDERS Anxiety Depression  Neuromuscular disease    GI/Hepatic Neg liver ROS, GERD-  Medicated,  Endo/Other  negative endocrine ROS  Renal/GU Renal InsufficiencyRenal disease     Musculoskeletal  (+) Arthritis -, Osteoarthritis,  Fibromyalgia -, narcotic dependent  Abdominal   Peds  Hematology negative hematology ROS (+)   Anesthesia Other Findings   Reproductive/Obstetrics                      Anesthesia Physical Anesthesia Plan  ASA: III  Anesthesia Plan: MAC   Post-op Pain Management:    Induction: Intravenous  Airway Management Planned: Simple Face  Mask  Additional Equipment:   Intra-op Plan:   Post-operative Plan:   Informed Consent: I have reviewed the patients History and Physical, chart, labs and discussed the procedure including the risks, benefits and alternatives for the proposed anesthesia with the patient or authorized representative who has indicated his/her understanding and acceptance.   Dental advisory given  Plan Discussed with: CRNA  Anesthesia Plan Comments:         Anesthesia Quick Evaluation

## 2012-04-03 NOTE — Anesthesia Postprocedure Evaluation (Signed)
Anesthesia Post Note  Patient: Theresa Blackwell  Procedure(s) Performed: Procedure(s) (LRB): UPPER ENDOSCOPIC ULTRASOUND (EUS) LINEAR (N/A)  Anesthesia type: MAC  Patient location: PACU  Post pain: Pain level controlled  Post assessment: Post-op Vital signs reviewed  Last Vitals: BP 106/72  Temp 36.6 C (Oral)  Resp 20  Ht 5\' 7"  (1.702 m)  Wt 175 lb (79.379 kg)  BMI 27.41 kg/m2  SpO2 97%  Post vital signs: Reviewed  Level of consciousness: awake  Complications: No apparent anesthesia complications

## 2012-04-03 NOTE — Interval H&P Note (Signed)
History and Physical Interval Note:  04/03/2012 11:56 AM  Theresa Blackwell  has presented today for surgery, with the diagnosis of Dilated bile duct [576.8]  The various methods of treatment have been discussed with the patient and family. After consideration of risks, benefits and other options for treatment, the patient has consented to  Procedure(s) (LRB) with comments: UPPER ENDOSCOPIC ULTRASOUND (EUS) LINEAR (N/A) - radial linear as a surgical intervention .  The patient's history has been reviewed, patient examined, no change in status, stable for surgery.  I have reviewed the patient's chart and labs.  Questions were answered to the patient's satisfaction.     Rob Bunting

## 2012-04-03 NOTE — H&P (View-Only) (Signed)
HPI: This is a   78-year-old woman whom I am meeting for the first time today.  I was called last month to meet her since her bile that was swollen and she was reported to have never met a gastroenterologist before.  Dr. Brodie saw her when she was in the hospital actually, she also had previously undergone a colonoscopy at the gastroenterology with Dr. Johnson. This colonoscopy, from what the family can remember, was done for her chronic abdominal pain and nausea. I do not have that result available.  While she was hospitalized last month she underwent a CT scan of her abdomen and pelvis, an MRI with MRCP images, an ultrasound, an acute HIDA scan, she had multiple blood tests done including normal LFTs, normal CEA level, normal CA 19 9 level.  Has lower abdominal pain has been going on for 2-3 years.  The pain is there all of the time, can wax and wane.  Bad night last night.  Nausea about all the time.   Has a BM most days.    She is telling me she has significant abdominal pain and nausea for at least 2-3 years. She is not a very good historian. Her son and husband are with her. She is a bit tremulous today in the office. She tells me that she saw her primary care physician last week and her husband was under the impression that today's visit was to perform some nuclear medicine scan of her gallbladder such as a HIDA scan.  She had a colonoscopy with Dr. Johnson 12/12 (I don't have those records). They think that the colonoscopy was done because of the pains.  [facility]. They aren't sure of what Dr. Johnson found or whether he could explain the pains.    Her husband thinks she has a UTI now.    Saw Dr. Brodie in hospital 5-6 weeks ago.  shakey  Review of systems: Pertinent positive and negative review of systems were noted in the above HPI section. Complete review of systems was performed and was otherwise normal.    Past Medical History  Diagnosis Date  . Coronary artery  disease   . Breast CA rt breast  . Anxiety   . Smoking   . Fibromyalgia   . COPD (chronic obstructive pulmonary disease)     Past Surgical History  Procedure Date  . Breast surgery   . Abdominal hysterectomy   . Appendectomy     Current Outpatient Prescriptions  Medication Sig Dispense Refill  . acetaminophen-codeine (TYLENOL #3) 300-30 MG per tablet Take 1 tablet by mouth 2 (two) times daily as needed. For pain       . albuterol (PROVENTIL HFA;VENTOLIN HFA) 108 (90 BASE) MCG/ACT inhaler Inhale 2 puffs into the lungs every 4 (four) hours as needed. Shortness of breath       . citalopram (CELEXA) 20 MG tablet Take 10 mg by mouth daily.       . dicyclomine (BENTYL) 20 MG tablet Take 1 tablet (20 mg total) by mouth 3 (three) times daily between meals as needed (stomach pain).  30 tablet  3  . ergocalciferol (VITAMIN D2) 50000 UNITS capsule Take 50,000 Units by mouth every 14 (fourteen) days.       . fentaNYL (DURAGESIC - DOSED MCG/HR) 12 MCG/HR Place 1 patch (12.5 mcg total) onto the skin every 3 (three) days.  10 patch  0  . fluticasone (FLONASE) 50 MCG/ACT nasal spray Place 2 sprays into the nose daily   as needed. For allergies       . furosemide (LASIX) 40 MG tablet Take 1 tablet (40 mg total) by mouth daily.  30 tablet  0  . ipratropium-albuterol (DUONEB) 0.5-2.5 (3) MG/3ML SOLN Take 3 mLs by nebulization 3 (three) times daily as needed. Shortness of breath.      . lisinopril (PRINIVIL,ZESTRIL) 5 MG tablet Take 5 mg by mouth daily.      . omeprazole (PRILOSEC) 20 MG capsule Take 20 mg by mouth daily.        . potassium chloride (K-DUR) 10 MEQ tablet Take 1 tablet (10 mEq total) by mouth daily.  30 tablet  6  . pregabalin (LYRICA) 75 MG capsule Take 75 mg by mouth 2 (two) times daily.        . tiotropium (SPIRIVA) 18 MCG inhalation capsule Place 18 mcg into inhaler and inhale daily.        . tiZANidine (ZANAFLEX) 4 MG capsule Take 4 mg by mouth at bedtime.        . traMADol (ULTRAM) 50  MG tablet Take 100 mg by mouth every 6 (six) hours as needed. Maximum dose= 8 tablets per day For pain      . triamcinolone cream (KENALOG) 0.1 % Apply 1 application topically 2 (two) times daily as needed. Swelling in right arm.      . trimethoprim (TRIMPEX) 100 MG tablet Take 100 mg by mouth daily.        Allergies as of 03/04/2012  . (No Known Allergies)    Family History  Problem Relation Age of Onset  . Colon cancer Brother   . Heart disease Father     History   Social History  . Marital Status: Widowed    Spouse Name: N/A    Number of Children: 2  . Years of Education: N/A   Occupational History  . Retired    Social History Main Topics  . Smoking status: Current Everyday Smoker -- 2.0 packs/day  . Smokeless tobacco: Never Used  . Alcohol Use: No  . Drug Use: No  . Sexually Active: Not on file   Other Topics Concern  . Not on file   Social History Narrative  . No narrative on file       Physical Exam: BP 112/60  Pulse 60  Ht 5' 5" (1.651 m)  Wt 153 lb (69.4 kg)  BMI 25.46 kg/m2 Constitutional:  Chronically debilitated, sits in a wheelchair Psychiatric: alert and oriented x3 Eyes: extraocular movements intact Mouth: oral pharynx moist, no lesions Neck: supple no lymphadenopathy Cardiovascular: heart regular rate and rhythm Lungs: clear to auscultation bilaterally Abdomen: soft, nontender, nondistended, no obvious ascites, no peritoneal signs, normal bowel sounds Extremities: no lower extremity edema bilaterally Skin: no lesions on visible extremities    Assessment and plan: 78 y.o. female with  chronic abdominal pain, chronic nausea  80 get her records from her previous gastroenterologist Dr. Johnson at [facility]. He performed a colonoscopy for her less than a year ago, family believes it was for chronic abdominal pain. It is not clear what he found or what he recommended.  She had multiple imaging and blood tests last month while  hospitalized. The only finding of note was a dilated bile duct. This was on MRI as well as ultrasound.  It is not clear to me if that is a pathologic finding for her or whether it is contributing to her chronic nausea and abdominal pain. Importantly, her liver tests   were normal during that admission leading me to believe that her bile but is not responsible for her pains. Further evaluation with endoscopic ultrasound is reasonable I will schedule that at her soonest convenience. That should be done with propofol sedation. I think a more likely explanation for her nausea and possibly even her abdominal pains it is a fact that she is on 2 forms of narcotic pain medicines, both of which can cause nausea obviously. She takes Celexa, but #1 side effect of this is nausea. She takes tramadol, this is also known to cause nausea. I recommended she try cutting back on all of these various medicines as best that she can. We will try to get records from her previous gastroenterologist.     

## 2012-04-03 NOTE — Preoperative (Signed)
Beta Blockers   Reason not to administer Beta Blockers:NA 

## 2012-04-03 NOTE — Op Note (Signed)
[facility] [address] [place], [address]   ENDOSCOPIC ULTRASOUND PROCEDURE REPORT  PATIENT: Theresa Blackwell, Theresa Blackwell  MR#: [id number] BIRTHDATE: 08-25-1930  GENDER: Female ENDOSCOPIST: Rachael Fee, MD REFERRED BY:  Hart Carwin, M.D. PROCEDURE DATE:  04/03/2012 PROCEDURE:   Upper EUS ASA CLASS:      Class III INDICATIONS:   chronic nausea, abd pains; dilated bile duct on recent imaging (normal LFTs), no sign of pancreatic or biliary masses. MEDICATIONS: MAC sedation, administered by CRNA  DESCRIPTION OF PROCEDURE:   After the risks benefits and alternatives of the procedure were  explained, informed consent was obtained. The patient was then placed in the left, lateral, decubitus postion and IV sedation was administered. Throughout the procedure, the patients blood pressure, pulse and oxygen saturations were monitored continuously.  Under direct visualization, the Pentax EUS Radial [id number]  endoscope was introduced through the mouth  and advanced to the second portion of the duodenum .  Water was used as necessary to provide an acoustic interface.  Upon completion of the imaging, water was removed and the patient was sent to the recovery room in satisfactory condition.    Endoscopic findings: 1. Normal UGI tract  EUS findings: 1. Pancreatic parenchyma was very slightly honeycombed throughout, raising quesion of chronic pancreatitis. There were no discrete mass lesions however. 2. CBD was dilated 8.78mm, contained no stones or soft tissue masses and tapered smoothly into head of pancreas. 3. Main pancreatic duct was 4mm in head, slightly hyperechoic walls 4. No peripancreatic adenopathy 5. Limited view of liver, spleen, portal and splenic vessels were all normal  Impression: Possible mild chronic pancreatitis but no discrete masses in pancreas or bile duct. The bile duct was mildly dilated.  Given normal liver tests, the clinical significance of  these findings are not clear. Would simply follow her clinically.  Nausea, which seems to be her main symptom, is at least partly due to daily chronic narcotic pain medicines.   _______________________________ eSignedRachael Fee, MD 04/03/2012 1:26 PM

## 2012-04-10 ENCOUNTER — Encounter (HOSPITAL_COMMUNITY): Payer: Self-pay | Admitting: Gastroenterology

## 2012-07-31 ENCOUNTER — Other Ambulatory Visit: Payer: Self-pay

## 2012-07-31 ENCOUNTER — Other Ambulatory Visit: Payer: Self-pay | Admitting: Internal Medicine

## 2012-07-31 DIAGNOSIS — J449 Chronic obstructive pulmonary disease, unspecified: Secondary | ICD-10-CM

## 2012-07-31 DIAGNOSIS — Z720 Tobacco use: Secondary | ICD-10-CM

## 2012-07-31 DIAGNOSIS — Z1231 Encounter for screening mammogram for malignant neoplasm of breast: Secondary | ICD-10-CM

## 2012-08-04 ENCOUNTER — Ambulatory Visit (HOSPITAL_COMMUNITY)
Admission: RE | Admit: 2012-08-04 | Discharge: 2012-08-04 | Disposition: A | Discharge status: Home / Self Care | Payer: Medicare Other | Source: Ambulatory Visit | Attending: Internal Medicine | Admitting: Internal Medicine

## 2012-08-04 DIAGNOSIS — J449 Chronic obstructive pulmonary disease, unspecified: Secondary | ICD-10-CM | POA: Insufficient documentation

## 2012-08-04 DIAGNOSIS — F17200 Nicotine dependence, unspecified, uncomplicated: Secondary | ICD-10-CM | POA: Insufficient documentation

## 2012-08-04 DIAGNOSIS — J4489 Other specified chronic obstructive pulmonary disease: Secondary | ICD-10-CM | POA: Insufficient documentation

## 2012-08-04 MED ORDER — ALBUTEROL SULFATE (5 MG/ML) 0.5% IN NEBU
2.5000 mg | INHALATION_SOLUTION | Freq: Once | RESPIRATORY_TRACT | Status: AC
  Administered 2012-08-04: 2.5 mg via RESPIRATORY_TRACT

## 2012-09-02 ENCOUNTER — Ambulatory Visit: Payer: Medicare Other

## 2012-10-08 ENCOUNTER — Ambulatory Visit: Payer: Medicare Other

## 2012-11-06 ENCOUNTER — Ambulatory Visit
Admission: RE | Admit: 2012-11-06 | Discharge: 2012-11-06 | Disposition: A | Discharge status: Home / Self Care | Payer: Medicare Other | Source: Ambulatory Visit | Attending: Internal Medicine | Admitting: Internal Medicine

## 2012-11-06 DIAGNOSIS — Z1231 Encounter for screening mammogram for malignant neoplasm of breast: Secondary | ICD-10-CM

## 2012-11-21 IMAGING — CT CT CTA ABD/PEL W/CM AND/OR W/O CM
4 of 12 series · 14 of 46 positions shown, 18 images · IV contrast (omnipaque)
Comparison: MR 02/03/2012

CLINICAL DATA: Chronic abdominal pain, nausea

CT ANGIOGRAPHY ABDOMEN AND PELVIS
TECHNIQUE: Multidetector CT imaging of the abdomen and pelvis was
performed using the standard protocol during bolus administration
of intravenous contrast.  Multiplanar reconstructed images
including MIPs were obtained and reviewed to evaluate the vascular
anatomy.
Contrast: 100mL OMNIPAQUE IOHEXOL 350 MG/ML SOLN, 60mL OMNIPAQUE
IOHEXOL 350 MG/ML SOLN

[Series 6: arterial · axial · arterial · 0.68mm/px · z∈[-431,-79]mm · 8 of 216 slices shown (1 of 2)]
[im 20/216  soft-tissue]
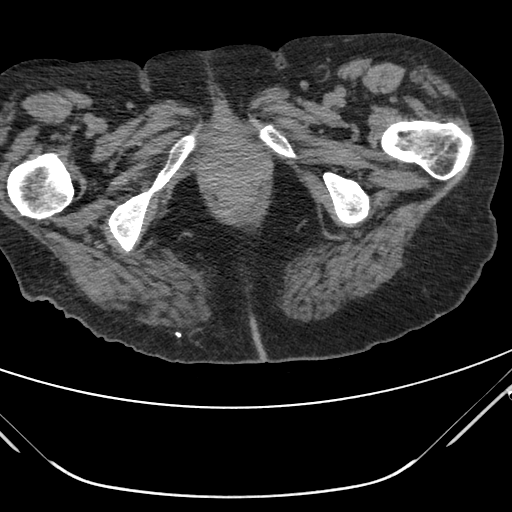
[im 40/216  soft-tissue]
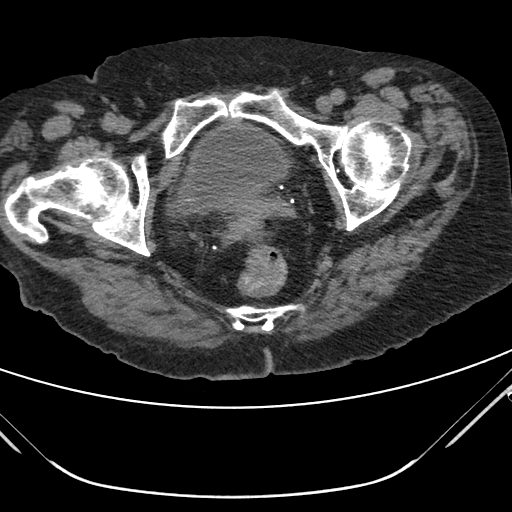
[im 79/216  soft-tissue]
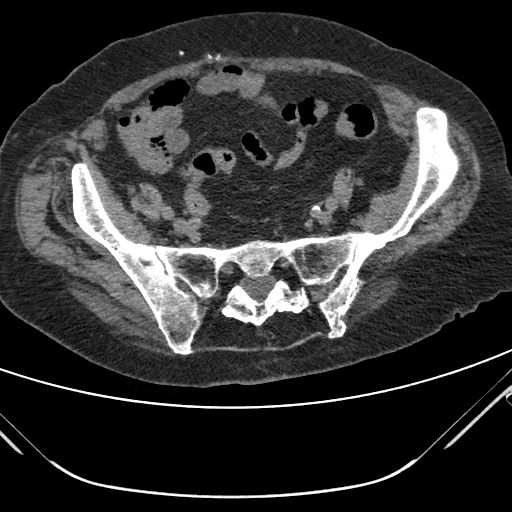
[im 98/216  soft-tissue]
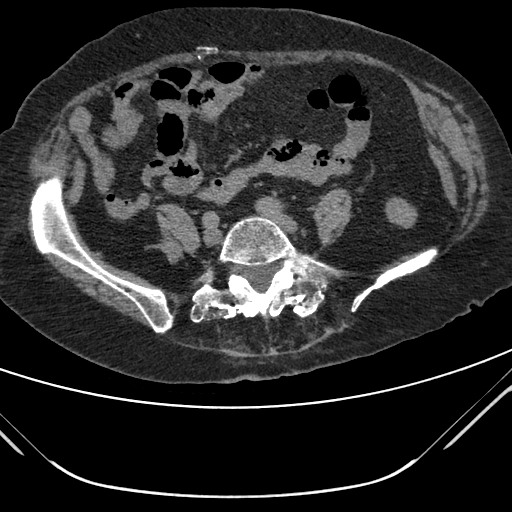
[im 118/216  soft-tissue]
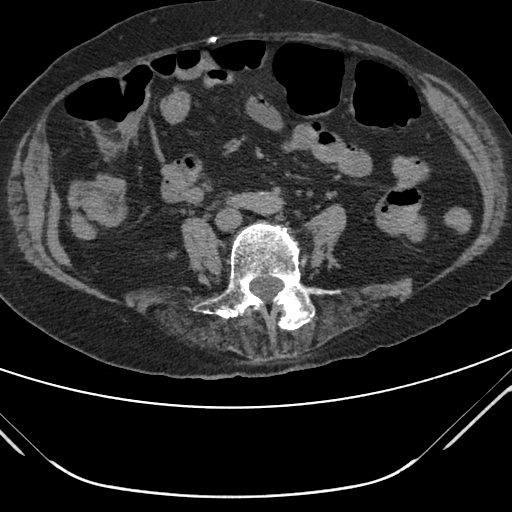
[im 137/216  soft-tissue]
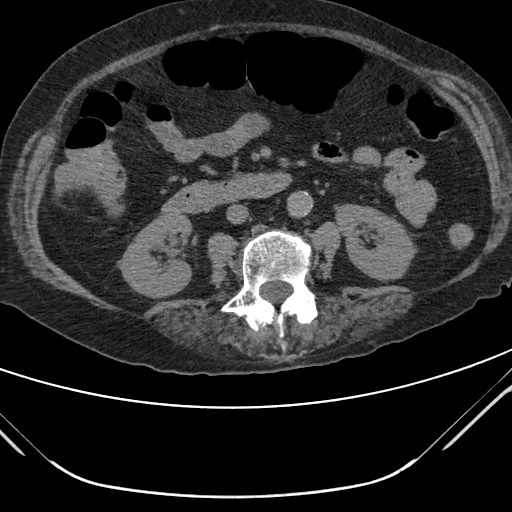
[im 176/216  soft-tissue]
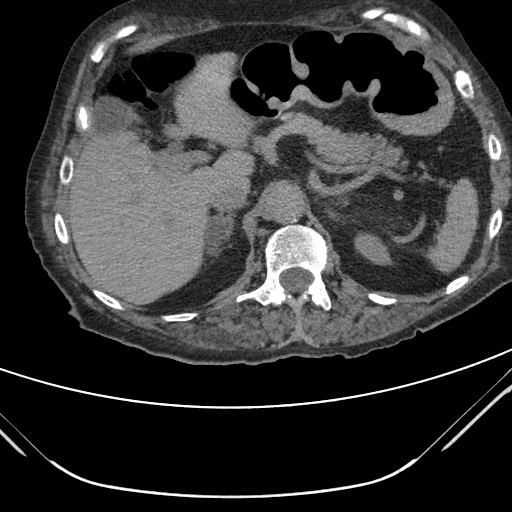
[im 196/216  soft-tissue]
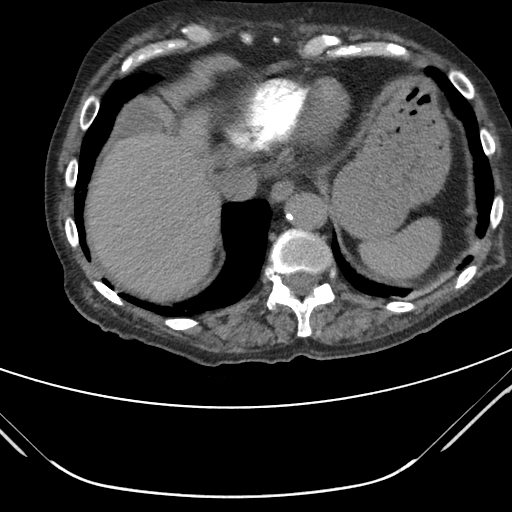

[Series 7: venous · axial · portal-venous · 0.68mm/px · z∈[-329,-184]mm · 2 of 87 slices shown, 5 images]
[im 29/87  soft-tissue]
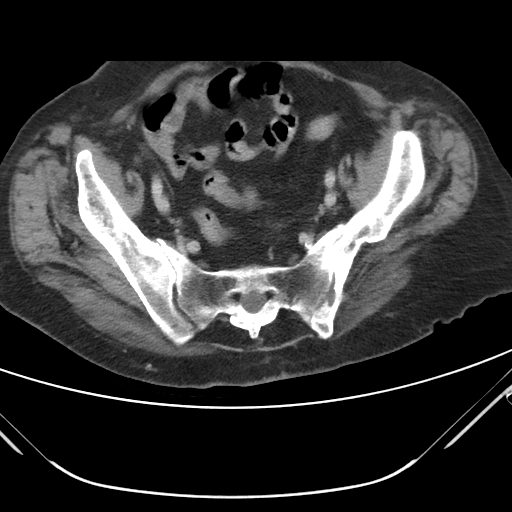
[im 29/87  lung]
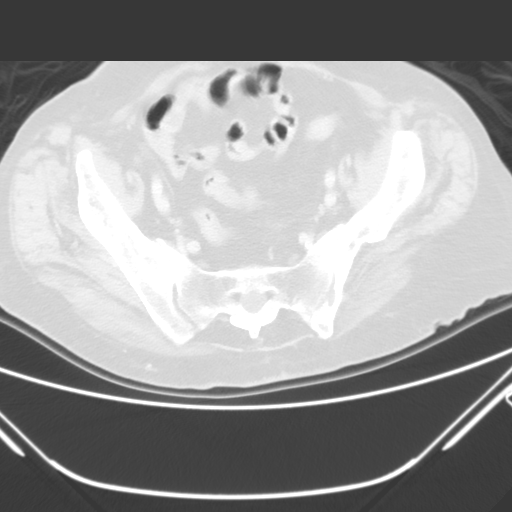
[im 29/87  bone]
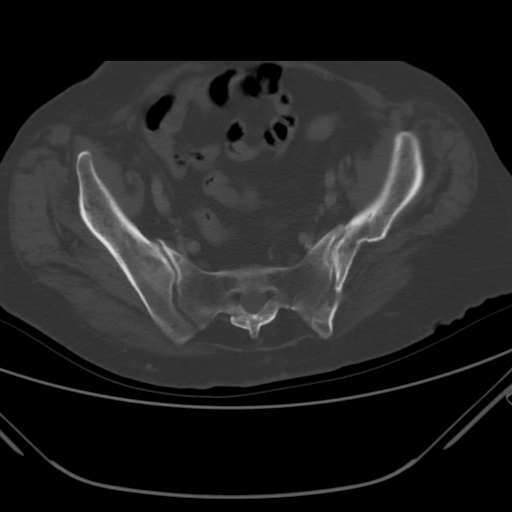
[im 58/87  soft-tissue]
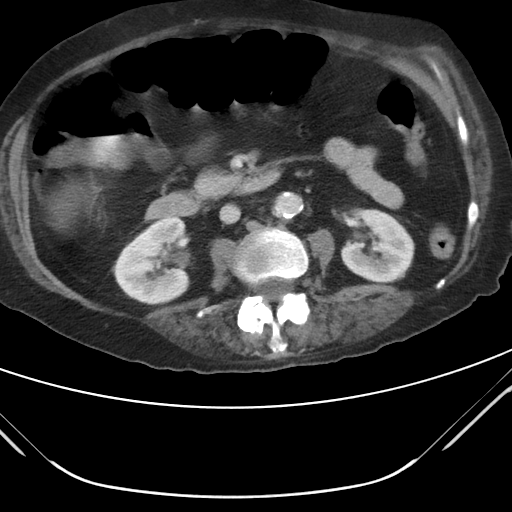
[im 58/87  lung]
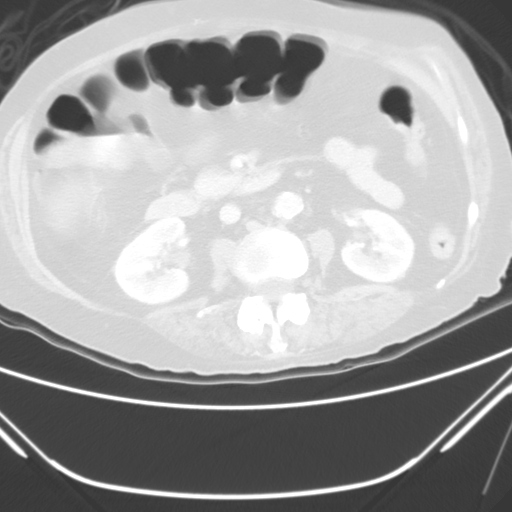

[Series 13: arterial · axial · arterial · 0.68mm/px · z∈[-429,-359]mm · 2 of 210 slices shown (2 of 2)]
[im 18/210  soft-tissue]
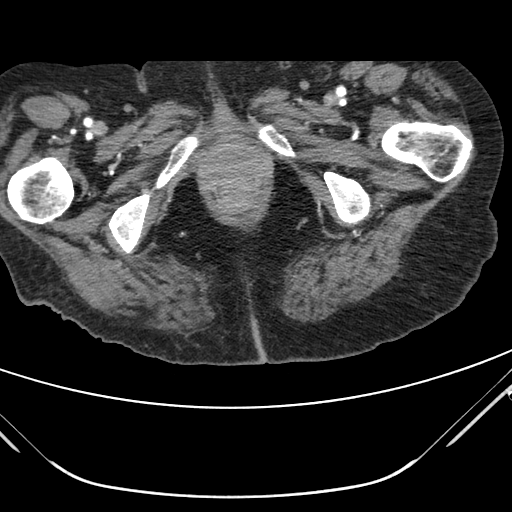
[im 53/210  soft-tissue]
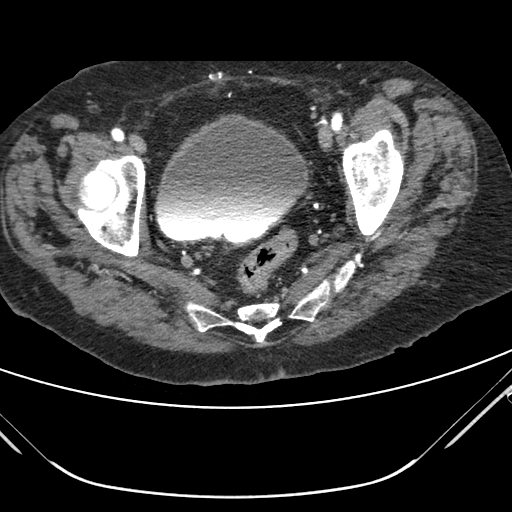

[venous coronal · coronal · portal-venous · 0.84mm/px · 2 of 121 slices shown, 3 images]
[im 41/121  soft-tissue]
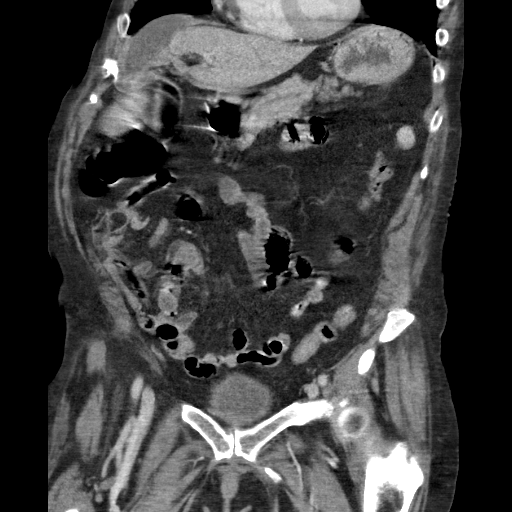
[im 41/121  bone]
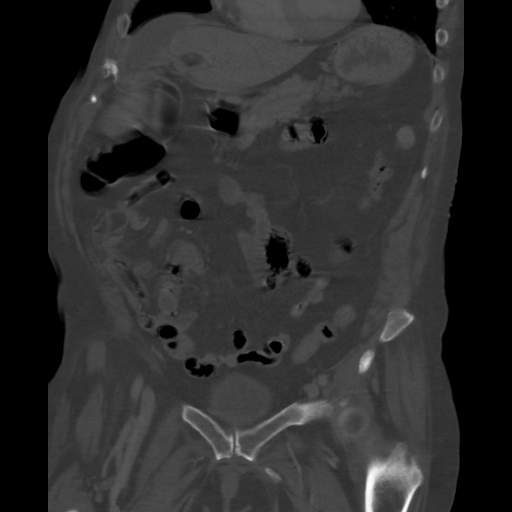
[im 81/121  soft-tissue]
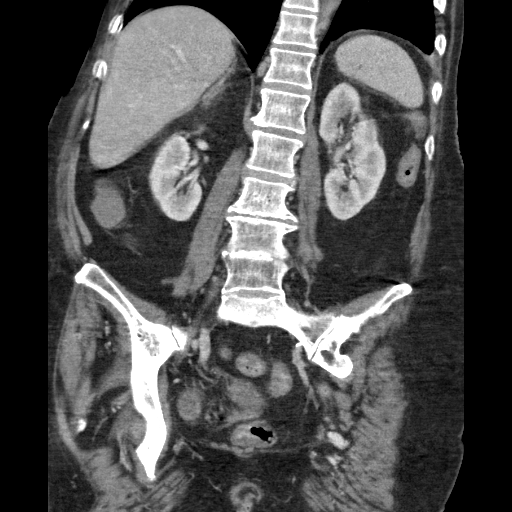

[14 of 46 positions shown; findings below may reference images not displayed]

Arterial findings:
Aorta:                  Ectatic with  moderate scattered calcified
plaque.  There is     eccentric mural thrombus in the infrarenal
segment.  No dissection, aneurysm, or stenosis.

Celiac axis:            Widely patent.

Superior mesenteric:Widely patent, with classic distal branching
anatomy.

Left renal:             Duplicated, inferior moiety dominant, both
widely patent.

Right renal:            Single, with partially calcified ostial
nonocclusive plaque, widely patent distally.  There is a subtle
beading in the distal right main renal artery without definite
intraluminal webs, aneurysm, or stenosis.

Inferior mesenteric:Widely patent

Left iliac:             Mildly tortuous, with minimal nonocclusive
plaque.  No dissection, aneurysm, or stenosis.

Right iliac:            Mildly tortuous with minimal plaque, no
dissection, aneurysm, or stenosis.

Venous findings:  Patent hepatic veins, portal vein, superior
mesenteric vein, splenic vein, bilateral renal veins, IVC, and
iliac venous system.

 Review of the MIP images confirms the above findings.

Nonvascular findings: Visualized lung bases clear. Aberrant
position of the gallbladder anterior to the right hepatic lobe.
Unremarkable liver, spleen, pancreas, kidneys.  There is bilateral
bilateral adrenal hypertrophy, right greater than left.  Common
bile duct is prominent, measuring up to 10 mm diameter, seen down
to the level of the ampulla.  There is also pancreatic duct
dilatation to 3 mm through the head and body, also seen down to the
level of the duodenum.  Stomach and small bowel are nondilated.
Lipomatous ileocecal valve.  Appendix not discretely identified.
The colon is nondilated with a few scattered sigmoid diverticula.
No ascites.  No free air.  No mesenteric, retroperitoneal, or
pelvic adenopathy.  Urinary bladder is incompletely distended.
Bilateral pelvic phleboliths.  Previous hysterectomy.  Degenerative
and postoperative changes in the lumbar spine.
IMPRESSION: 1.  No significant proximal visceral arterial occlusive disease to
suggest an etiology of occlusive mesenteric ischemia.
2.  Possible fibromuscular dysplasia (FMD) in the distal right
renal artery.  Correlate with any clinical or laboratory evidence
of renovascular hypertension or renal dysfunction.
3.  Extrahepatic biliary ductal dilatation and pancreatic ductal
dilatation as previously described.

## 2013-10-31 ENCOUNTER — Encounter (HOSPITAL_COMMUNITY): Payer: Self-pay | Admitting: Emergency Medicine

## 2013-10-31 ENCOUNTER — Emergency Department (HOSPITAL_COMMUNITY)
Admission: EM | Admit: 2013-10-31 | Discharge: 2013-11-01 | Disposition: A | Discharge status: Home / Self Care | Payer: Medicare Other | Attending: Emergency Medicine | Admitting: Emergency Medicine

## 2013-10-31 ENCOUNTER — Emergency Department (HOSPITAL_COMMUNITY): Payer: Medicare Other

## 2013-10-31 DIAGNOSIS — Z8639 Personal history of other endocrine, nutritional and metabolic disease: Secondary | ICD-10-CM | POA: Insufficient documentation

## 2013-10-31 DIAGNOSIS — Z792 Long term (current) use of antibiotics: Secondary | ICD-10-CM | POA: Insufficient documentation

## 2013-10-31 DIAGNOSIS — R109 Unspecified abdominal pain: Secondary | ICD-10-CM

## 2013-10-31 DIAGNOSIS — K5900 Constipation, unspecified: Secondary | ICD-10-CM | POA: Insufficient documentation

## 2013-10-31 DIAGNOSIS — R143 Flatulence: Secondary | ICD-10-CM

## 2013-10-31 DIAGNOSIS — R05 Cough: Secondary | ICD-10-CM | POA: Insufficient documentation

## 2013-10-31 DIAGNOSIS — J4489 Other specified chronic obstructive pulmonary disease: Secondary | ICD-10-CM | POA: Insufficient documentation

## 2013-10-31 DIAGNOSIS — IMO0002 Reserved for concepts with insufficient information to code with codable children: Secondary | ICD-10-CM | POA: Insufficient documentation

## 2013-10-31 DIAGNOSIS — Z79899 Other long term (current) drug therapy: Secondary | ICD-10-CM | POA: Insufficient documentation

## 2013-10-31 DIAGNOSIS — Z8739 Personal history of other diseases of the musculoskeletal system and connective tissue: Secondary | ICD-10-CM | POA: Insufficient documentation

## 2013-10-31 DIAGNOSIS — N390 Urinary tract infection, site not specified: Secondary | ICD-10-CM

## 2013-10-31 DIAGNOSIS — Z853 Personal history of malignant neoplasm of breast: Secondary | ICD-10-CM | POA: Insufficient documentation

## 2013-10-31 DIAGNOSIS — K219 Gastro-esophageal reflux disease without esophagitis: Secondary | ICD-10-CM | POA: Insufficient documentation

## 2013-10-31 DIAGNOSIS — R110 Nausea: Secondary | ICD-10-CM | POA: Insufficient documentation

## 2013-10-31 DIAGNOSIS — Z8659 Personal history of other mental and behavioral disorders: Secondary | ICD-10-CM | POA: Insufficient documentation

## 2013-10-31 DIAGNOSIS — Z872 Personal history of diseases of the skin and subcutaneous tissue: Secondary | ICD-10-CM | POA: Insufficient documentation

## 2013-10-31 DIAGNOSIS — R059 Cough, unspecified: Secondary | ICD-10-CM | POA: Insufficient documentation

## 2013-10-31 DIAGNOSIS — Z862 Personal history of diseases of the blood and blood-forming organs and certain disorders involving the immune mechanism: Secondary | ICD-10-CM | POA: Insufficient documentation

## 2013-10-31 DIAGNOSIS — I2510 Atherosclerotic heart disease of native coronary artery without angina pectoris: Secondary | ICD-10-CM | POA: Insufficient documentation

## 2013-10-31 DIAGNOSIS — J449 Chronic obstructive pulmonary disease, unspecified: Secondary | ICD-10-CM | POA: Insufficient documentation

## 2013-10-31 DIAGNOSIS — R141 Gas pain: Secondary | ICD-10-CM | POA: Insufficient documentation

## 2013-10-31 DIAGNOSIS — F17200 Nicotine dependence, unspecified, uncomplicated: Secondary | ICD-10-CM | POA: Insufficient documentation

## 2013-10-31 DIAGNOSIS — R142 Eructation: Secondary | ICD-10-CM

## 2013-10-31 DIAGNOSIS — Z9889 Other specified postprocedural states: Secondary | ICD-10-CM | POA: Insufficient documentation

## 2013-10-31 DIAGNOSIS — Z90710 Acquired absence of both cervix and uterus: Secondary | ICD-10-CM | POA: Insufficient documentation

## 2013-10-31 LAB — COMPREHENSIVE METABOLIC PANEL
ALT: 7 U/L (ref 0–35)
AST: 14 U/L (ref 0–37)
Albumin: 3.8 g/dL (ref 3.5–5.2)
Alkaline Phosphatase: 68 U/L (ref 39–117)
BUN: 15 mg/dL (ref 6–23)
CALCIUM: 9.9 mg/dL (ref 8.4–10.5)
CO2: 26 meq/L (ref 19–32)
Chloride: 97 mEq/L (ref 96–112)
Creatinine, Ser: 0.64 mg/dL (ref 0.50–1.10)
GFR calc Af Amer: >90 mL/min (ref >90)
GFR calc non Af Amer: 82 mL/min — ABNORMAL LOW (ref >90)
Glucose, Bld: 104 mg/dL — ABNORMAL HIGH (ref 70–99)
Potassium: 4.3 mEq/L (ref 3.7–5.3)
SODIUM: 137 meq/L (ref 137–147)
TOTAL PROTEIN: 6.6 g/dL (ref 6.0–8.3)
Total Bilirubin: 0.4 mg/dL (ref 0.3–1.2)

## 2013-10-31 LAB — CBC
HCT: 34.9 % — ABNORMAL LOW (ref 36.0–46.0)
Hemoglobin: 11.4 g/dL — ABNORMAL LOW (ref 12.0–15.0)
MCH: 32.9 pg (ref 26.0–34.0)
MCHC: 32.7 g/dL (ref 30.0–36.0)
MCV: 100.6 fL — AB (ref 78.0–100.0)
PLATELETS: 163 10*3/uL (ref 150–400)
RBC: 3.47 MIL/uL — ABNORMAL LOW (ref 3.87–5.11)
RDW: 12.8 % (ref 11.5–15.5)
WBC: 7 10*3/uL (ref 4.0–10.5)

## 2013-10-31 LAB — I-STAT CG4 LACTIC ACID, ED: LACTIC ACID, VENOUS: 0.56 mmol/L (ref 0.5–2.2)

## 2013-10-31 LAB — LIPASE, BLOOD: Lipase: 45 U/L (ref 11–59)

## 2013-10-31 MED ORDER — IOHEXOL 300 MG/ML  SOLN
100.0000 mL | Freq: Once | INTRAMUSCULAR | Status: AC | PRN
  Administered 2013-10-31: 100 mL via INTRAVENOUS

## 2013-10-31 MED ORDER — MORPHINE SULFATE 4 MG/ML IJ SOLN
4.0000 mg | Freq: Once | INTRAMUSCULAR | Status: AC
  Administered 2013-10-31: 4 mg via INTRAVENOUS
  Filled 2013-10-31: qty 1

## 2013-10-31 MED ORDER — SODIUM CHLORIDE 0.9 % IV BOLUS (SEPSIS)
1000.0000 mL | Freq: Once | INTRAVENOUS | Status: AC
  Administered 2013-10-31: 1000 mL via INTRAVENOUS

## 2013-10-31 MED ORDER — ONDANSETRON HCL 4 MG/2ML IJ SOLN
4.0000 mg | Freq: Once | INTRAMUSCULAR | Status: AC
  Administered 2013-10-31: 4 mg via INTRAVENOUS
  Filled 2013-10-31: qty 2

## 2013-10-31 NOTE — ED Provider Notes (Signed)
CSN: [id number]     Arrival date & time 10/31/13  1919 History   First MD Initiated Contact with Patient 10/31/13 2003     Chief Complaint  Patient presents with  . Weakness     (Consider location/radiation/quality/duration/timing/severity/associated sxs/prior Treatment) HPI Comments: 80 year old female with a past medical history of chronic respiratory failure, COPD, CAD, GERD, breast cancer, fibromyalgia, depression, adrenal adenoma and anxiety presents to the emergency department with her husband and son via EMS from home complaining of generalized weakness and abdominal pain x1 week, worsening today. Husband states patient suffers from chronic urinary tract infections, was seen by Dr. Janice Norrie 2 days ago and started on keflex. Denies increased urinary frequency, urgency or dysuria at this time. Today while she was sitting at home she developed constant, severe upper abdominal pain and nausea. Her abdomen feels bloated. She has a normal appetite and was able to eat a hamburger for lunch. She did not have a bowel movement for the past couple of days and has not been passing gas. Denies vomiting, fever, chills, confusion, mental status change, chest pain or shortness of breath. Admits to a cough which is chronic and unchanged. History of appendectomy and abdominal hysterectomy for abdominal surgeries.  Patient is a 80 y.o. female presenting with weakness. The history is provided by the patient, the spouse and a relative.  Weakness Associated symptoms include abdominal pain, nausea and weakness.    Past Medical History  Diagnosis Date  . Anxiety   . Smoking   . Fibromyalgia   . Right arm cellulitis   . Depression   . Hyperlipidemia   . DJD (degenerative joint disease)   . Hypercalcemia   . Adrenal adenoma     Stable as of 05/2012 CT scan  . Chronic respiratory failure   . COPD (chronic obstructive pulmonary disease)     some o2 prn at night  . Coronary artery disease   . GERD  (gastroesophageal reflux disease)   . Breast CA 1970    rt breast   Past Surgical History  Procedure Laterality Date  . Breast surgery    . Abdominal hysterectomy    . Appendectomy    . Mastectomy, radical  1970    Right  . Squamous cell carcinoma excision      Floor of mouth  . Colonoscopy w/ biopsies and polypectomy    . Nasal sinus surgery  1994  . Back surgery      Fusion, lumbar  . Eus  04/03/2012    Procedure: UPPER ENDOSCOPIC ULTRASOUND (EUS) LINEAR;  Surgeon: Milus Banister, MD;  Location: [facility];  Service: Endoscopy;  Laterality: N/A;  radial linear   Family History  Problem Relation Age of Onset  . Colon cancer Brother   . Heart disease Father    History  Substance Use Topics  . Smoking status: Current Every Day Smoker -- 2.00 packs/day for 40 years    Types: Cigarettes  . Smokeless tobacco: Never Used  . Alcohol Use: No     Comment: stopped for 1 week   OB History   Grav Para Term Preterm Abortions TAB SAB Ect Mult Living                 Review of Systems  Gastrointestinal: Positive for nausea, abdominal pain, constipation and abdominal distention.  Neurological: Positive for weakness.  All other systems reviewed and are negative.     Allergies  Review of patient's allergies indicates no known allergies.  Home Medications   Prior to Admission medications   Medication Sig Start Date End Date Taking? Authorizing Provider  albuterol (PROVENTIL HFA;VENTOLIN HFA) 108 (90 BASE) MCG/ACT inhaler Inhale 2 puffs into the lungs every 4 (four) hours as needed. Shortness of breath     Historical Provider, MD  azelastine (OPTIVAR) 0.05 % ophthalmic solution 1 drop as needed.    Historical Provider, MD  dicyclomine (BENTYL) 20 MG tablet Take 1 tablet (20 mg total) by mouth 3 (three) times daily between meals as needed (stomach pain). 02/05/12 02/04/13  Wenda Low, MD  ergocalciferol (VITAMIN D2) 50000 UNITS capsule Take 50,000 Units by mouth every 14  (fourteen) days.     Historical Provider, MD  feeding supplement (ENSURE COMPLETE) LIQD Take 237 mLs by mouth 2 (two) times daily between meals. 03/09/12   Wenda Low, MD  fluticasone (FLONASE) 50 MCG/ACT nasal spray Place 2 sprays into the nose daily as needed. For allergies     Historical Provider, MD  furosemide (LASIX) 40 MG tablet Take 40 mg by mouth daily.    Historical Provider, MD  Multiple Vitamin (MULTIVITAMIN WITH MINERALS) TABS Take 1 tablet by mouth daily. 03/09/12   Wenda Low, MD  omeprazole (PRILOSEC) 20 MG capsule Take 20 mg by mouth daily.      Historical Provider, MD  polyethylene glycol (MIRALAX / GLYCOLAX) packet Take 17 g by mouth daily.    Historical Provider, MD  potassium chloride (K-DUR,KLOR-CON) 10 MEQ tablet Take 20 mEq by mouth 2 (two) times daily.    Historical Provider, MD  pregabalin (LYRICA) 75 MG capsule Take 1 capsule (75 mg total) by mouth 2 (two) times daily. 03/10/12   Wenda Low, MD  tiotropium (SPIRIVA) 18 MCG inhalation capsule Place 18 mcg into inhaler and inhale daily.      Historical Provider, MD  tiZANidine (ZANAFLEX) 4 MG capsule Take 4 mg by mouth at bedtime.      Historical Provider, MD  triamcinolone cream (KENALOG) 0.1 % Apply 1 application topically 2 (two) times daily as needed. Swelling in right arm.    Historical Provider, MD  trimethoprim (TRIMPEX) 100 MG tablet Take 100 mg by mouth daily.    Historical Provider, MD  verapamil (CALAN-SR) 120 MG CR tablet Take 1 tablet (120 mg total) by mouth daily. 03/09/12 03/09/13  Wenda Low, MD   BP 127/64  Pulse 73  Resp 15  SpO2 96% Physical Exam  Nursing note and vitals reviewed. Constitutional: She is oriented to person, place, and time. She appears well-developed and well-nourished. No distress.  HENT:  Head: Normocephalic and atraumatic.  Mouth/Throat: Oropharynx is clear and moist.  Eyes: Conjunctivae and EOM are normal. Pupils are equal, round, and reactive to light. No scleral  icterus.  Neck: Normal range of motion. Neck supple.  Cardiovascular: Normal rate, regular rhythm, normal heart sounds and intact distal pulses.   Pulmonary/Chest: Effort normal. No respiratory distress.  Minimal end-expiratory wheezes.  Abdominal: Soft. Bowel sounds are normal. She exhibits distension. There is tenderness. There is no rigidity, no rebound and no guarding.  Hyperresonant to percussion. Generalized tenderness, worse upper abdominal quadrants. No peritoneal signs.  Musculoskeletal: Normal range of motion. She exhibits no edema.  Neurological: She is alert and oriented to person, place, and time.  Skin: Skin is warm and dry. She is not diaphoretic.  Psychiatric: She has a normal mood and affect. Her behavior is normal.    ED Course  Procedures (including critical care time) Labs Review Labs  Reviewed  CBC - Abnormal; Notable for the following:    RBC 3.47 (*)    Hemoglobin 11.4 (*)    HCT 34.9 (*)    MCV 100.6 (*)    All other components within normal limits  COMPREHENSIVE METABOLIC PANEL - Abnormal; Notable for the following:    Glucose, Bld 104 (*)    GFR calc non Af Amer 82 (*)    All other components within normal limits  URINALYSIS, ROUTINE W REFLEX MICROSCOPIC - Abnormal; Notable for the following:    APPearance CLOUDY (*)    Leukocytes, UA LARGE (*)    All other components within normal limits  URINE MICROSCOPIC-ADD ON - Abnormal; Notable for the following:    Squamous Epithelial / LPF FEW (*)    Bacteria, UA MANY (*)    All other components within normal limits  URINE CULTURE  LIPASE, BLOOD  I-STAT CG4 LACTIC ACID, ED    Imaging Review Ct Abdomen Pelvis W Contrast  10/31/2013   CLINICAL DATA:  Generalized weakness and abdominal pain.  Nausea.  EXAM: CT ABDOMEN AND PELVIS WITH CONTRAST  TECHNIQUE: Multidetector CT imaging of the abdomen and pelvis was performed using the standard protocol following bolus administration of intravenous contrast.  CONTRAST:   118mL OMNIPAQUE IOHEXOL 300 MG/ML  SOLN  COMPARISON:  03/06/2012  FINDINGS: BODY WALL: Ventral abdominal wall laxity.  LOWER CHEST: No consolidation.  ABDOMEN/PELVIS:  Liver: No focal abnormality.  Biliary: Chronic intra and extrahepatic biliary ductal dilatation. No calcified choledocholithiasis.  Pancreas: Ventral and dorsal pancreatic duct enlargement, similar to MRCP 02/03/2012. There is pancreas divisum. No acute pancreatitis by imaging.  Spleen: Unremarkable.  Adrenals: As expected, size stable adenoma in the right adrenal gland, 15 mm diameter.  Kidneys and ureters: No hydronephrosis. Punctate calculus in the lower pole left kidney. No evidence of solid mass. Mild left upper pole renal cortical scarring.  Bladder: Moderate distention. Diverticulum from the right posterior bladder wall and from the apex.Low positioning of the bladder base consistent with pelvic floor laxity, which may result in chronic bladder outlet obstruction causing the previously noted diverticula.  Reproductive: Hysterectomy.  Bowel: Moderate volume of formed stool throughout the colon. No bowel obstruction or inflammation. No pericecal inflammation.  Retroperitoneum: No mass or adenopathy.  Peritoneum: No free fluid or gas.  Vascular: Aortic and branch vessel atherosclerosis. No major vessel occlusion. No appreciable change compared to mesenteric angiogram in 2013.  OSSEOUS: Osteopenia. No acute fracture. Posterior lumbar spine fusion from L4-S1, with bone harvest site in the left iliac wing.  IMPRESSION: 1. No acute intra-abdominal findings. 2. Chronic findings are stable from 2013, described above.   Electronically Signed   By: Jorje Guild M.D.   On: 10/31/2013 23:00     EKG Interpretation   Date/Time:  Saturday Oct 31 2013 19:24:34 EDT Ventricular Rate:  69 PR Interval:  172 QRS Duration: 108 QT Interval:  386 QTC Calculation: 413 R Axis:   -60 Text Interpretation:  Sinus rhythm LAD, consider left anterior  fascicular  block Abnormal R-wave progression, early transition LVH with secondary  repolarization abnormality Anterior Q waves, possibly due to LVH Since  last tracing rate slower Confirmed by KNAPP  MD-I, IVA ([id number]) on 10/31/2013  8:27:38 PM      MDM   Final diagnoses:  UTI (urinary tract infection)  Abdominal pain    Patient presenting with weakness, abdominal pain, abdominal distention and nausea. She appears in no apparent distress. Vital signs stable. Labs pending.  Concern for possible small bowel obstruction. CT with contrast pending. Regarding wheezes on exam, patient states this is chronic.  12:45 AM UA positive for infection, too numerous to count white blood cells, many bacteria, large leukocytes, nitrite negative. Will discharge patient with Bactrim rather than Keflex. She was Bactrim over her Cipro due to poor interaction of Cipro and tizanidine. Urine culture pending. Patient's pain has not returned. Will also sent home with a short course of Vicodin. Followup with Dr. Janice Norrie. Stable for d/c. Return precautions given. Patient and family state understanding of treatment care plan and are agreeable.  Case discussed with attending Dr. Sabra Heck who also evaluated patient and agrees with plan of care.   Illene Labrador, PA-C 11/01/13 [phone]

## 2013-10-31 NOTE — ED Notes (Signed)
Pt ED via EMS with c/o generalize weakness and abdominal pain associated with nausea, onset about a weak.

## 2013-10-31 NOTE — ED Provider Notes (Signed)
The patient is an 80 year old female, history of appendectomy, presents with one day of abdominal pain with associated distention and nausea. There is no vomiting.  On exam the patient has a soft distended and tympanitic to percussion abdomen. There is mild guarding but no rebound or peritoneal signs. Her lungs and heart has no signs of murmur or distress. The patient does reek of tobacco and has a cough at baseline. She has no peripheral edema. She does not like to lay flat as this increases the pain.  The patient will need further workup with CT scan in nature that she did not have a bowel obstruction or other intra-abdominal surgical problem. Pain medication nausea medication, lactic acid and other labs pending.  Medical screening examination/treatment/procedure(s) were conducted as a shared visit with non-physician practitioner(s) and myself.  I personally evaluated the patient during the encounter.  Clinical Impression: Abdominal pain      Johnna Acosta, MD 11/01/13 [phone]

## 2013-10-31 NOTE — ED Notes (Signed)
Patient returned from CT

## 2013-11-01 LAB — URINALYSIS, ROUTINE W REFLEX MICROSCOPIC
Bilirubin Urine: NEGATIVE
Glucose, UA: NEGATIVE mg/dL
HGB URINE DIPSTICK: NEGATIVE
Ketones, ur: NEGATIVE mg/dL
NITRITE: NEGATIVE
Protein, ur: NEGATIVE mg/dL
SPECIFIC GRAVITY, URINE: 1.025 (ref 1.005–1.030)
Urobilinogen, UA: 0.2 mg/dL (ref 0.0–1.0)
pH: 6.5 (ref 5.0–8.0)

## 2013-11-01 LAB — URINE MICROSCOPIC-ADD ON

## 2013-11-01 MED ORDER — ACETAMINOPHEN 325 MG PO TABS
650.0000 mg | ORAL_TABLET | Freq: Once | ORAL | Status: AC
  Administered 2013-11-01: 650 mg via ORAL
  Filled 2013-11-01: qty 2

## 2013-11-01 MED ORDER — HYDROCODONE-ACETAMINOPHEN 5-325 MG PO TABS: 1.0000 | ORAL_TABLET | ORAL | Status: DC | PRN

## 2013-11-01 MED ORDER — SULFAMETHOXAZOLE-TMP DS 800-160 MG PO TABS: 1.0000 | ORAL_TABLET | Freq: Two times a day (BID) | ORAL | Status: DC

## 2013-11-01 NOTE — Discharge Instructions (Signed)
Take antibiotic to completion for the next 5 days. Follow up with your primary care doctor. Take vicodin for severe pain only.  Abdominal Pain, Adult Many things can cause abdominal pain. Usually, abdominal pain is not caused by a disease and will improve without treatment. It can often be observed and treated at home. Your health care provider will do a physical exam and possibly order blood tests and X-rays to help determine the seriousness of your pain. However, in many cases, more time must pass before a clear cause of the pain can be found. Before that point, your health care provider may not know if you need more testing or further treatment. HOME CARE INSTRUCTIONS  Monitor your abdominal pain for any changes. The following actions may help to alleviate any discomfort you are experiencing:  Only take over-the-counter or prescription medicines as directed by your health care provider.  Do not take laxatives unless directed to do so by your health care provider.  Try a clear liquid diet (broth, tea, or water) as directed by your health care provider. Slowly move to a bland diet as tolerated. SEEK MEDICAL CARE IF:  You have unexplained abdominal pain.  You have abdominal pain associated with nausea or diarrhea.  You have pain when you urinate or have a bowel movement.  You experience abdominal pain that wakes you in the night.  You have abdominal pain that is worsened or improved by eating food.  You have abdominal pain that is worsened with eating fatty foods. SEEK IMMEDIATE MEDICAL CARE IF:   Your pain does not go away within 2 hours.  You have a fever.  You keep throwing up (vomiting).  Your pain is felt only in portions of the abdomen, such as the right side or the left lower portion of the abdomen.  You pass bloody or black tarry stools. MAKE SURE YOU:  Understand these instructions.   Will watch your condition.   Will get help right away if you are not doing well  or get worse.  Document Released: 03/21/2005 Document Revised: 04/01/2013 Document Reviewed: 02/18/2013 [facility] Patient Information 2014 [place].  Urinary Tract Infection Urinary tract infections (UTIs) can develop anywhere along your urinary tract. Your urinary tract is your body's drainage system for removing wastes and extra water. Your urinary tract includes two kidneys, two ureters, a bladder, and a urethra. Your kidneys are a pair of bean-shaped organs. Each kidney is about the size of your fist. They are located below your ribs, one on each side of your spine. CAUSES Infections are caused by microbes, which are microscopic organisms, including fungi, viruses, and bacteria. These organisms are so small that they can only be seen through a microscope. Bacteria are the microbes that most commonly cause UTIs. SYMPTOMS  Symptoms of UTIs may vary by age and gender of the patient and by the location of the infection. Symptoms in young women typically include a frequent and intense urge to urinate and a painful, burning feeling in the bladder or urethra during urination. Older women and men are more likely to be tired, shaky, and weak and have muscle aches and abdominal pain. A fever may mean the infection is in your kidneys. Other symptoms of a kidney infection include pain in your back or sides below the ribs, nausea, and vomiting. DIAGNOSIS To diagnose a UTI, your caregiver will ask you about your symptoms. Your caregiver also will ask to provide a urine sample. The urine sample will be tested for  bacteria and white blood cells. White blood cells are made by your body to help fight infection. TREATMENT  Typically, UTIs can be treated with medication. Because most UTIs are caused by a bacterial infection, they usually can be treated with the use of antibiotics. The choice of antibiotic and length of treatment depend on your symptoms and the type of bacteria causing your infection. HOME CARE  INSTRUCTIONS  If you were prescribed antibiotics, take them exactly as your caregiver instructs you. Finish the medication even if you feel better after you have only taken some of the medication.  Drink enough water and fluids to keep your urine clear or pale yellow.  Avoid caffeine, tea, and carbonated beverages. They tend to irritate your bladder.  Empty your bladder often. Avoid holding urine for long periods of time.  Empty your bladder before and after sexual intercourse.  After a bowel movement, women should cleanse from front to back. Use each tissue only once. SEEK MEDICAL CARE IF:   You have back pain.  You develop a fever.  Your symptoms do not begin to resolve within 3 days. SEEK IMMEDIATE MEDICAL CARE IF:   You have severe back pain or lower abdominal pain.  You develop chills.  You have nausea or vomiting.  You have continued burning or discomfort with urination. MAKE SURE YOU:   Understand these instructions.  Will watch your condition.  Will get help right away if you are not doing well or get worse. Document Released: 03/21/2005 Document Revised: 12/11/2011 Document Reviewed: 07/20/2011 [facility] Patient Information 2014 [place].

## 2013-11-01 NOTE — ED Provider Notes (Signed)
Medical screening examination/treatment/procedure(s) were conducted as a shared visit with non-physician practitioner(s) and myself.  I personally evaluated the patient during the encounter  Please see my separate respective documentation pertaining to this patient encounter   Johnna Acosta, MD 11/01/13 [phone]

## 2013-11-03 LAB — URINE CULTURE: Colony Count: 35000

## 2013-11-04 ENCOUNTER — Telehealth (HOSPITAL_BASED_OUTPATIENT_CLINIC_OR_DEPARTMENT_OTHER): Payer: Self-pay | Admitting: Emergency Medicine

## 2013-11-04 NOTE — Telephone Encounter (Signed)
Post ED Visit - Positive Culture Follow-up  Culture report reviewed by antimicrobial stewardship pharmacist: []  Wes [place], Pharm.D., BCPS []  Heide Guile, Pharm.D., BCPS []  Alycia Rossetti, Pharm.D., BCPS []  [place], Pharm.D., BCPS, AAHIVP []  Legrand Como, Pharm.D., BCPS, AAHIVP []  Juliene Pina, Pharm.D. [x]  Assunta Curtis, Pharm.D.  Positive urine culture Treated with Keflex, organism sensitive to the same and no further patient follow-up is required at this time.  Myrna Blazer 11/04/2013, 2:48 PM

## 2013-11-11 ENCOUNTER — Inpatient Hospital Stay (HOSPITAL_COMMUNITY): Payer: Medicare Other

## 2013-11-11 ENCOUNTER — Emergency Department (HOSPITAL_COMMUNITY): Payer: Medicare Other

## 2013-11-11 ENCOUNTER — Inpatient Hospital Stay (HOSPITAL_COMMUNITY)
Admission: EM | Admit: 2013-11-11 | Discharge: 2013-11-16 | DRG: 444 | Disposition: A | Discharge status: Skilled Nursing Facility | Payer: Medicare Other | Attending: Internal Medicine | Admitting: Internal Medicine

## 2013-11-11 ENCOUNTER — Encounter (HOSPITAL_COMMUNITY): Payer: Self-pay | Admitting: Emergency Medicine

## 2013-11-11 DIAGNOSIS — F341 Dysthymic disorder: Secondary | ICD-10-CM | POA: Diagnosis present

## 2013-11-11 DIAGNOSIS — IMO0001 Reserved for inherently not codable concepts without codable children: Secondary | ICD-10-CM | POA: Diagnosis present

## 2013-11-11 DIAGNOSIS — Z853 Personal history of malignant neoplasm of breast: Secondary | ICD-10-CM

## 2013-11-11 DIAGNOSIS — R5381 Other malaise: Secondary | ICD-10-CM | POA: Diagnosis present

## 2013-11-11 DIAGNOSIS — J4489 Other specified chronic obstructive pulmonary disease: Secondary | ICD-10-CM | POA: Diagnosis present

## 2013-11-11 DIAGNOSIS — J440 Chronic obstructive pulmonary disease with acute lower respiratory infection: Secondary | ICD-10-CM

## 2013-11-11 DIAGNOSIS — R110 Nausea: Secondary | ICD-10-CM

## 2013-11-11 DIAGNOSIS — I5032 Chronic diastolic (congestive) heart failure: Secondary | ICD-10-CM | POA: Diagnosis present

## 2013-11-11 DIAGNOSIS — G894 Chronic pain syndrome: Secondary | ICD-10-CM | POA: Diagnosis present

## 2013-11-11 DIAGNOSIS — E43 Unspecified severe protein-calorie malnutrition: Secondary | ICD-10-CM | POA: Diagnosis present

## 2013-11-11 DIAGNOSIS — J9610 Chronic respiratory failure, unspecified whether with hypoxia or hypercapnia: Secondary | ICD-10-CM | POA: Diagnosis present

## 2013-11-11 DIAGNOSIS — J449 Chronic obstructive pulmonary disease, unspecified: Secondary | ICD-10-CM | POA: Diagnosis present

## 2013-11-11 DIAGNOSIS — F418 Other specified anxiety disorders: Secondary | ICD-10-CM | POA: Diagnosis present

## 2013-11-11 DIAGNOSIS — M549 Dorsalgia, unspecified: Secondary | ICD-10-CM | POA: Diagnosis present

## 2013-11-11 DIAGNOSIS — I509 Heart failure, unspecified: Secondary | ICD-10-CM

## 2013-11-11 DIAGNOSIS — F17200 Nicotine dependence, unspecified, uncomplicated: Secondary | ICD-10-CM | POA: Diagnosis present

## 2013-11-11 DIAGNOSIS — F419 Anxiety disorder, unspecified: Secondary | ICD-10-CM

## 2013-11-11 DIAGNOSIS — Z79899 Other long term (current) drug therapy: Secondary | ICD-10-CM

## 2013-11-11 DIAGNOSIS — I2510 Atherosclerotic heart disease of native coronary artery without angina pectoris: Secondary | ICD-10-CM | POA: Diagnosis present

## 2013-11-11 DIAGNOSIS — F411 Generalized anxiety disorder: Secondary | ICD-10-CM

## 2013-11-11 DIAGNOSIS — D72829 Elevated white blood cell count, unspecified: Secondary | ICD-10-CM | POA: Diagnosis present

## 2013-11-11 DIAGNOSIS — R1011 Right upper quadrant pain: Secondary | ICD-10-CM

## 2013-11-11 DIAGNOSIS — K810 Acute cholecystitis: Principal | ICD-10-CM | POA: Diagnosis present

## 2013-11-11 DIAGNOSIS — R4181 Age-related cognitive decline: Secondary | ICD-10-CM | POA: Diagnosis present

## 2013-11-11 DIAGNOSIS — J209 Acute bronchitis, unspecified: Secondary | ICD-10-CM

## 2013-11-11 DIAGNOSIS — K219 Gastro-esophageal reflux disease without esophagitis: Secondary | ICD-10-CM | POA: Diagnosis present

## 2013-11-11 DIAGNOSIS — R109 Unspecified abdominal pain: Secondary | ICD-10-CM

## 2013-11-11 DIAGNOSIS — E785 Hyperlipidemia, unspecified: Secondary | ICD-10-CM | POA: Diagnosis present

## 2013-11-11 DIAGNOSIS — N390 Urinary tract infection, site not specified: Secondary | ICD-10-CM | POA: Diagnosis present

## 2013-11-11 HISTORY — DX: Urinary tract infection, site not specified: N39.0

## 2013-11-11 HISTORY — DX: Other chronic pain: G89.29

## 2013-11-11 HISTORY — DX: Postmastectomy lymphedema syndrome: I97.2

## 2013-11-11 HISTORY — DX: Dorsalgia, unspecified: M54.9

## 2013-11-11 HISTORY — DX: Heart failure, unspecified: I50.9

## 2013-11-11 HISTORY — DX: Dependence on supplemental oxygen: Z99.81

## 2013-11-11 HISTORY — DX: Unspecified osteoarthritis, unspecified site: M19.90

## 2013-11-11 LAB — COMPREHENSIVE METABOLIC PANEL
ALBUMIN: 3.5 g/dL (ref 3.5–5.2)
ALT: 15 U/L (ref 0–35)
AST: 27 U/L (ref 0–37)
Alkaline Phosphatase: 66 U/L (ref 39–117)
BUN: 26 mg/dL — ABNORMAL HIGH (ref 6–23)
CALCIUM: 11.2 mg/dL — AB (ref 8.4–10.5)
CO2: 28 mEq/L (ref 19–32)
CREATININE: 0.73 mg/dL (ref 0.50–1.10)
Chloride: 94 mEq/L — ABNORMAL LOW (ref 96–112)
GFR calc Af Amer: >90 mL/min (ref >90)
GFR calc non Af Amer: 79 mL/min — ABNORMAL LOW (ref >90)
Glucose, Bld: 116 mg/dL — ABNORMAL HIGH (ref 70–99)
Potassium: 5.1 mEq/L (ref 3.7–5.3)
Sodium: 133 mEq/L — ABNORMAL LOW (ref 137–147)
TOTAL PROTEIN: 7 g/dL (ref 6.0–8.3)
Total Bilirubin: 0.9 mg/dL (ref 0.3–1.2)

## 2013-11-11 LAB — URINALYSIS, ROUTINE W REFLEX MICROSCOPIC
Bilirubin Urine: NEGATIVE
GLUCOSE, UA: NEGATIVE mg/dL
HGB URINE DIPSTICK: NEGATIVE
KETONES UR: NEGATIVE mg/dL
Nitrite: NEGATIVE
Protein, ur: NEGATIVE mg/dL
Specific Gravity, Urine: 1.018 (ref 1.005–1.030)
Urobilinogen, UA: 0.2 mg/dL (ref 0.0–1.0)
pH: 5.5 (ref 5.0–8.0)

## 2013-11-11 LAB — CBC WITH DIFFERENTIAL/PLATELET
Basophils Absolute: 0 10*3/uL (ref 0.0–0.1)
Basophils Relative: 0 % (ref 0–1)
EOS PCT: 0 % (ref 0–5)
Eosinophils Absolute: 0 10*3/uL (ref 0.0–0.7)
HCT: 36.7 % (ref 36.0–46.0)
HEMOGLOBIN: 12.4 g/dL (ref 12.0–15.0)
LYMPHS ABS: 0.8 10*3/uL (ref 0.7–4.0)
LYMPHS PCT: 6 % — AB (ref 12–46)
MCH: 33.4 pg (ref 26.0–34.0)
MCHC: 33.8 g/dL (ref 30.0–36.0)
MCV: 98.9 fL (ref 78.0–100.0)
MONOS PCT: 8 % (ref 3–12)
Monocytes Absolute: 1.2 10*3/uL — ABNORMAL HIGH (ref 0.1–1.0)
Neutro Abs: 12.8 10*3/uL — ABNORMAL HIGH (ref 1.7–7.7)
Neutrophils Relative %: 86 % — ABNORMAL HIGH (ref 43–77)
Platelets: 205 10*3/uL (ref 150–400)
RBC: 3.71 MIL/uL — ABNORMAL LOW (ref 3.87–5.11)
RDW: 12.4 % (ref 11.5–15.5)
WBC: 14.8 10*3/uL — AB (ref 4.0–10.5)

## 2013-11-11 LAB — URINE MICROSCOPIC-ADD ON

## 2013-11-11 LAB — PROTIME-INR
INR: 1.07 (ref 0.00–1.49)
PROTHROMBIN TIME: 13.7 s (ref 11.6–15.2)

## 2013-11-11 LAB — GLUCOSE, CAPILLARY: Glucose-Capillary: 100 mg/dL — ABNORMAL HIGH (ref 70–99)

## 2013-11-11 LAB — LIPASE, BLOOD: Lipase: 14 U/L (ref 11–59)

## 2013-11-11 MED ORDER — MIDAZOLAM HCL 2 MG/2ML IJ SOLN
INTRAMUSCULAR | Status: AC
  Filled 2013-11-11: qty 2

## 2013-11-11 MED ORDER — FENTANYL 25 MCG/HR TD PT72
25.0000 ug | MEDICATED_PATCH | TRANSDERMAL | Status: DC
  Administered 2013-11-11: 25 ug via TRANSDERMAL
  Filled 2013-11-11: qty 1

## 2013-11-11 MED ORDER — MEPERIDINE HCL 50 MG/ML IJ SOLN
INTRAMUSCULAR | Status: AC
  Filled 2013-11-11: qty 1

## 2013-11-11 MED ORDER — TIOTROPIUM BROMIDE MONOHYDRATE 18 MCG IN CAPS
18.0000 ug | ORAL_CAPSULE | Freq: Every day | RESPIRATORY_TRACT | Status: DC
Start: 2013-11-11 — End: 2013-11-16
  Administered 2013-11-12 – 2013-11-16 (×5): 18 ug via RESPIRATORY_TRACT
  Filled 2013-11-11 (×3): qty 5

## 2013-11-11 MED ORDER — ALBUTEROL SULFATE (2.5 MG/3ML) 0.083% IN NEBU: 2.5000 mg | INHALATION_SOLUTION | Freq: Four times a day (QID) | RESPIRATORY_TRACT | Status: DC | PRN

## 2013-11-11 MED ORDER — NICOTINE 14 MG/24HR TD PT24
14.0000 mg | MEDICATED_PATCH | Freq: Every day | TRANSDERMAL | Status: DC
  Administered 2013-11-11 – 2013-11-16 (×6): 14 mg via TRANSDERMAL
  Filled 2013-11-11 (×6): qty 1

## 2013-11-11 MED ORDER — PIPERACILLIN-TAZOBACTAM 3.375 G IVPB
3.3750 g | Freq: Three times a day (TID) | INTRAVENOUS | Status: DC
Start: 2013-11-11 — End: 2013-11-15
  Administered 2013-11-11 – 2013-11-15 (×12): 3.375 g via INTRAVENOUS
  Filled 2013-11-11 (×14): qty 50

## 2013-11-11 MED ORDER — ACETAMINOPHEN 650 MG RE SUPP: 650.0000 mg | Freq: Four times a day (QID) | RECTAL | Status: DC | PRN

## 2013-11-11 MED ORDER — ONDANSETRON HCL 4 MG/2ML IJ SOLN
4.0000 mg | Freq: Four times a day (QID) | INTRAMUSCULAR | Status: DC | PRN
  Administered 2013-11-13 – 2013-11-15 (×4): 4 mg via INTRAVENOUS
  Filled 2013-11-11 (×4): qty 2

## 2013-11-11 MED ORDER — FENTANYL CITRATE 0.05 MG/ML IJ SOLN
INTRAMUSCULAR | Status: AC
  Filled 2013-11-11: qty 2

## 2013-11-11 MED ORDER — ACETAMINOPHEN 325 MG PO TABS
650.0000 mg | ORAL_TABLET | Freq: Four times a day (QID) | ORAL | Status: DC | PRN
  Administered 2013-11-12 – 2013-11-16 (×6): 650 mg via ORAL
  Filled 2013-11-11 (×6): qty 2

## 2013-11-11 MED ORDER — IOHEXOL 300 MG/ML  SOLN
50.0000 mL | Freq: Once | INTRAMUSCULAR | Status: AC | PRN
  Administered 2013-11-11: 20 mL

## 2013-11-11 MED ORDER — BUDESONIDE 0.25 MG/2ML IN SUSP
0.2500 mg | Freq: Two times a day (BID) | RESPIRATORY_TRACT | Status: DC
Start: 2013-11-11 — End: 2013-11-16
  Administered 2013-11-11 – 2013-11-16 (×10): 0.25 mg via RESPIRATORY_TRACT
  Filled 2013-11-11 (×14): qty 2

## 2013-11-11 MED ORDER — HYDROMORPHONE HCL PF 1 MG/ML IJ SOLN
1.0000 mg | Freq: Once | INTRAMUSCULAR | Status: AC
  Administered 2013-11-11: 1 mg via INTRAVENOUS
  Filled 2013-11-11: qty 1

## 2013-11-11 MED ORDER — FLUTICASONE PROPIONATE 50 MCG/ACT NA SUSP
2.0000 | Freq: Every day | NASAL | Status: DC | PRN
  Filled 2013-11-11: qty 16

## 2013-11-11 MED ORDER — ONDANSETRON HCL 4 MG/2ML IJ SOLN
4.0000 mg | Freq: Once | INTRAMUSCULAR | Status: AC
  Administered 2013-11-11: 4 mg via INTRAVENOUS
  Filled 2013-11-11: qty 2

## 2013-11-11 MED ORDER — IOHEXOL 300 MG/ML  SOLN: 25.0000 mL | INTRAMUSCULAR | Status: AC

## 2013-11-11 MED ORDER — SODIUM CHLORIDE 0.9 % IV SOLN
INTRAVENOUS | Status: DC
  Administered 2013-11-11: 09:00:00 via INTRAVENOUS

## 2013-11-11 MED ORDER — SODIUM CHLORIDE 0.9 % IV SOLN: INTRAVENOUS | Status: DC

## 2013-11-11 MED ORDER — IOHEXOL 300 MG/ML  SOLN
80.0000 mL | Freq: Once | INTRAMUSCULAR | Status: AC | PRN
  Administered 2013-11-11: 80 mL via INTRAVENOUS

## 2013-11-11 MED ORDER — FAMOTIDINE IN NACL 20-0.9 MG/50ML-% IV SOLN
20.0000 mg | INTRAVENOUS | Status: DC
  Administered 2013-11-11: 20 mg via INTRAVENOUS
  Filled 2013-11-11 (×2): qty 50

## 2013-11-11 MED ORDER — ONDANSETRON HCL 4 MG PO TABS
4.0000 mg | ORAL_TABLET | Freq: Four times a day (QID) | ORAL | Status: DC | PRN
  Administered 2013-11-16 (×2): 4 mg via ORAL
  Filled 2013-11-11 (×2): qty 1

## 2013-11-11 MED ORDER — DEXTROSE-NACL 5-0.9 % IV SOLN
INTRAVENOUS | Status: AC
  Administered 2013-11-11: 20:00:00 via INTRAVENOUS

## 2013-11-11 MED ORDER — FENTANYL CITRATE 0.05 MG/ML IJ SOLN
INTRAMUSCULAR | Status: AC | PRN
  Administered 2013-11-11 (×3): 25 ug via INTRAVENOUS

## 2013-11-11 MED ORDER — MORPHINE SULFATE 2 MG/ML IJ SOLN
0.5000 mg | INTRAMUSCULAR | Status: DC | PRN
  Administered 2013-11-12 – 2013-11-15 (×5): 1 mg via INTRAVENOUS
  Filled 2013-11-11 (×5): qty 1

## 2013-11-11 MED ORDER — MIDAZOLAM HCL 2 MG/2ML IJ SOLN
INTRAMUSCULAR | Status: AC | PRN
  Administered 2013-11-11 (×3): 0.5 mg via INTRAVENOUS

## 2013-11-11 NOTE — ED Notes (Signed)
Per EMS: pt coming from home with right upper quadrant abdominal pain. Pt was recently dx with UTI. Pt reports some nausea but no vomiting. Pt A&Ox4, respirations equal and unlabored, skin warm and dry

## 2013-11-11 NOTE — Procedures (Signed)
Successful [place] and fluoroscopic guided placement of 10 Fr cholecystotomy tube.  No immediate complications.

## 2013-11-11 NOTE — Consult Note (Signed)
Theresa Blackwell June 05, 1931  [id number].   Primary Care MD: Dr. Wenda Low Requesting MD: Dr. Fredia Sorrow Chief Complaint/Reason for Consult: acute cholecystitis HPI: This is a frail 80 yo female with chronic respiratory failure/COPD who requires usually around 3L [place] at home.  She continues to smoke about 2 packs of cigarettes a day.  She has multiple other medical problems as well.  Several months ago, she began complaining of nausea particularly postprandial.  No associated pain at that time.  However, a couple of weeks ago, she began having pain.  She went to [facility] where she had a workup that was negative, except for a UTI which she chronically has and is managed by Dr. Janice Norrie.  She was put on abx therapy for this.  Her pain continued to persist.  She wanted to come back to the ED, but chose not to in order to give her abx a chance to work.  Howe today, 11 days from her initial visit, her pain got so bad that they called EMS to bring her back.  She has a WBC of 14K today with normal LFTS.  She does, however, have a CT scan that shows significant inflammation of her gallbladder with wall thickness up to 2cm.  There is surrounding ascites and likely some gangrenous areas of her gallbladder as well.  Her colon is also markedly dilated, like reactive to this process in the RUQ.  We have been asked to see her for further recommendations.  ROS: please see HPI, otherwise unable to obtain more systems given patient's sedated state.  She only c/o horrible abdominal pain.  Family History  Problem Relation Age of Onset  . Colon cancer Brother   . Heart disease Father     Past Medical History  Diagnosis Date  . Anxiety   . Smoking   . Fibromyalgia   . Depression   . Hyperlipidemia   . DJD (degenerative joint disease)   . Hypercalcemia   . Adrenal adenoma     Stable as of 05/2012 CT scan  . Chronic respiratory failure   . COPD (chronic obstructive pulmonary disease)     some o2 prn at night  .  Coronary artery disease   . GERD (gastroesophageal reflux disease)   . Breast CA 1970    rt breast  . Lymphedema syndrome, postmastectomy     RUE    Past Surgical History  Procedure Laterality Date  . Breast surgery    . Abdominal hysterectomy    . Appendectomy    . Mastectomy, radical  1970    Right  . Squamous cell carcinoma excision      Floor of mouth  . Colonoscopy w/ biopsies and polypectomy    . Nasal sinus surgery  1994  . Back surgery      Fusion, lumbar  . Eus  04/03/2012    Procedure: UPPER ENDOSCOPIC ULTRASOUND (EUS) LINEAR;  Surgeon: Milus Banister, MD;  Location: [facility];  Service: Endoscopy;  Laterality: N/A;  radial linear    Social History:  reports that she has been smoking Cigarettes.  She has a 80 pack-year smoking history. She has never used smokeless tobacco. She reports that she does not drink alcohol or use illicit drugs.  Allergies: No Known Allergies  Medications Prior to Admission  Medication Sig Dispense Refill  . albuterol (PROVENTIL) (2.5 MG/3ML) 0.083% nebulizer solution Take 2.5 mg by nebulization every 6 (six) hours as needed for wheezing or shortness of breath.      Marland Kitchen  ergocalciferol (VITAMIN D2) 50000 UNITS capsule Take 50,000 Units by mouth every 14 (fourteen) days.       . fentaNYL (DURAGESIC - DOSED MCG/HR) 25 MCG/HR patch Place 25 mcg onto the skin every 3 (three) days.      . fluticasone (FLONASE) 50 MCG/ACT nasal spray Place 2 sprays into the nose daily as needed. For allergies       . furosemide (LASIX) 40 MG tablet Take 40 mg by mouth daily.      Marland Kitchen HYDROcodone-acetaminophen (NORCO/VICODIN) 5-325 MG per tablet Take 1-2 tablets by mouth every 4 (four) hours as needed.  10 tablet  0  . Multiple Vitamin (MULTIVITAMIN WITH MINERALS) TABS Take 1 tablet by mouth daily.  30 tablet  0  . omeprazole (PRILOSEC) 20 MG capsule Take 20 mg by mouth daily.        . polyethylene glycol (MIRALAX / GLYCOLAX) packet Take 17 g by mouth daily.      .  potassium chloride (K-DUR,KLOR-CON) 10 MEQ tablet Take 20 mEq by mouth 2 (two) times daily.      . pregabalin (LYRICA) 75 MG capsule Take 1 capsule (75 mg total) by mouth 2 (two) times daily.  60 capsule  3  . tiotropium (SPIRIVA) 18 MCG inhalation capsule Place 18 mcg into inhaler and inhale daily.        Marland Kitchen tiZANidine (ZANAFLEX) 4 MG capsule Take 4 mg by mouth at bedtime.        . triamcinolone cream (KENALOG) 0.1 % Apply 1 application topically 2 (two) times daily as needed. Swelling in right arm.      . trimethoprim (TRIMPEX) 100 MG tablet Take 100 mg by mouth daily.      . cephALEXin (KEFLEX) 500 MG capsule Take 500 mg by mouth 4 (four) times daily. Started on 10-29-13 for 10 days      . sulfamethoxazole-trimethoprim (BACTRIM DS) 800-160 MG per tablet Take 1 tablet by mouth 2 (two) times daily.  10 tablet  0    Blood pressure 124/52, pulse 79, temperature 98.8 F (37.1 C), temperature source Oral, resp. rate 16, weight 131 lb 13.4 oz (59.8 kg), SpO2 93.00%. Physical Exam: General: frail, ill-appearing white female who is laying in bed in mild distress secondary to pain HEENT: head is normocephalic, atraumatic.  Sclera are noninjected.  PERRL.  Ears and nose without any masses or lesions.  Mouth is pink and moist Heart: regular, rate, and rhythm.  Normal s1,s2. No obvious murmurs, gallops, or rubs noted.  Palpable radial and pedal pulses bilaterally Lungs: expiratory wheeze noted and a few rhonchi.  Respiratory effort nonlabored, initially on a NRB when I got there and weaned down to [place] Abd: soft, very tender in her RUQ, abdominal distention from colonic distention, but soft, no peritoneal signs, no masses, hernias, or organomegaly MS: lymphedema of her RUE secondary to axillary dissection for breast cancer. Otherwise extremities are cold but symmetrical. Skin: dry with no masses, lesions, or rashes Psych: sedated from pain medication. Psych exam unable to performed at this time    Results  for orders placed during the hospital encounter of 11/11/13 (from the past 48 hour(s))  COMPREHENSIVE METABOLIC PANEL     Status: Abnormal   Collection Time    11/11/13  7:55 AM      Result Value Ref Range   Sodium 133 (*) 137 - 147 mEq/L   Potassium 5.1  3.7 - 5.3 mEq/L   Chloride 94 (*) 96 - 112 mEq/L  CO2 28  19 - 32 mEq/L   Glucose, Bld 116 (*) 70 - 99 mg/dL   BUN 26 (*) 6 - 23 mg/dL   Creatinine, Ser 0.73  0.50 - 1.10 mg/dL   Calcium 11.2 (*) 8.4 - 10.5 mg/dL   Total Protein 7.0  6.0 - 8.3 g/dL   Albumin 3.5  3.5 - 5.2 g/dL   AST 27  0 - 37 U/L   ALT 15  0 - 35 U/L   Alkaline Phosphatase 66  39 - 117 U/L   Total Bilirubin 0.9  0.3 - 1.2 mg/dL   GFR calc non Af Amer 79 (*) >90 mL/min   GFR calc Af Amer >90  >90 mL/min   Comment: (NOTE)     The eGFR has been calculated using the CKD EPI equation.     This calculation has not been validated in all clinical situations.     eGFR's persistently <90 mL/min signify possible Chronic Kidney     Disease.  LIPASE, BLOOD     Status: None   Collection Time    11/11/13  7:55 AM      Result Value Ref Range   Lipase 14  11 - 59 U/L  CBC WITH DIFFERENTIAL     Status: Abnormal   Collection Time    11/11/13  7:55 AM      Result Value Ref Range   WBC 14.8 (*) 4.0 - 10.5 K/uL   RBC 3.71 (*) 3.87 - 5.11 MIL/uL   Hemoglobin 12.4  12.0 - 15.0 g/dL   HCT 36.7  36.0 - 46.0 %   MCV 98.9  78.0 - 100.0 fL   MCH 33.4  26.0 - 34.0 pg   MCHC 33.8  30.0 - 36.0 g/dL   RDW 12.4  11.5 - 15.5 %   Platelets 205  150 - 400 K/uL   Neutrophils Relative % 86 (*) 43 - 77 %   Neutro Abs 12.8 (*) 1.7 - 7.7 K/uL   Lymphocytes Relative 6 (*) 12 - 46 %   Lymphs Abs 0.8  0.7 - 4.0 K/uL   Monocytes Relative 8  3 - 12 %   Monocytes Absolute 1.2 (*) 0.1 - 1.0 K/uL   Eosinophils Relative 0  0 - 5 %   Eosinophils Absolute 0.0  0.0 - 0.7 K/uL   Basophils Relative 0  0 - 1 %   Basophils Absolute 0.0  0.0 - 0.1 K/uL  URINALYSIS, ROUTINE W REFLEX MICROSCOPIC      Status: Abnormal   Collection Time    11/11/13  7:55 AM      Result Value Ref Range   Color, Urine YELLOW  YELLOW   APPearance CLOUDY (*) CLEAR   Specific Gravity, Urine 1.018  1.005 - 1.030   pH 5.5  5.0 - 8.0   Glucose, UA NEGATIVE  NEGATIVE mg/dL   Hgb urine dipstick NEGATIVE  NEGATIVE   Bilirubin Urine NEGATIVE  NEGATIVE   Ketones, ur NEGATIVE  NEGATIVE mg/dL   Protein, ur NEGATIVE  NEGATIVE mg/dL   Urobilinogen, UA 0.2  0.0 - 1.0 mg/dL   Nitrite NEGATIVE  NEGATIVE   Leukocytes, UA SMALL (*) NEGATIVE  URINE MICROSCOPIC-ADD ON     Status: Abnormal   Collection Time    11/11/13  7:55 AM      Result Value Ref Range   Squamous Epithelial / LPF FEW (*) RARE   WBC, UA 3-6  <3 WBC/hpf   RBC / HPF  0-2  <3 RBC/hpf   Bacteria, UA FEW (*) RARE   Dg Chest 2 View  11/11/2013   CLINICAL DATA:  Short of breath, very weak  EXAM: CHEST  2 VIEW  COMPARISON:  Chest radiograph 03/05/2012  FINDINGS: Patient rotated rightward. Normal cardiac silhouette. No effusion, infiltrate, or pneumothorax. Aorta is ectatic. No acute osseous abnormality.  IMPRESSION: No active cardiopulmonary disease.   Electronically Signed   By: Suzy Bouchard M.D.   On: 11/11/2013 08:29   Ct Abdomen Pelvis W Contrast  11/11/2013   CLINICAL DATA:  Abdominal pain for 2 days. Nausea with recently diagnosed UTI  EXAM: CT ABDOMEN AND PELVIS WITH CONTRAST  TECHNIQUE: Multidetector CT imaging of the abdomen and pelvis was performed using the standard protocol following bolus administration of intravenous contrast.  CONTRAST:  110m OMNIPAQUE IOHEXOL 300 MG/ML  SOLN  COMPARISON:  10/31/2013 CT abdomen pelvis with contrast and CT abdomen pelvis 03/06/2012, MRCP 02/03/2012.  FINDINGS: Lung bases: Stable areas of linear scarring, no consolidation or pleural effusion. New mild cardiomegaly.  The appearance of the gallbladder has significantly changed since the recent CT of 10/31/2013. The gallbladder is markedly distended, measuring up to  13.9 x 7.0 cm in axial dimension. The gallbladder wall appears markedly thickened, measuring up to approximately 1.9 cm. Portions of the gallbladder wall mucosa markedly enhance, while others do not enhance at all, raising the possibility of a gangrenous mucosa. No calcified gallstones are visualized. There is new right upper quadrant ascites.  The patient has a chronic mild intrahepatic biliary ductal dilatation and chronic extrahepatic biliary ductal dilatation. Common bile duct measures up to 1.3 cm, unchanged in measurement from an MRCP performed February 03, 2012.  No focal hepatic lesion is identified. Pancreas divisum again noted. No pancreatic mass or calcification is identified. No acute findings associated with the pancreas.  Stable thickening of the right adrenal gland, measured 1.4 cm transverse dimension. Normal left adrenal gland.  There is a moderate amount of stool in the colon and gaseous distention of the colon without evidence of obstruction. Right posterior wall bladder diverticulum measures approximately 3 cm diameter. Urinary bladder wall thickness is normal.  Stable mild scarring and cortical thinning of the kidneys, left greater than right. Negative for hydronephrosis.  Abdominal aorta demonstrates extensive atherosclerotic changes without aneurysm. Hysterectomy. No adnexal mass. Negative for lymphadenopathy. Negative for free air.  Chronic degenerative disc disease of the lumbar spine. No acute osseous abnormality.  IMPRESSION: 1. Acute cholecystitis. There is marked wall thickening. Some of the gallbladder mucosa enhances markedly while other areas do not enhance at all, raising the possibility of gangrenous cholecystitis. No visible gallstones are seen. There is associated right upper quadrant ascites. Findings discussed by telephone with Dr. ZRogene Houston5/20/2015 at 10:40 am. 2. Chronic stable extrahepatic and mild intrahepatic biliary ductal dilatation. 3. Moderate amount of stool in the  colon and gaseous distention of the colon, without evidence of obstruction.   Electronically Signed   By: SCurlene DolphinM.D.   On: 11/11/2013 10:43       Assessment/Plan 1. Acute cholecystitis 2. Chronic respiratory failure/COPD, wears 3L [place] at home Patient Active Problem List   Diagnosis Date Noted  . Gallbladder gangrene 11/11/2013  . Multifocal atrial tachycardia 03/06/2012  . HX: breast cancer 03/06/2012  . Anxiety 03/06/2012  . Tobacco abuse 03/06/2012  . Chronic respiratory failure 03/06/2012  . Fibromyalgia 03/06/2012  . Nonspecific (abnormal) findings on radiological and other examination of biliary tract 02/03/2012  .  Urosepsis 02/02/2012  . Acute on chronic renal failure 02/02/2012  . COPD (chronic obstructive pulmonary disease) with acute bronchitis 06/11/2011  . CHF (congestive heart failure) 06/11/2011  . Osteoarthritis 06/11/2011  . Headache 06/10/2011  . Shoulder pain 06/09/2011  . Hypokalemia 06/09/2011  . Shortness of breath 06/06/2011  . Abdominal pain 06/06/2011  . Hot flashes not due to menopause 06/06/2011  . Cystitis 06/06/2011   Plan: 1. This is a frail 80 yo with a tenuous respiratory state.  She desated this morning just after 28m of dilaudid requiring a nonrebreather.  She has since been weaned back down to [place], but remains sedated 5 hours later.  Given her frailty and the severity of her cholecystitis, we do feel an operation at this time may be more harmful than helpful.  I have contacted interventional radiology to try and place a percutaneous cholecystostomy drain in her gallbladder to temporize the infection.  Her gallbladder wall is 2cm thick.  If radiology is unable to place a drain through this, then the only other option is an operation.  She would be very high risk for an open operation and would be high surgical risk given her respiratory status. 2. I have discussed this patient with Dr. MDyann Kiefand relayed our plan of care for her gallbladder and  expressed my concern about her overall state and her ability to possibly quickly decline. 3. I have started her on zosyn. 4. We will closely follow the patient.  KHenreitta Cea5/20/2015, 2:24 PM Pager: [phone]

## 2013-11-11 NOTE — ED Notes (Signed)
Pt requesting pain medication Dr. Rogene Houston aware. No orders received at this time.

## 2013-11-11 NOTE — Consult Note (Signed)
Reason for Consult:Acute cholecystitis, request perc chole drain Consulting Radiologist: Pascal Lux Referring Physician: Georgette Dover   HPI: Theresa Blackwell is an 80 y.o. female with abd pain and came to the ER. She was found to have acute cholecystitis by CT. She has been admitted. Surgery team has consulted and requests IR place perc chole drain. She is high risk for major operative procedure from a pulmonary standpoint. Had received some Dilaudid ($RemoveBefore'1mg'[id number]$ ) earlier this morning that caused O2 dest to 86%. Had to placed on NRB and sats returned to 100% and she is now on [place] O2 at 3L She currently denies CP, SOB. PMHx, meds, imaging, labs reviewed.  Past Medical History:  Past Medical History  Diagnosis Date  . Anxiety   . Smoking   . Fibromyalgia   . Depression   . Hyperlipidemia   . DJD (degenerative joint disease)   . Hypercalcemia   . Adrenal adenoma     Stable as of 05/2012 CT scan  . Chronic respiratory failure   . COPD (chronic obstructive pulmonary disease)     some o2 prn at night  . Coronary artery disease   . GERD (gastroesophageal reflux disease)   . Breast CA 1970    rt breast  . Lymphedema syndrome, postmastectomy     RUE    Surgical History:  Past Surgical History  Procedure Laterality Date  . Breast surgery    . Abdominal hysterectomy    . Appendectomy    . Mastectomy, radical  1970    Right  . Squamous cell carcinoma excision      Floor of mouth  . Colonoscopy w/ biopsies and polypectomy    . Nasal sinus surgery  1994  . Back surgery      Fusion, lumbar  . Eus  04/03/2012    Procedure: UPPER ENDOSCOPIC ULTRASOUND (EUS) LINEAR;  Surgeon: Milus Banister, MD;  Location: [facility];  Service: Endoscopy;  Laterality: N/A;  radial linear    Family History:  Family History  Problem Relation Age of Onset  . Colon cancer Brother   . Heart disease Father     Social History:  reports that she has been smoking Cigarettes.  She has a 80 pack-year smoking history. She  has never used smokeless tobacco. She reports that she does not drink alcohol or use illicit drugs.  Allergies: No Known Allergies  Medications: Current facility-administered medications:0.9 %  sodium chloride infusion, , Intravenous, Continuous, Mervin Kung, MD, Last Rate: 50 mL/hr at 11/11/13 0850;  0.9 %  sodium chloride infusion, , Intravenous, STAT, Mervin Kung, MD;  piperacillin-tazobactam (ZOSYN) IVPB 3.375 g, 3.375 g, Intravenous, 3 times per day, Henreitta Cea, PA-C  ROS: See HPI for pertinent findings, otherwise complete 10 system review negative.  Physical Exam: Blood pressure 124/52, pulse 79, temperature 98.8 F (37.1 C), temperature source Oral, resp. rate 16, weight 131 lb 13.4 oz (59.8 kg), SpO2 93.00%. ENT: unremarkable airway Lungs: poor insp effort, breathing not labored, sl diminished BS at bases. Heart: Reg Abd: soft but distended. Fullness in RUQ, mildly tender   Labs: CBC  Recent Labs  11/11/13 0755  WBC 14.8*  HGB 12.4  HCT 36.7  PLT 205   MET  Recent Labs  11/11/13 0755  NA 133*  K 5.1  CL 94*  CO2 28  GLUCOSE 116*  BUN 26*  CREATININE 0.73  CALCIUM 11.2*    Recent Labs  11/11/13 0755  PROT 7.0  ALBUMIN 3.5  AST 27  ALT 15  ALKPHOS 66  BILITOT 0.9  LIPASE 14   Dg Chest 2 View  11/11/2013   CLINICAL DATA:  Short of breath, very weak  EXAM: CHEST  2 VIEW  COMPARISON:  Chest radiograph 03/05/2012  FINDINGS: Patient rotated rightward. Normal cardiac silhouette. No effusion, infiltrate, or pneumothorax. Aorta is ectatic. No acute osseous abnormality.  IMPRESSION: No active cardiopulmonary disease.   Electronically Signed   By: Suzy Bouchard M.D.   On: 11/11/2013 08:29   Ct Abdomen Pelvis W Contrast  11/11/2013   CLINICAL DATA:  Abdominal pain for 2 days. Nausea with recently diagnosed UTI  EXAM: CT ABDOMEN AND PELVIS WITH CONTRAST  TECHNIQUE: Multidetector CT imaging of the abdomen and pelvis was performed using the  standard protocol following bolus administration of intravenous contrast.  CONTRAST:  10mL OMNIPAQUE IOHEXOL 300 MG/ML  SOLN  COMPARISON:  10/31/2013 CT abdomen pelvis with contrast and CT abdomen pelvis 03/06/2012, MRCP 02/03/2012.  FINDINGS: Lung bases: Stable areas of linear scarring, no consolidation or pleural effusion. New mild cardiomegaly.  The appearance of the gallbladder has significantly changed since the recent CT of 10/31/2013. The gallbladder is markedly distended, measuring up to 13.9 x 7.0 cm in axial dimension. The gallbladder wall appears markedly thickened, measuring up to approximately 1.9 cm. Portions of the gallbladder wall mucosa markedly enhance, while others do not enhance at all, raising the possibility of a gangrenous mucosa. No calcified gallstones are visualized. There is new right upper quadrant ascites.  The patient has a chronic mild intrahepatic biliary ductal dilatation and chronic extrahepatic biliary ductal dilatation. Common bile duct measures up to 1.3 cm, unchanged in measurement from an MRCP performed February 03, 2012.  No focal hepatic lesion is identified. Pancreas divisum again noted. No pancreatic mass or calcification is identified. No acute findings associated with the pancreas.  Stable thickening of the right adrenal gland, measured 1.4 cm transverse dimension. Normal left adrenal gland.  There is a moderate amount of stool in the colon and gaseous distention of the colon without evidence of obstruction. Right posterior wall bladder diverticulum measures approximately 3 cm diameter. Urinary bladder wall thickness is normal.  Stable mild scarring and cortical thinning of the kidneys, left greater than right. Negative for hydronephrosis.  Abdominal aorta demonstrates extensive atherosclerotic changes without aneurysm. Hysterectomy. No adnexal mass. Negative for lymphadenopathy. Negative for free air.  Chronic degenerative disc disease of the lumbar spine. No acute osseous  abnormality.  IMPRESSION: 1. Acute cholecystitis. There is marked wall thickening. Some of the gallbladder mucosa enhances markedly while other areas do not enhance at all, raising the possibility of gangrenous cholecystitis. No visible gallstones are seen. There is associated right upper quadrant ascites. Findings discussed by telephone with Dr. Rogene Houston 11/11/2013 at 10:40 am. 2. Chronic stable extrahepatic and mild intrahepatic biliary ductal dilatation. 3. Moderate amount of stool in the colon and gaseous distention of the colon, without evidence of obstruction.   Electronically Signed   By: Curlene Dolphin M.D.   On: 11/11/2013 10:43    Assessment/Plan: Acute gangrenous cholecystitis. For Image guided percutaneous cholecystostomy drain placement Agree pt is higher risk for respiratory issues with sedation. Will have to be cautious/light with sedation. Discussed procedure as well as risks and complications with pt, husband and son. These include bleeding, infection, respiratory suppression/failure with sedation. Possible post procedure rigors or transient sepsis. Labs reviewed, checking baseline coags. Consent has been signed by son.  Ascencion Dike PA-C 11/11/2013, 2:33 PM

## 2013-11-11 NOTE — ED Notes (Signed)
Pt. Is  Placed on non rebreather at this time

## 2013-11-11 NOTE — Consult Note (Signed)
Severe acute cholecystitis - attempt percutaneous drainage If unable to drain, will need cholecystectomy (likely open procedure).  This would be very high risk in this patient with very poor pulmonary function and continued tobacco abuse. NPO ABX  [name]. Georgette Dover, MD, [facility] Surgery  General/ Trauma Surgery  11/11/2013 5:02 PM

## 2013-11-11 NOTE — H&P (Signed)
[facility] History and Physical  SAQQARA NEMET [id number] DOB: [birth year] DOA: 11/11/2013  Referring physician: Dr. Rogene Houston  PCP: Wenda Low, MD   Chief Complaint: Abdominal pain, nausea  HPI: Theresa Blackwell is a 80 y.o. female with a past medical history significant for tobacco abuse, depression/anxiety, hyperglycemia, hyperlipidemia, chronic respiratory failure (on 3 L at home) secondary to COPD, GERD and chronic back pain; presented to the major department secondary to abdominal pain and nausea. Patient reports having some nausea especially with food intake for approximately 2 months; but no other symptoms at that time. She reports that approximately one to 2 weeks ago she began having pain in her abdomen with associated nausea worsen with food intake. Initially she thought that it was another episode of urinary tract infection and she has had recurrent issues with this in the past, and was placed on antibiotics by Dr. Izora Gala. Despite treatment symptoms continue worsening to the point that she required to come to the major department by EMS for further evaluation and treatment. In the ED patient was found to have right upper quadrant pain, elevated wbc's (14,000 range), mild signs of dehydration (hyponatremia and hypochloremia). A CT scan of the abdomen demonstrated findings suggesting acute cholecystitis and severe thickening of patient's gallbladder. Patient denies any palpitations, chest pain, shortness of breath, hematemesis, melena, hematochezia, dysuria, headaches, blurred vision or any other acute complaints. [facility] has been called to admit the patient for further evaluation and treatment given multiple comorbidities and high risk for immediate surgery. General surgery has been consulted and will follow the patient along.  Review of Systems:  Negative except as otherwise mentioned on HPI.  Past Medical History  Diagnosis Date  . Anxiety   . Smoking   .  Fibromyalgia   . Depression   . Hyperlipidemia   . Hypercalcemia   . Adrenal adenoma     Stable as of 05/2012 CT scan  . Chronic respiratory failure   . Coronary artery disease   . GERD (gastroesophageal reflux disease)   . Lymphedema syndrome, postmastectomy     RUE  . Breast CA 1970    rt breast  . CHF (congestive heart failure)   . COPD (chronic obstructive pulmonary disease)   . On home oxygen therapy     "3L; 24/7" (11/11/2013)  . DJD (degenerative joint disease)   . Arthritis     "joints" (11/11/2013)  . Chronic back pain     "shoulders to lower back" (11/11/2013)  . Frequent UTI    Past Surgical History  Procedure Laterality Date  . Abdominal hysterectomy    . Appendectomy    . Squamous cell carcinoma excision      Floor of mouth  . Colonoscopy w/ biopsies and polypectomy    . Nasal sinus surgery  1994  . Eus  04/03/2012    Procedure: UPPER ENDOSCOPIC ULTRASOUND (EUS) LINEAR;  Surgeon: Milus Banister, MD;  Location: [facility];  Service: Endoscopy;  Laterality: N/A;  radial linear  . Mastectomy, radical Right 1970  . Breast biopsy Right ~ 1970  . Refractive surgery Bilateral ~ 2012  . Back surgery    . Posterior lumbar fusion  ?1970's   Social History:  reports that she has been smoking Cigarettes.  She has a 192 pack-year smoking history. She has never used smokeless tobacco. She reports that she does not drink alcohol or use illicit drugs.  No Known Allergies  Family History  Problem Relation Age of Onset  .  Colon cancer Brother   . Heart disease Father      Prior to Admission medications   Medication Sig Start Date End Date Taking? Authorizing Provider  albuterol (PROVENTIL) (2.5 MG/3ML) 0.083% nebulizer solution Take 2.5 mg by nebulization every 6 (six) hours as needed for wheezing or shortness of breath.   Yes Historical Provider, MD  ergocalciferol (VITAMIN D2) 50000 UNITS capsule Take 50,000 Units by mouth every 14 (fourteen) days.    Yes Historical  Provider, MD  fentaNYL (DURAGESIC - DOSED MCG/HR) 25 MCG/HR patch Place 25 mcg onto the skin every 3 (three) days.   Yes Historical Provider, MD  fluticasone (FLONASE) 50 MCG/ACT nasal spray Place 2 sprays into the nose daily as needed. For allergies    Yes Historical Provider, MD  furosemide (LASIX) 40 MG tablet Take 40 mg by mouth daily.   Yes Historical Provider, MD  HYDROcodone-acetaminophen (NORCO/VICODIN) 5-325 MG per tablet Take 1-2 tablets by mouth every 4 (four) hours as needed. 11/01/13  Yes Illene Labrador, PA-C  Multiple Vitamin (MULTIVITAMIN WITH MINERALS) TABS Take 1 tablet by mouth daily. 03/09/12  Yes Wenda Low, MD  omeprazole (PRILOSEC) 20 MG capsule Take 20 mg by mouth daily.     Yes Historical Provider, MD  polyethylene glycol (MIRALAX / GLYCOLAX) packet Take 17 g by mouth daily.   Yes Historical Provider, MD  potassium chloride (K-DUR,KLOR-CON) 10 MEQ tablet Take 20 mEq by mouth 2 (two) times daily.   Yes Historical Provider, MD  pregabalin (LYRICA) 75 MG capsule Take 1 capsule (75 mg total) by mouth 2 (two) times daily. 03/10/12  Yes Wenda Low, MD  tiotropium (SPIRIVA) 18 MCG inhalation capsule Place 18 mcg into inhaler and inhale daily.     Yes Historical Provider, MD  tiZANidine (ZANAFLEX) 4 MG capsule Take 4 mg by mouth at bedtime.     Yes Historical Provider, MD  triamcinolone cream (KENALOG) 0.1 % Apply 1 application topically 2 (two) times daily as needed. Swelling in right arm.   Yes Historical Provider, MD  trimethoprim (TRIMPEX) 100 MG tablet Take 100 mg by mouth daily.   Yes Historical Provider, MD  cephALEXin (KEFLEX) 500 MG capsule Take 500 mg by mouth 4 (four) times daily. Started on 10-29-13 for 10 days    Historical Provider, MD  sulfamethoxazole-trimethoprim (BACTRIM DS) 800-160 MG per tablet Take 1 tablet by mouth 2 (two) times daily. 11/01/13   Illene Labrador, PA-C   Physical Exam: Filed Vitals:   11/11/13 1700  BP: 126/66  Pulse: 98  Temp:   Resp: 23     BP 126/66  Pulse 98  Temp(Src) 98.8 F (37.1 C) (Oral)  Resp 23  Wt 59.8 kg (131 lb 13.4 oz)  SpO2 98%  General: frail, ill-appearing white female who is laying in bed in mild distress secondary to pain in her belly; no fever HEENT: head is normocephalic, atraumatic. Sclera are noninjected. PERRL. Ears and nose without any masses, drainage or lesions. Mouth is pink and moist  Heart: regular, rate, and rhythm. Normal s1,s2. No obvious murmurs, gallops, or rubs noted. No JVD and no LE edema. Lungs: expiratory wheeze noted and a few rhonchi. Respiratory effort nonlabored, initially on a NRB when I got there and weaned down to [place]  Abd: soft, very tender in her RUQ, abdominal distention from colonic distention, but soft, no peritoneal signs, no masses, hernias, or organomegaly  MS: lymphedema of her RUE secondary to axillary dissection for breast cancer. Otherwise extremities  are cold but symmetrical.  Skin: dry with no masses, lesions, or rashes  Psych: somnolent/lethargic after narcotics. No SI or hallucination. Patent was easily to aroused and AAOX2 Neuro: non focal deficit.          Labs on Admission:  Basic Metabolic Panel:  Recent Labs Lab 11/11/13 0755  NA 133*  K 5.1  CL 94*  CO2 28  GLUCOSE 116*  BUN 26*  CREATININE 0.73  CALCIUM 11.2*   Liver Function Tests:  Recent Labs Lab 11/11/13 0755  AST 27  ALT 15  ALKPHOS 66  BILITOT 0.9  PROT 7.0  ALBUMIN 3.5    Recent Labs Lab 11/11/13 0755  LIPASE 14   CBC:  Recent Labs Lab 11/11/13 0755  WBC 14.8*  NEUTROABS 12.8*  HGB 12.4  HCT 36.7  MCV 98.9  PLT 205   Radiological Exams on Admission: Dg Chest 2 View  11/11/2013   CLINICAL DATA:  Short of breath, very weak  EXAM: CHEST  2 VIEW  COMPARISON:  Chest radiograph 03/05/2012  FINDINGS: Patient rotated rightward. Normal cardiac silhouette. No effusion, infiltrate, or pneumothorax. Aorta is ectatic. No acute osseous abnormality.  IMPRESSION: No active  cardiopulmonary disease.   Electronically Signed   By: Suzy Bouchard M.D.   On: 11/11/2013 08:29   Ct Abdomen Pelvis W Contrast  11/11/2013   CLINICAL DATA:  Abdominal pain for 2 days. Nausea with recently diagnosed UTI  EXAM: CT ABDOMEN AND PELVIS WITH CONTRAST  TECHNIQUE: Multidetector CT imaging of the abdomen and pelvis was performed using the standard protocol following bolus administration of intravenous contrast.  CONTRAST:  8mL OMNIPAQUE IOHEXOL 300 MG/ML  SOLN  COMPARISON:  10/31/2013 CT abdomen pelvis with contrast and CT abdomen pelvis 03/06/2012, MRCP 02/03/2012.  FINDINGS: Lung bases: Stable areas of linear scarring, no consolidation or pleural effusion. New mild cardiomegaly.  The appearance of the gallbladder has significantly changed since the recent CT of 10/31/2013. The gallbladder is markedly distended, measuring up to 13.9 x 7.0 cm in axial dimension. The gallbladder wall appears markedly thickened, measuring up to approximately 1.9 cm. Portions of the gallbladder wall mucosa markedly enhance, while others do not enhance at all, raising the possibility of a gangrenous mucosa. No calcified gallstones are visualized. There is new right upper quadrant ascites.  The patient has a chronic mild intrahepatic biliary ductal dilatation and chronic extrahepatic biliary ductal dilatation. Common bile duct measures up to 1.3 cm, unchanged in measurement from an MRCP performed February 03, 2012.  No focal hepatic lesion is identified. Pancreas divisum again noted. No pancreatic mass or calcification is identified. No acute findings associated with the pancreas.  Stable thickening of the right adrenal gland, measured 1.4 cm transverse dimension. Normal left adrenal gland.  There is a moderate amount of stool in the colon and gaseous distention of the colon without evidence of obstruction. Right posterior wall bladder diverticulum measures approximately 3 cm diameter. Urinary bladder wall thickness is  normal.  Stable mild scarring and cortical thinning of the kidneys, left greater than right. Negative for hydronephrosis.  Abdominal aorta demonstrates extensive atherosclerotic changes without aneurysm. Hysterectomy. No adnexal mass. Negative for lymphadenopathy. Negative for free air.  Chronic degenerative disc disease of the lumbar spine. No acute osseous abnormality.  IMPRESSION: 1. Acute cholecystitis. There is marked wall thickening. Some of the gallbladder mucosa enhances markedly while other areas do not enhance at all, raising the possibility of gangrenous cholecystitis. No visible gallstones are seen. There is associated  right upper quadrant ascites. Findings discussed by telephone with Dr. Rogene Houston 11/11/2013 at 10:40 am. 2. Chronic stable extrahepatic and mild intrahepatic biliary ductal dilatation. 3. Moderate amount of stool in the colon and gaseous distention of the colon, without evidence of obstruction.   Electronically Signed   By: Curlene Dolphin M.D.   On: 11/11/2013 10:43   Ir Perc Cholecystostomy  11/11/2013   INDICATION: Acute cholecystitis, poor surgical candidate  EXAM: ULTRASOUND AND FLUOROSCOPIC-GUIDED CHOLECYSTOSTOMY TUBE PLACEMENT  COMPARISON:  CT abdomen pelvis -11/11/2013  MEDICATIONS: Fentanyl 75 mcg IV; Versed 1.5 mg IV; The patient is currently admitted to the hospital and on intravenous antibiotics. Antibiotics were administered within an appropriate time frame prior to skin puncture.  ANESTHESIA/SEDATION: Total Moderate Sedation Time  20 minutes  CONTRAST:  33mL OMNIPAQUE IOHEXOL 300 MG/ML  SOLN  FLUOROSCOPY TIME:  48 seconds  COMPLICATIONS: None immediate.  PROCEDURE: Informed written consent was obtained from the patient after a discussion of the risks, benefits and alternatives to treatment. Questions regarding the procedure were encouraged and answered. A timeout was performed prior to the initiation of the procedure.  The right upper abdominal quadrant was prepped and draped  in the usual sterile fashion, and a sterile drape was applied covering the operative field. Maximum barrier sterile technique with sterile gowns and gloves were used for the procedure. A timeout was performed prior to the initiation of the procedure. Local anesthesia was provided with 1% lidocaine with epinephrine.  Ultrasound scanning of the right upper quadrant demonstrates a markedly dilated gallbladder. Utilizing a transhepatic approach, a 22 gauge needle was advanced into the gallbladder under direct ultrasound guidance. An ultrasound image was saved for documentation purposes. Appropriate intraluminal puncture was confirmed with the efflux of bile and advancement of an 0.018 wire into the gallbladder lumen. The needle was exchanged for an Silo set. A small amount of contrast was injected to confirm appropriate intraluminal positioning. Over a Benson wire, a 12.2-French Cook cholecystomy tube was advanced into the gallbladder fossa, coiled and locked. Bile was aspirated and a small amount of contrast was injected as several post procedural spot radiographic images were obtained in various obliquities. The catheter was secured to the skin with suture, connected to a drainage bag and a dressing was placed. The patient tolerated the procedure well without immediate post procedural complication.  IMPRESSION: Successful ultrasound and fluoroscopic guided placement of a 10.2 French cholecystostomy tube.   Electronically Signed   By: Sandi Mariscal M.D.   On: 11/11/2013 17:16    Assessment/Plan 1-Gallbladder gangrene and acute cholecystitis: Patient is frail and a high risk for surgery. She is complaining of nausea, right upper quadrant pain leukocytosis and chills.  -Surgery has been consulted and at this moment decision is to put the patient on IV antibiotics and perform percutaneous drainage of her gallbladder.  -Will keep patient n.p.o. -Will be precautions with pain medications regarding Easily  desaturation with narcotics  -Continue supportive care.  2-Abdominal pain, nausea and leukocytosis: Secondary to problem #1. -Patient will be started on Zosyn -Interventional radiologist had perform percutaneous drainage -Will use gentle/judicious as needed analgesic medications and also as needed antiemetics.   3-chronic respiratory failure secondary to COPD: No wheezing. Patient desaturated with the use of narcotics as expected given chronic respiratory failure. -Will continue as needed albuterol; will continue his Spiriva and will also start the use of Pulmicort. -Close monitoring to patient's vital signs and oxygen saturation.  4-GERD (gastroesophageal reflux disease): Will use IV Pepcid given  npo status   5-Depression with anxiety: Stable. No suicidal ideation or severe anxiety.  -Progress to resume home medication regimen as patient able to tolerate by mouth.   6-HLD (hyperlipidemia): Will check lipid profile. Patient to resume home medication regimen with Dr. Lia Foyer showing take by mouth.   7-chronic diastolic heart failure: Currently compensated. -Given signs of mild dehydration, we'll provide gentle fluid resuscitation and will hold diuretics overnight. -Daily weights -Strict intake and output.  DVT: SCD's  General surgery (Dr. Rogers Seeds) IR: (Dr. Pascal Lux for percutaneous drainage of gallbladder)  Code Status: Full Family Communication: husband and son Disposition Plan: MedSurg bed, length of stay more than 2 midnights, inpatient status.  Time spent: 50 minutes  Barton Dubois [facility] Pager [phone]  **Disclaimer: This note may have been dictated with voice recognition software. Similar sounding words can inadvertently be transcribed and this note may contain transcription errors which may not have been corrected upon publication of note.**

## 2013-11-11 NOTE — ED Provider Notes (Signed)
CSN: [id number]     Arrival date & time 11/11/13  7371 History   First MD Initiated Contact with Patient 11/11/13 [phone]     Chief Complaint  Patient presents with  . Abdominal Pain     (Consider location/radiation/quality/duration/timing/severity/associated sxs/prior Treatment) Patient is a 80 y.o. female presenting with abdominal pain. The history is provided by the patient and the spouse.  Abdominal Pain Associated symptoms: nausea, shortness of breath and vomiting   Associated symptoms: no chest pain, no dysuria and no fever    patient returns for persistent abdominal pain mostly on the right upper quadrant area. Patient was seen May 9 had a CAT scan at that time blood work that was normal. Patient does have some baseline dilatation in her hepatic biliary area has been present only back to 2013. Patient with persistent pain there states getting worse. Patient has a history of bad COPD. Patient states pain is 10 out of 10. Associated with some nausea and some vomiting. Patient has shortness of breath but that is baseline. Patient states something is not right in her abdomen. Talks about distention in that area but that has been present in the past as well.  Past Medical History  Diagnosis Date  . Anxiety   . Smoking   . Fibromyalgia   . Right arm cellulitis   . Depression   . Hyperlipidemia   . DJD (degenerative joint disease)   . Hypercalcemia   . Adrenal adenoma     Stable as of 05/2012 CT scan  . Chronic respiratory failure   . COPD (chronic obstructive pulmonary disease)     some o2 prn at night  . Coronary artery disease   . GERD (gastroesophageal reflux disease)   . Breast CA 1970    rt breast   Past Surgical History  Procedure Laterality Date  . Breast surgery    . Abdominal hysterectomy    . Appendectomy    . Mastectomy, radical  1970    Right  . Squamous cell carcinoma excision      Floor of mouth  . Colonoscopy w/ biopsies and polypectomy    . Nasal sinus  surgery  1994  . Back surgery      Fusion, lumbar  . Eus  04/03/2012    Procedure: UPPER ENDOSCOPIC ULTRASOUND (EUS) LINEAR;  Surgeon: Milus Banister, MD;  Location: [facility];  Service: Endoscopy;  Laterality: N/A;  radial linear   Family History  Problem Relation Age of Onset  . Colon cancer Brother   . Heart disease Father    History  Substance Use Topics  . Smoking status: Current Every Day Smoker -- 2.00 packs/day for 40 years    Types: Cigarettes  . Smokeless tobacco: Never Used  . Alcohol Use: No     Comment: stopped for 1 week   OB History   Grav Para Term Preterm Abortions TAB SAB Ect Mult Living                 Review of Systems  Constitutional: Negative for fever.  HENT: Negative for congestion.   Eyes: Negative for redness.  Respiratory: Positive for shortness of breath.   Cardiovascular: Negative for chest pain.  Gastrointestinal: Positive for nausea, vomiting and abdominal pain.  Genitourinary: Negative for dysuria.  Musculoskeletal: Negative for back pain.  Skin: Negative for wound.  Neurological: Negative for headaches.  Hematological: Does not bruise/bleed easily.  Psychiatric/Behavioral: Negative for confusion.      Allergies  Review  of patient's allergies indicates no known allergies.  Home Medications   Prior to Admission medications   Medication Sig Start Date End Date Taking? Authorizing Provider  albuterol (PROVENTIL) (2.5 MG/3ML) 0.083% nebulizer solution Take 2.5 mg by nebulization every 6 (six) hours as needed for wheezing or shortness of breath.   Yes Historical Provider, MD  ergocalciferol (VITAMIN D2) 50000 UNITS capsule Take 50,000 Units by mouth every 14 (fourteen) days.    Yes Historical Provider, MD  fentaNYL (DURAGESIC - DOSED MCG/HR) 25 MCG/HR patch Place 25 mcg onto the skin every 3 (three) days.   Yes Historical Provider, MD  fluticasone (FLONASE) 50 MCG/ACT nasal spray Place 2 sprays into the nose daily as needed. For  allergies    Yes Historical Provider, MD  furosemide (LASIX) 40 MG tablet Take 40 mg by mouth daily.   Yes Historical Provider, MD  HYDROcodone-acetaminophen (NORCO/VICODIN) 5-325 MG per tablet Take 1-2 tablets by mouth every 4 (four) hours as needed. 11/01/13  Yes Illene Labrador, PA-C  Multiple Vitamin (MULTIVITAMIN WITH MINERALS) TABS Take 1 tablet by mouth daily. 03/09/12  Yes Wenda Low, MD  omeprazole (PRILOSEC) 20 MG capsule Take 20 mg by mouth daily.     Yes Historical Provider, MD  polyethylene glycol (MIRALAX / GLYCOLAX) packet Take 17 g by mouth daily.   Yes Historical Provider, MD  potassium chloride (K-DUR,KLOR-CON) 10 MEQ tablet Take 20 mEq by mouth 2 (two) times daily.   Yes Historical Provider, MD  pregabalin (LYRICA) 75 MG capsule Take 1 capsule (75 mg total) by mouth 2 (two) times daily. 03/10/12  Yes Wenda Low, MD  tiotropium (SPIRIVA) 18 MCG inhalation capsule Place 18 mcg into inhaler and inhale daily.     Yes Historical Provider, MD  tiZANidine (ZANAFLEX) 4 MG capsule Take 4 mg by mouth at bedtime.     Yes Historical Provider, MD  triamcinolone cream (KENALOG) 0.1 % Apply 1 application topically 2 (two) times daily as needed. Swelling in right arm.   Yes Historical Provider, MD  trimethoprim (TRIMPEX) 100 MG tablet Take 100 mg by mouth daily.   Yes Historical Provider, MD  cephALEXin (KEFLEX) 500 MG capsule Take 500 mg by mouth 4 (four) times daily. Started on 10-29-13 for 10 days    Historical Provider, MD  sulfamethoxazole-trimethoprim (BACTRIM DS) 800-160 MG per tablet Take 1 tablet by mouth 2 (two) times daily. 11/01/13   Illene Labrador, PA-C   BP 138/44  Pulse 83  Temp(Src) 98.8 F (37.1 C) (Oral)  Resp 17  SpO2 100% Physical Exam  Nursing note and vitals reviewed. Constitutional: She is oriented to person, place, and time. She appears well-developed and well-nourished. She appears distressed.  HENT:  Head: Normocephalic and atraumatic.  Eyes: Conjunctivae and EOM  are normal. Pupils are equal, round, and reactive to light.  Neck: Normal range of motion.  Cardiovascular: Normal rate and regular rhythm.   Pulmonary/Chest: Effort normal and breath sounds normal. No respiratory distress. She has no wheezes.  Status post mastectomy right chest.  Clinically appears to be a radical mastectomy.  Abdominal: Soft. Bowel sounds are normal. There is tenderness.  Musculoskeletal: Normal range of motion. She exhibits edema.  Patient with edema to her right arm. This is baseline for her she's had a mastectomy on that side.  Neurological: She is alert and oriented to person, place, and time. No cranial nerve deficit. Coordination normal.  Skin: Skin is warm.    ED Course  Procedures (including critical  care time) Labs Review Labs Reviewed  COMPREHENSIVE METABOLIC PANEL - Abnormal; Notable for the following:    Sodium 133 (*)    Chloride 94 (*)    Glucose, Bld 116 (*)    BUN 26 (*)    Calcium 11.2 (*)    GFR calc non Af Amer 79 (*)    All other components within normal limits  CBC WITH DIFFERENTIAL - Abnormal; Notable for the following:    WBC 14.8 (*)    RBC 3.71 (*)    Neutrophils Relative % 86 (*)    Neutro Abs 12.8 (*)    Lymphocytes Relative 6 (*)    Monocytes Absolute 1.2 (*)    All other components within normal limits  URINALYSIS, ROUTINE W REFLEX MICROSCOPIC - Abnormal; Notable for the following:    APPearance CLOUDY (*)    Leukocytes, UA SMALL (*)    All other components within normal limits  URINE MICROSCOPIC-ADD ON - Abnormal; Notable for the following:    Squamous Epithelial / LPF FEW (*)    Bacteria, UA FEW (*)    All other components within normal limits  URINE CULTURE  LIPASE, BLOOD   Results for orders placed during the hospital encounter of 11/11/13  COMPREHENSIVE METABOLIC PANEL      Result Value Ref Range   Sodium 133 (*) 137 - 147 mEq/L   Potassium 5.1  3.7 - 5.3 mEq/L   Chloride 94 (*) 96 - 112 mEq/L   CO2 28  19 - 32  mEq/L   Glucose, Bld 116 (*) 70 - 99 mg/dL   BUN 26 (*) 6 - 23 mg/dL   Creatinine, Ser 0.73  0.50 - 1.10 mg/dL   Calcium 11.2 (*) 8.4 - 10.5 mg/dL   Total Protein 7.0  6.0 - 8.3 g/dL   Albumin 3.5  3.5 - 5.2 g/dL   AST 27  0 - 37 U/L   ALT 15  0 - 35 U/L   Alkaline Phosphatase 66  39 - 117 U/L   Total Bilirubin 0.9  0.3 - 1.2 mg/dL   GFR calc non Af Amer 79 (*) >90 mL/min   GFR calc Af Amer >90  >90 mL/min  LIPASE, BLOOD      Result Value Ref Range   Lipase 14  11 - 59 U/L  CBC WITH DIFFERENTIAL      Result Value Ref Range   WBC 14.8 (*) 4.0 - 10.5 K/uL   RBC 3.71 (*) 3.87 - 5.11 MIL/uL   Hemoglobin 12.4  12.0 - 15.0 g/dL   HCT 36.7  36.0 - 46.0 %   MCV 98.9  78.0 - 100.0 fL   MCH 33.4  26.0 - 34.0 pg   MCHC 33.8  30.0 - 36.0 g/dL   RDW 12.4  11.5 - 15.5 %   Platelets 205  150 - 400 K/uL   Neutrophils Relative % 86 (*) 43 - 77 %   Neutro Abs 12.8 (*) 1.7 - 7.7 K/uL   Lymphocytes Relative 6 (*) 12 - 46 %   Lymphs Abs 0.8  0.7 - 4.0 K/uL   Monocytes Relative 8  3 - 12 %   Monocytes Absolute 1.2 (*) 0.1 - 1.0 K/uL   Eosinophils Relative 0  0 - 5 %   Eosinophils Absolute 0.0  0.0 - 0.7 K/uL   Basophils Relative 0  0 - 1 %   Basophils Absolute 0.0  0.0 - 0.1 K/uL  URINALYSIS, ROUTINE W REFLEX  MICROSCOPIC      Result Value Ref Range   Color, Urine YELLOW  YELLOW   APPearance CLOUDY (*) CLEAR   Specific Gravity, Urine 1.018  1.005 - 1.030   pH 5.5  5.0 - 8.0   Glucose, UA NEGATIVE  NEGATIVE mg/dL   Hgb urine dipstick NEGATIVE  NEGATIVE   Bilirubin Urine NEGATIVE  NEGATIVE   Ketones, ur NEGATIVE  NEGATIVE mg/dL   Protein, ur NEGATIVE  NEGATIVE mg/dL   Urobilinogen, UA 0.2  0.0 - 1.0 mg/dL   Nitrite NEGATIVE  NEGATIVE   Leukocytes, UA SMALL (*) NEGATIVE  URINE MICROSCOPIC-ADD ON      Result Value Ref Range   Squamous Epithelial / LPF FEW (*) RARE   WBC, UA 3-6  <3 WBC/hpf   RBC / HPF 0-2  <3 RBC/hpf   Bacteria, UA FEW (*) RARE     Imaging Review Dg Chest 2  View  11/11/2013   CLINICAL DATA:  Short of breath, very weak  EXAM: CHEST  2 VIEW  COMPARISON:  Chest radiograph 03/05/2012  FINDINGS: Patient rotated rightward. Normal cardiac silhouette. No effusion, infiltrate, or pneumothorax. Aorta is ectatic. No acute osseous abnormality.  IMPRESSION: No active cardiopulmonary disease.   Electronically Signed   By: Suzy Bouchard M.D.   On: 11/11/2013 08:29   Ct Abdomen Pelvis W Contrast  11/11/2013   CLINICAL DATA:  Abdominal pain for 2 days. Nausea with recently diagnosed UTI  EXAM: CT ABDOMEN AND PELVIS WITH CONTRAST  TECHNIQUE: Multidetector CT imaging of the abdomen and pelvis was performed using the standard protocol following bolus administration of intravenous contrast.  CONTRAST:  55mL OMNIPAQUE IOHEXOL 300 MG/ML  SOLN  COMPARISON:  10/31/2013 CT abdomen pelvis with contrast and CT abdomen pelvis 03/06/2012, MRCP 02/03/2012.  FINDINGS: Lung bases: Stable areas of linear scarring, no consolidation or pleural effusion. New mild cardiomegaly.  The appearance of the gallbladder has significantly changed since the recent CT of 10/31/2013. The gallbladder is markedly distended, measuring up to 13.9 x 7.0 cm in axial dimension. The gallbladder wall appears markedly thickened, measuring up to approximately 1.9 cm. Portions of the gallbladder wall mucosa markedly enhance, while others do not enhance at all, raising the possibility of a gangrenous mucosa. No calcified gallstones are visualized. There is new right upper quadrant ascites.  The patient has a chronic mild intrahepatic biliary ductal dilatation and chronic extrahepatic biliary ductal dilatation. Common bile duct measures up to 1.3 cm, unchanged in measurement from an MRCP performed February 03, 2012.  No focal hepatic lesion is identified. Pancreas divisum again noted. No pancreatic mass or calcification is identified. No acute findings associated with the pancreas.  Stable thickening of the right adrenal  gland, measured 1.4 cm transverse dimension. Normal left adrenal gland.  There is a moderate amount of stool in the colon and gaseous distention of the colon without evidence of obstruction. Right posterior wall bladder diverticulum measures approximately 3 cm diameter. Urinary bladder wall thickness is normal.  Stable mild scarring and cortical thinning of the kidneys, left greater than right. Negative for hydronephrosis.  Abdominal aorta demonstrates extensive atherosclerotic changes without aneurysm. Hysterectomy. No adnexal mass. Negative for lymphadenopathy. Negative for free air.  Chronic degenerative disc disease of the lumbar spine. No acute osseous abnormality.  IMPRESSION: 1. Acute cholecystitis. There is marked wall thickening. Some of the gallbladder mucosa enhances markedly while other areas do not enhance at all, raising the possibility of gangrenous cholecystitis. No visible gallstones are seen.  There is associated right upper quadrant ascites. Findings discussed by telephone with Dr. Rogene Houston 11/11/2013 at 10:40 am. 2. Chronic stable extrahepatic and mild intrahepatic biliary ductal dilatation. 3. Moderate amount of stool in the colon and gaseous distention of the colon, without evidence of obstruction.   Electronically Signed   By: Curlene Dolphin M.D.   On: 11/11/2013 10:43     EKG Interpretation   Date/Time:  Wednesday Nov 11 2013 06:39:54 EDT Ventricular Rate:  80 PR Interval:  161 QRS Duration: 116 QT Interval:  351 QTC Calculation: 405 R Axis:   -57 Text Interpretation:  Sinus rhythm Ventricular premature complex  Nonspecific IVCD with LAD LVH with secondary repolarization abnormality  Anterior Q waves, possibly due to LVH Artifact New PVC's Confirmed by  Misty Foutz  MD, Jaxyn Rout [phone]) on 11/11/2013 8:29:53 AM      MDM   Final diagnoses:  Abdominal pain  Gallbladder gangrene    CT scan consistent with a gangrenous gallbladder. Patient with pain in that area for the past 2  weeks. Patient does have a hepatic and biliary changes that were old dating back to at least 2013. Discussed with general surgery. They want medicine admission. Patient has a history of bad COPD. Patient's satting well on 2 L nasal cannula oxygen. After patient was given some pain medicine she desatted and was placed on a face mask. Patient to back to nasal cannula now.  Patient with change in her white blood cell count from May 9 last time she was seen it was normal then now it's up to 14,000. Patient's liver function tests without significant abnormalities. Will contact the hospitalist patient is followed by [facility]    Mervin Kung, MD 11/11/13 1128

## 2013-11-12 DIAGNOSIS — R5381 Other malaise: Secondary | ICD-10-CM | POA: Diagnosis present

## 2013-11-12 DIAGNOSIS — E43 Unspecified severe protein-calorie malnutrition: Secondary | ICD-10-CM | POA: Diagnosis present

## 2013-11-12 DIAGNOSIS — I5032 Chronic diastolic (congestive) heart failure: Secondary | ICD-10-CM | POA: Diagnosis present

## 2013-11-12 LAB — CBC
HEMATOCRIT: 33 % — AB (ref 36.0–46.0)
Hemoglobin: 10.9 g/dL — ABNORMAL LOW (ref 12.0–15.0)
MCH: 33.1 pg (ref 26.0–34.0)
MCHC: 33 g/dL (ref 30.0–36.0)
MCV: 100.3 fL — AB (ref 78.0–100.0)
Platelets: 173 10*3/uL (ref 150–400)
RBC: 3.29 MIL/uL — AB (ref 3.87–5.11)
RDW: 12.6 % (ref 11.5–15.5)
WBC: 11.5 10*3/uL — AB (ref 4.0–10.5)

## 2013-11-12 LAB — BASIC METABOLIC PANEL
BUN: 23 mg/dL (ref 6–23)
CALCIUM: 10.4 mg/dL (ref 8.4–10.5)
CHLORIDE: 96 meq/L (ref 96–112)
CO2: 26 meq/L (ref 19–32)
CREATININE: 0.6 mg/dL (ref 0.50–1.10)
GFR calc Af Amer: >90 mL/min (ref >90)
GFR calc non Af Amer: 84 mL/min — ABNORMAL LOW (ref >90)
GLUCOSE: 97 mg/dL (ref 70–99)
Potassium: 4.6 mEq/L (ref 3.7–5.3)
Sodium: 135 mEq/L — ABNORMAL LOW (ref 137–147)

## 2013-11-12 MED ORDER — PANTOPRAZOLE SODIUM 40 MG PO TBEC
40.0000 mg | DELAYED_RELEASE_TABLET | Freq: Every day | ORAL | Status: DC
  Administered 2013-11-12 – 2013-11-16 (×5): 40 mg via ORAL
  Filled 2013-11-12 (×5): qty 1

## 2013-11-12 MED ORDER — TRAMADOL HCL 50 MG PO TABS
50.0000 mg | ORAL_TABLET | Freq: Four times a day (QID) | ORAL | Status: DC | PRN
  Administered 2013-11-12 – 2013-11-15 (×5): 50 mg via ORAL
  Filled 2013-11-12 (×5): qty 1

## 2013-11-12 MED ORDER — ENSURE COMPLETE PO LIQD
237.0000 mL | Freq: Three times a day (TID) | ORAL | Status: DC
  Administered 2013-11-12 – 2013-11-16 (×11): 237 mL via ORAL

## 2013-11-12 MED ORDER — VERAPAMIL HCL ER 120 MG PO TBCR
120.0000 mg | EXTENDED_RELEASE_TABLET | Freq: Every day | ORAL | Status: DC
  Administered 2013-11-12 – 2013-11-16 (×5): 120 mg via ORAL
  Filled 2013-11-12 (×6): qty 1

## 2013-11-12 MED ORDER — POLYETHYLENE GLYCOL 3350 17 G PO PACK
17.0000 g | PACK | Freq: Every day | ORAL | Status: DC
  Administered 2013-11-12 – 2013-11-16 (×5): 17 g via ORAL
  Filled 2013-11-12 (×5): qty 1

## 2013-11-12 MED ORDER — FENTANYL 50 MCG/HR TD PT72
50.0000 ug | MEDICATED_PATCH | TRANSDERMAL | Status: DC
  Administered 2013-11-14: 50 ug via TRANSDERMAL
  Filled 2013-11-12: qty 1

## 2013-11-12 MED ORDER — SERTRALINE HCL 50 MG PO TABS
50.0000 mg | ORAL_TABLET | Freq: Every day | ORAL | Status: DC
  Administered 2013-11-12 – 2013-11-16 (×5): 50 mg via ORAL
  Filled 2013-11-12 (×5): qty 1

## 2013-11-12 NOTE — Progress Notes (Signed)
Patient ID: WINNELL BENTO, female   DOB: [birth year], 80 y.o.   MRN: [id number]    Subjective: Pt is confused with normal conversation this morning.  She can tell me orientation, but has difficulty with normal conversations.  Still has pain, but better.  Cussed at me when I told she couldn't have regular food today.  Objective: Vital signs in last 24 hours: Temp:  [97.5 F (36.4 C)-98.8 F (37.1 C)] 97.5 F (36.4 C) (05/21 0549) Pulse Rate:  [62-98] 62 (05/21 0549) Resp:  [13-24] 16 (05/21 0549) BP: (97-138)/(34-78) 116/47 mmHg (05/21 0549) SpO2:  [86 %-100 %] 99 % (05/21 0549) Weight:  [131 lb 13.4 oz (59.8 kg)] 131 lb 13.4 oz (59.8 kg) (05/20 1345) Last BM Date: 11/11/13  Intake/Output from previous day: 05/20 0701 - 05/21 0700 In: -  Out: 850 [Urine:100; Drains:750] Intake/Output this shift:    PE: Abd: soft, still quite tender in RUQ, but better.  Perc chole drain in place with bilious output. Heart: regular  Lab Results:   Recent Labs  11/11/13 0755 11/12/13 0425  WBC 14.8* 11.5*  HGB 12.4 10.9*  HCT 36.7 33.0*  PLT 205 173   BMET  Recent Labs  11/11/13 0755 11/12/13 0425  NA 133* 135*  K 5.1 4.6  CL 94* 96  CO2 28 26  GLUCOSE 116* 97  BUN 26* 23  CREATININE 0.73 0.60  CALCIUM 11.2* 10.4   PT/INR  Recent Labs  11/11/13 1450  LABPROT 13.7  INR 1.07   CMP     Component Value Date/Time   NA 135* 11/12/2013 0425   K 4.6 11/12/2013 0425   CL 96 11/12/2013 0425   CO2 26 11/12/2013 0425   GLUCOSE 97 11/12/2013 0425   BUN 23 11/12/2013 0425   CREATININE 0.60 11/12/2013 0425   CALCIUM 10.4 11/12/2013 0425   PROT 7.0 11/11/2013 0755   ALBUMIN 3.5 11/11/2013 0755   AST 27 11/11/2013 0755   ALT 15 11/11/2013 0755   ALKPHOS 66 11/11/2013 0755   BILITOT 0.9 11/11/2013 0755   GFRNONAA 84* 11/12/2013 0425   GFRAA >90 11/12/2013 0425   Lipase     Component Value Date/Time   LIPASE 14 11/11/2013 0755       Studies/Results: Dg Chest 2 View  11/11/2013    CLINICAL DATA:  Short of breath, very weak  EXAM: CHEST  2 VIEW  COMPARISON:  Chest radiograph 03/05/2012  FINDINGS: Patient rotated rightward. Normal cardiac silhouette. No effusion, infiltrate, or pneumothorax. Aorta is ectatic. No acute osseous abnormality.  IMPRESSION: No active cardiopulmonary disease.   Electronically Signed   By: Suzy Bouchard M.D.   On: 11/11/2013 08:29   Ct Abdomen Pelvis W Contrast  11/11/2013   CLINICAL DATA:  Abdominal pain for 2 days. Nausea with recently diagnosed UTI  EXAM: CT ABDOMEN AND PELVIS WITH CONTRAST  TECHNIQUE: Multidetector CT imaging of the abdomen and pelvis was performed using the standard protocol following bolus administration of intravenous contrast.  CONTRAST:  43mL OMNIPAQUE IOHEXOL 300 MG/ML  SOLN  COMPARISON:  10/31/2013 CT abdomen pelvis with contrast and CT abdomen pelvis 03/06/2012, MRCP 02/03/2012.  FINDINGS: Lung bases: Stable areas of linear scarring, no consolidation or pleural effusion. New mild cardiomegaly.  The appearance of the gallbladder has significantly changed since the recent CT of 10/31/2013. The gallbladder is markedly distended, measuring up to 13.9 x 7.0 cm in axial dimension. The gallbladder wall appears markedly thickened, measuring up to approximately 1.9 cm. Portions  of the gallbladder wall mucosa markedly enhance, while others do not enhance at all, raising the possibility of a gangrenous mucosa. No calcified gallstones are visualized. There is new right upper quadrant ascites.  The patient has a chronic mild intrahepatic biliary ductal dilatation and chronic extrahepatic biliary ductal dilatation. Common bile duct measures up to 1.3 cm, unchanged in measurement from an MRCP performed February 03, 2012.  No focal hepatic lesion is identified. Pancreas divisum again noted. No pancreatic mass or calcification is identified. No acute findings associated with the pancreas.  Stable thickening of the right adrenal gland, measured 1.4 cm  transverse dimension. Normal left adrenal gland.  There is a moderate amount of stool in the colon and gaseous distention of the colon without evidence of obstruction. Right posterior wall bladder diverticulum measures approximately 3 cm diameter. Urinary bladder wall thickness is normal.  Stable mild scarring and cortical thinning of the kidneys, left greater than right. Negative for hydronephrosis.  Abdominal aorta demonstrates extensive atherosclerotic changes without aneurysm. Hysterectomy. No adnexal mass. Negative for lymphadenopathy. Negative for free air.  Chronic degenerative disc disease of the lumbar spine. No acute osseous abnormality.  IMPRESSION: 1. Acute cholecystitis. There is marked wall thickening. Some of the gallbladder mucosa enhances markedly while other areas do not enhance at all, raising the possibility of gangrenous cholecystitis. No visible gallstones are seen. There is associated right upper quadrant ascites. Findings discussed by telephone with Dr. Rogene Houston 11/11/2013 at 10:40 am. 2. Chronic stable extrahepatic and mild intrahepatic biliary ductal dilatation. 3. Moderate amount of stool in the colon and gaseous distention of the colon, without evidence of obstruction.   Electronically Signed   By: Curlene Dolphin M.D.   On: 11/11/2013 10:43   Ir Perc Cholecystostomy  11/11/2013   INDICATION: Acute cholecystitis, poor surgical candidate  EXAM: ULTRASOUND AND FLUOROSCOPIC-GUIDED CHOLECYSTOSTOMY TUBE PLACEMENT  COMPARISON:  CT abdomen pelvis -11/11/2013  MEDICATIONS: Fentanyl 75 mcg IV; Versed 1.5 mg IV; The patient is currently admitted to the hospital and on intravenous antibiotics. Antibiotics were administered within an appropriate time frame prior to skin puncture.  ANESTHESIA/SEDATION: Total Moderate Sedation Time  20 minutes  CONTRAST:  29mL OMNIPAQUE IOHEXOL 300 MG/ML  SOLN  FLUOROSCOPY TIME:  48 seconds  COMPLICATIONS: None immediate.  PROCEDURE: Informed written consent was  obtained from the patient after a discussion of the risks, benefits and alternatives to treatment. Questions regarding the procedure were encouraged and answered. A timeout was performed prior to the initiation of the procedure.  The right upper abdominal quadrant was prepped and draped in the usual sterile fashion, and a sterile drape was applied covering the operative field. Maximum barrier sterile technique with sterile gowns and gloves were used for the procedure. A timeout was performed prior to the initiation of the procedure. Local anesthesia was provided with 1% lidocaine with epinephrine.  Ultrasound scanning of the right upper quadrant demonstrates a markedly dilated gallbladder. Utilizing a transhepatic approach, a 22 gauge needle was advanced into the gallbladder under direct ultrasound guidance. An ultrasound image was saved for documentation purposes. Appropriate intraluminal puncture was confirmed with the efflux of bile and advancement of an 0.018 wire into the gallbladder lumen. The needle was exchanged for an [place] set. A small amount of contrast was injected to confirm appropriate intraluminal positioning. Over a Benson wire, a 32.2-French Cook cholecystomy tube was advanced into the gallbladder fossa, coiled and locked. Bile was aspirated and a small amount of contrast was injected as several post procedural  spot radiographic images were obtained in various obliquities. The catheter was secured to the skin with suture, connected to a drainage bag and a dressing was placed. The patient tolerated the procedure well without immediate post procedural complication.  IMPRESSION: Successful ultrasound and fluoroscopic guided placement of a 10.2 French cholecystostomy tube.   Electronically Signed   By: Sandi Mariscal M.D.   On: 11/11/2013 17:16    Anti-infectives: Anti-infectives   Start     Dose/Rate Route Frequency Ordered Stop   11/11/13 1600  piperacillin-tazobactam (ZOSYN) IVPB 3.375 g      3.375 g 12.5 mL/hr over 240 Minutes Intravenous 3 times per day 11/11/13 1423         Assessment/Plan  1. Acute cholecystitis, s/p perc drain 2. COPD  Plan: 1. Patient looks better today than yesterday, but it appears baseline mental status may be confusion. 2. Was going to give her clears, but appears Dr. Lysle Rubens has given her full liquids.  Would keep her here today and not advance past this yet. 3. Cont IV abx therapy   LOS: 1 day    Henreitta Cea 11/12/2013, 9:07 AM Pager: [phone]

## 2013-11-12 NOTE — Progress Notes (Signed)
Subjective: C/o some nausea; pain better   Objective: Vital signs in last 24 hours: Temp:  [97.5 F (36.4 C)-98.8 F (37.1 C)] 97.5 F (36.4 C) (05/21 0549) Pulse Rate:  [62-98] 62 (05/21 0549) Resp:  [13-24] 16 (05/21 0549) BP: (97-138)/(34-78) 116/47 mmHg (05/21 0549) SpO2:  [86 %-100 %] 99 % (05/21 0549) Weight:  [59.8 kg (131 lb 13.4 oz)] 59.8 kg (131 lb 13.4 oz) (05/20 1345) Weight change:  Last BM Date: 11/11/13  Intake/Output from previous day: 05/20 0701 - 05/21 0700 In: -  Out: 850 [Urine:100; Drains:750] Intake/Output this shift:    General appearance: alert Resp: clear to auscultation bilaterally Cardio: regular rate and rhythm GI: soft, non-tender; bowel sounds normal; no masses,  no organomegaly  Lab Results:  Recent Labs  11/11/13 0755 11/12/13 0425  WBC 14.8* 11.5*  HGB 12.4 10.9*  HCT 36.7 33.0*  PLT 205 173   BMET  Recent Labs  11/11/13 0755 11/12/13 0425  NA 133* 135*  K 5.1 4.6  CL 94* 96  CO2 28 26  GLUCOSE 116* 97  BUN 26* 23  CREATININE 0.73 0.60  CALCIUM 11.2* 10.4    Studies/Results: Dg Chest 2 View  11/11/2013   CLINICAL DATA:  Short of breath, very weak  EXAM: CHEST  2 VIEW  COMPARISON:  Chest radiograph 03/05/2012  FINDINGS: Patient rotated rightward. Normal cardiac silhouette. No effusion, infiltrate, or pneumothorax. Aorta is ectatic. No acute osseous abnormality.  IMPRESSION: No active cardiopulmonary disease.   Electronically Signed   By: Suzy Bouchard M.D.   On: 11/11/2013 08:29   Ct Abdomen Pelvis W Contrast  11/11/2013   CLINICAL DATA:  Abdominal pain for 2 days. Nausea with recently diagnosed UTI  EXAM: CT ABDOMEN AND PELVIS WITH CONTRAST  TECHNIQUE: Multidetector CT imaging of the abdomen and pelvis was performed using the standard protocol following bolus administration of intravenous contrast.  CONTRAST:  19mL OMNIPAQUE IOHEXOL 300 MG/ML  SOLN  COMPARISON:  10/31/2013 CT abdomen pelvis with contrast and CT abdomen  pelvis 03/06/2012, MRCP 02/03/2012.  FINDINGS: Lung bases: Stable areas of linear scarring, no consolidation or pleural effusion. New mild cardiomegaly.  The appearance of the gallbladder has significantly changed since the recent CT of 10/31/2013. The gallbladder is markedly distended, measuring up to 13.9 x 7.0 cm in axial dimension. The gallbladder wall appears markedly thickened, measuring up to approximately 1.9 cm. Portions of the gallbladder wall mucosa markedly enhance, while others do not enhance at all, raising the possibility of a gangrenous mucosa. No calcified gallstones are visualized. There is new right upper quadrant ascites.  The patient has a chronic mild intrahepatic biliary ductal dilatation and chronic extrahepatic biliary ductal dilatation. Common bile duct measures up to 1.3 cm, unchanged in measurement from an MRCP performed February 03, 2012.  No focal hepatic lesion is identified. Pancreas divisum again noted. No pancreatic mass or calcification is identified. No acute findings associated with the pancreas.  Stable thickening of the right adrenal gland, measured 1.4 cm transverse dimension. Normal left adrenal gland.  There is a moderate amount of stool in the colon and gaseous distention of the colon without evidence of obstruction. Right posterior wall bladder diverticulum measures approximately 3 cm diameter. Urinary bladder wall thickness is normal.  Stable mild scarring and cortical thinning of the kidneys, left greater than right. Negative for hydronephrosis.  Abdominal aorta demonstrates extensive atherosclerotic changes without aneurysm. Hysterectomy. No adnexal mass. Negative for lymphadenopathy. Negative for free air.  Chronic degenerative  disc disease of the lumbar spine. No acute osseous abnormality.  IMPRESSION: 1. Acute cholecystitis. There is marked wall thickening. Some of the gallbladder mucosa enhances markedly while other areas do not enhance at all, raising the possibility  of gangrenous cholecystitis. No visible gallstones are seen. There is associated right upper quadrant ascites. Findings discussed by telephone with Dr. Rogene Houston 11/11/2013 at 10:40 am. 2. Chronic stable extrahepatic and mild intrahepatic biliary ductal dilatation. 3. Moderate amount of stool in the colon and gaseous distention of the colon, without evidence of obstruction.   Electronically Signed   By: Curlene Dolphin M.D.   On: 11/11/2013 10:43   Ir Perc Cholecystostomy  11/11/2013   INDICATION: Acute cholecystitis, poor surgical candidate  EXAM: ULTRASOUND AND FLUOROSCOPIC-GUIDED CHOLECYSTOSTOMY TUBE PLACEMENT  COMPARISON:  CT abdomen pelvis -11/11/2013  MEDICATIONS: Fentanyl 75 mcg IV; Versed 1.5 mg IV; The patient is currently admitted to the hospital and on intravenous antibiotics. Antibiotics were administered within an appropriate time frame prior to skin puncture.  ANESTHESIA/SEDATION: Total Moderate Sedation Time  20 minutes  CONTRAST:  82mL OMNIPAQUE IOHEXOL 300 MG/ML  SOLN  FLUOROSCOPY TIME:  48 seconds  COMPLICATIONS: None immediate.  PROCEDURE: Informed written consent was obtained from the patient after a discussion of the risks, benefits and alternatives to treatment. Questions regarding the procedure were encouraged and answered. A timeout was performed prior to the initiation of the procedure.  The right upper abdominal quadrant was prepped and draped in the usual sterile fashion, and a sterile drape was applied covering the operative field. Maximum barrier sterile technique with sterile gowns and gloves were used for the procedure. A timeout was performed prior to the initiation of the procedure. Local anesthesia was provided with 1% lidocaine with epinephrine.  Ultrasound scanning of the right upper quadrant demonstrates a markedly dilated gallbladder. Utilizing a transhepatic approach, a 22 gauge needle was advanced into the gallbladder under direct ultrasound guidance. An ultrasound image was  saved for documentation purposes. Appropriate intraluminal puncture was confirmed with the efflux of bile and advancement of an 0.018 wire into the gallbladder lumen. The needle was exchanged for an [place] set. A small amount of contrast was injected to confirm appropriate intraluminal positioning. Over a Benson wire, a 73.2-French Cook cholecystomy tube was advanced into the gallbladder fossa, coiled and locked. Bile was aspirated and a small amount of contrast was injected as several post procedural spot radiographic images were obtained in various obliquities. The catheter was secured to the skin with suture, connected to a drainage bag and a dressing was placed. The patient tolerated the procedure well without immediate post procedural complication.  IMPRESSION: Successful ultrasound and fluoroscopic guided placement of a 10.2 French cholecystostomy tube.   Electronically Signed   By: Sandi Mariscal M.D.   On: 11/11/2013 17:16    Medications: I have reviewed the patient's current medications.  Assessment/Plan: Acute Cholecystitis/ gangrene gallbladder: s/p Percutaneous drain - with improvement: Continue IV ABX - surgery f/u, pain control. Nausea control Leukocytosis  Better COPD continue current meds Chronic pain syndrome: continue Fentanyl patch ( was on 75 mcg at home) ; tramadol prn  H/o chronic UTI: on ABX Diastolic CHF- clinically stable- lasix on hold H/o MAT: verapamil daily Breat cancer with right lymphedema chronic stable Moderate Malnutrion protein: nutrition consult Weakness / decondtioning: PT consult- benefit from SNF- short term Tobacco use- use nicotine patch Major depression/ anxiety- zoloft.  LOS: 1 day   Wenda Low 11/12/2013, 7:09 AM

## 2013-11-12 NOTE — Progress Notes (Signed)
INITIAL NUTRITION ASSESSMENT  Pt meets criteria for SEVERE MALNUTRITION in the context of chronic illness as evidenced by 25% weight loss in < 1 year and severe muscle and fat depeltion.  DOCUMENTATION CODES Per approved criteria  -Severe malnutrition in the context of chronic illness   INTERVENTION: Ensure Complete po TID, each supplement provides 350 kcal and 13 grams of protein  NUTRITION DIAGNOSIS: Increased nutrient needs related to gangrene as evidenced by estimated needs.   Goal: Pt to meet >/= 90% of their estimated nutrition needs   Monitor:  Diet advancement, supplement acceptance, weight trend, labs  Reason for Assessment: MD Consult  80 y.o. female  Admitting Dx: Gallbladder gangrene  ASSESSMENT: Pt admitted with abdominal pain and nausea. Per H&P pt has had nausea with food intake x 2 months, with increase in pain 2 weeks ago. Pt is now s/p guided placement of 10 F percutaneous cholecystotomy tube 5/20 for acute cholecystitis/gangrene gallbladder.  Pt has baseline confusion. Full Liquids started today.  Neighbor at bedside and confirms dramatic weight loss, mostly in the last 6 months.  Per neighbor pt lives with grandson who is not present at this time. Unable to determine intake history.   Nutrition Focused Physical Exam:  Subcutaneous Fat:  Orbital Region: WNL Upper Arm Region: WNL Thoracic and Lumbar Region: severe wasting  Muscle:  Temple Region: severe wasting Clavicle Bone Region: severe wasting Clavicle and Acromion Bone Region: severe wasting Scapular Bone Region: severe wasting Dorsal Hand: severe wasting Patellar Region: severe wasting Anterior Thigh Region: severe wasting Posterior Calf Region: mild wasting  Edema: not present   Height: Ht Readings from Last 1 Encounters:  04/03/12 5\' 7"  (1.702 m)    Weight: Wt Readings from Last 1 Encounters:  11/11/13 131 lb 13.4 oz (59.8 kg)    Ideal Body Weight: 61.3 kg   % Ideal Body  Weight: 98%  Wt Readings from Last 10 Encounters:  11/11/13 131 lb 13.4 oz (59.8 kg)  04/03/12 175 lb (79.379 kg)  04/03/12 175 lb (79.379 kg)  03/10/12 156 lb 1.4 oz (70.8 kg)  03/04/12 153 lb (69.4 kg)  02/05/12 167 lb 8.8 oz (76 kg)  06/10/11 179 lb 3.2 oz (81.285 kg)    Usual Body Weight: 175 lb   % Usual Body Weight: 75%  BMI:  Body mass index is 20.64 kg/(m^2).  Estimated Nutritional Needs: Kcal: 1500-1700 Protein: 70-85 grams Fluid: > 1.5 L/day  Skin: no issues noted  Diet Order: Full Liquid Meal Completion: 75%  EDUCATION NEEDS: -No education needs identified at this time   Intake/Output Summary (Last 24 hours) at 11/12/13 1022 Last data filed at 11/12/13 0549  Gross per 24 hour  Intake      0 ml  Output    850 ml  Net   -850 ml    Last BM: 5/20   Labs:   Recent Labs Lab 11/11/13 0755 11/12/13 0425  NA 133* 135*  K 5.1 4.6  CL 94* 96  CO2 28 26  BUN 26* 23  CREATININE 0.73 0.60  CALCIUM 11.2* 10.4  GLUCOSE 116* 97    CBG (last 3)   Recent Labs  11/11/13 2242  GLUCAP 100*    Scheduled Meds: . budesonide (PULMICORT) nebulizer solution  0.25 mg Nebulization BID  . [START ON 11/14/2013] fentaNYL  50 mcg Transdermal Q72H  . nicotine  14 mg Transdermal Daily  . pantoprazole  40 mg Oral Daily  . piperacillin-tazobactam (ZOSYN)  IV  3.375 g  Intravenous 3 times per day  . polyethylene glycol  17 g Oral Daily  . sertraline  50 mg Oral Daily  . tiotropium  18 mcg Inhalation Daily  . verapamil  120 mg Oral Daily    Continuous Infusions:   Past Medical History  Diagnosis Date  . Anxiety   . Smoking   . Fibromyalgia   . Depression   . Hyperlipidemia   . Hypercalcemia   . Adrenal adenoma     Stable as of 05/2012 CT scan  . Chronic respiratory failure   . Coronary artery disease   . GERD (gastroesophageal reflux disease)   . Lymphedema syndrome, postmastectomy     RUE  . Breast CA 1970    rt breast  . CHF (congestive heart  failure)   . COPD (chronic obstructive pulmonary disease)   . On home oxygen therapy     "3L; 24/7" (11/11/2013)  . DJD (degenerative joint disease)   . Arthritis     "joints" (11/11/2013)  . Chronic back pain     "shoulders to lower back" (11/11/2013)  . Frequent UTI     Past Surgical History  Procedure Laterality Date  . Abdominal hysterectomy    . Appendectomy    . Squamous cell carcinoma excision      Floor of mouth  . Colonoscopy w/ biopsies and polypectomy    . Nasal sinus surgery  1994  . Eus  04/03/2012    Procedure: UPPER ENDOSCOPIC ULTRASOUND (EUS) LINEAR;  Surgeon: Milus Banister, MD;  Location: [facility];  Service: Endoscopy;  Laterality: N/A;  radial linear  . Mastectomy, radical Right 1970  . Breast biopsy Right ~ 1970  . Refractive surgery Bilateral ~ 2012  . Back surgery    . Posterior lumbar fusion  ?[place], [place], [place] Pager [phone] After Hours Pager

## 2013-11-12 NOTE — Progress Notes (Signed)
Patient mildly confused Using incentive spirometer.  Abdomen softer Drain - bilious drainage  [name]. Georgette Dover, MD, [facility]  General/ Trauma Surgery  11/12/2013 3:43 PM

## 2013-11-12 NOTE — Clinical Social Work Note (Signed)
CSW consulted for possible SNF placement at time of discharge. CSW now following pt's chart for discharge disposition. Thank you for the referral.  Pati Gallo, [place] Social Worker [phone]

## 2013-11-12 NOTE — Evaluation (Signed)
Physical Therapy Evaluation Patient Details Name: Theresa Blackwell MRN: [id number] DOB: [birth year] Today's Date: 11/12/2013   History of Present Illness  80 yo female admitted 11/11/13 with abdominal pain, found to have gallbladder gangrene. S/P percutaneous drain.  Clinical Impression  Pt unable to focus on mobility and activity, extra time required to  Get pt to get up to side of bed, then required  Assistance to get back into bed due to pt attempting to walk away from the bed with multiple lines. Pt unable to reason need to return to bed.. Pt will benefit from PT while in acute care to address problems listed below.     Follow Up Recommendations SNF;Supervision/Assistance - 24 hour    Equipment Recommendations  None recommended by PT    Recommendations for Other Services       Precautions / Restrictions Precautions Precautions: Fall Precaution Comments: R drain, confused,       Mobility  Bed Mobility Overal bed mobility: Needs Assistance Bed Mobility: Supine to Sit;Sit to Supine     Supine to sit: Min guard Sit to supine: Min guard   General bed mobility comments: tactile cues due to pt not following directions, not able to focua on activity.   Transfers Overall transfer level: Needs assistance Equipment used: Rolling walker (2 wheeled) Transfers: Sit to/from Stand Sit to Stand: Mod assist         General transfer comment: pt stood from bed, tactile cues required to sit back down as pt was trying to walk away from bed, multiple lines and tubes attatched. Pt not aware of risk.   Ambulation/Gait                Stairs            Wheelchair Mobility    Modified Rankin (Stroke Patients Only)       Balance Overall balance assessment: Needs assistance Sitting-balance support: No upper extremity supported;Feet supported Sitting balance-Leahy Scale: Fair Sitting balance - Comments: able to reach feet to pull up socks   Standing balance support:  Bilateral upper extremity supported;During functional activity Standing balance-Leahy Scale: Poor Standing balance comment: due to impulsivity and not following commands for safety                             Pertinent Vitals/Pain Indicates abdomen is uncomfortable.    Home Living Family/patient expects to be discharged to:: Skilled nursing facility                      Prior Function                 Hand Dominance        Extremity/Trunk Assessment   Upper Extremity Assessment: Overall WFL for tasks assessed           Lower Extremity Assessment: Generalized weakness      Cervical / Trunk Assessment: Normal  Communication      Cognition Arousal/Alertness: Awake/alert Behavior During Therapy: Restless;Agitated;Impulsive Overall Cognitive Status: Impaired/Different from baseline Area of Impairment: Orientation;Attention;Following commands;Safety/judgement;Awareness;Problem solving Orientation Level: Place;Time;Situation Current Attention Level: Divided   Following Commands: Follows one step commands inconsistently Safety/Judgement: Decreased awareness of safety   Problem Solving: Requires verbal cues;Requires tactile cues General Comments: pt constantly fidgeting with drain, covers    General Comments      Exercises        Assessment/Plan    PT Assessment Patient  needs continued PT services  PT Diagnosis Acute pain;Altered mental status   PT Problem List Decreased activity tolerance;Decreased mobility;Decreased cognition;Decreased safety awareness;Decreased knowledge of precautions;Pain  PT Treatment Interventions DME instruction;Gait training;Functional mobility training;Therapeutic activities;Therapeutic exercise;Cognitive remediation;Patient/family education   PT Goals (Current goals can be found in the Care Plan section) Acute Rehab PT Goals Patient Stated Goal: not stated PT Goal Formulation: Patient unable to participate in  goal setting (family left room) Time For Goal Achievement: 11/26/13 Potential to Achieve Goals: Good    Frequency Min 3X/week   Barriers to discharge        Co-evaluation               End of Session   Activity Tolerance: Treatment limited secondary to agitation Patient left: in bed;with call bell/phone within reach;with bed alarm set;with family/visitor present Nurse Communication: Mobility status         Time: 2119-4174 PT Time Calculation (min): 26 min   Charges:   PT Evaluation $Initial PT Evaluation Tier I: 1 Procedure PT Treatments $Therapeutic Activity: 23-37 mins   PT G Codes:          Theresa Blackwell 11/12/2013, 2:02 PM

## 2013-11-13 ENCOUNTER — Other Ambulatory Visit: Payer: Self-pay | Admitting: Radiology

## 2013-11-13 LAB — COMPREHENSIVE METABOLIC PANEL
ALT: 64 U/L — ABNORMAL HIGH (ref 0–35)
AST: 30 U/L (ref 0–37)
Albumin: 2.7 g/dL — ABNORMAL LOW (ref 3.5–5.2)
Alkaline Phosphatase: 102 U/L (ref 39–117)
BILIRUBIN TOTAL: 0.6 mg/dL (ref 0.3–1.2)
BUN: 17 mg/dL (ref 6–23)
CALCIUM: 9.9 mg/dL (ref 8.4–10.5)
CHLORIDE: 96 meq/L (ref 96–112)
CO2: 28 meq/L (ref 19–32)
CREATININE: 0.57 mg/dL (ref 0.50–1.10)
GFR, EST NON AFRICAN AMERICAN: 85 mL/min — AB (ref >90)
GLUCOSE: 97 mg/dL (ref 70–99)
Potassium: 4.1 mEq/L (ref 3.7–5.3)
Sodium: 135 mEq/L — ABNORMAL LOW (ref 137–147)
Total Protein: 6 g/dL (ref 6.0–8.3)

## 2013-11-13 LAB — URINE CULTURE: Colony Count: 30000

## 2013-11-13 LAB — CBC
HCT: 30.4 % — ABNORMAL LOW (ref 36.0–46.0)
Hemoglobin: 9.9 g/dL — ABNORMAL LOW (ref 12.0–15.0)
MCH: 32.8 pg (ref 26.0–34.0)
MCHC: 32.6 g/dL (ref 30.0–36.0)
MCV: 100.7 fL — AB (ref 78.0–100.0)
Platelets: 199 10*3/uL (ref 150–400)
RBC: 3.02 MIL/uL — AB (ref 3.87–5.11)
RDW: 12.5 % (ref 11.5–15.5)
WBC: 7.4 10*3/uL (ref 4.0–10.5)

## 2013-11-13 MED ORDER — FUROSEMIDE 20 MG PO TABS: 20.0000 mg | ORAL_TABLET | Freq: Every day | ORAL | Status: DC

## 2013-11-13 MED ORDER — FUROSEMIDE 20 MG PO TABS
20.0000 mg | ORAL_TABLET | Freq: Every day | ORAL | Status: DC
  Administered 2013-11-13 – 2013-11-16 (×4): 20 mg via ORAL
  Filled 2013-11-13 (×4): qty 1

## 2013-11-13 MED ORDER — POTASSIUM CHLORIDE CRYS ER 10 MEQ PO TBCR: 10.0000 meq | EXTENDED_RELEASE_TABLET | Freq: Every day | ORAL | Status: DC

## 2013-11-13 MED ORDER — TRAMADOL HCL 50 MG PO TABS: 50.0000 mg | ORAL_TABLET | Freq: Four times a day (QID) | ORAL | Status: DC | PRN

## 2013-11-13 MED ORDER — SERTRALINE HCL 50 MG PO TABS: 50.0000 mg | ORAL_TABLET | Freq: Every day | ORAL | Status: AC

## 2013-11-13 MED ORDER — FENTANYL 50 MCG/HR TD PT72: 50.0000 ug | MEDICATED_PATCH | TRANSDERMAL | Status: DC

## 2013-11-13 MED ORDER — AMOXICILLIN-POT CLAVULANATE 875-125 MG PO TABS: 1.0000 | ORAL_TABLET | Freq: Two times a day (BID) | ORAL | Status: AC

## 2013-11-13 MED ORDER — POTASSIUM CHLORIDE CRYS ER 10 MEQ PO TBCR
10.0000 meq | EXTENDED_RELEASE_TABLET | Freq: Once | ORAL | Status: AC
  Administered 2013-11-13: 10 meq via ORAL
  Filled 2013-11-13: qty 1

## 2013-11-13 MED ORDER — VERAPAMIL HCL ER 120 MG PO TBCR: 120.0000 mg | EXTENDED_RELEASE_TABLET | Freq: Every day | ORAL | Status: AC

## 2013-11-13 NOTE — Progress Notes (Signed)
Subjective: Patient with no complaints today.  Objective: Physical Exam: BP 124/64  Pulse 63  Temp(Src) 98.1 F (36.7 C) (Oral)  Resp 16  Ht 5\' 7"  (1.702 m)  Wt 138 lb (62.596 kg)  BMI 21.61 kg/m2  SpO2 99%  General: Alert and pleasant today. Abd: Soft, RUQ chole drain dressing C/D/I, thin bile output, 24 hr 425 cc  Labs: CBC  Recent Labs  11/12/13 0425 11/13/13 0555  WBC 11.5* 7.4  HGB 10.9* 9.9*  HCT 33.0* 30.4*  PLT 173 199   BMET  Recent Labs  11/12/13 0425 11/13/13 0555  NA 135* 135*  K 4.6 4.1  CL 96 96  CO2 26 28  GLUCOSE 97 97  BUN 23 17  CREATININE 0.60 0.57  CALCIUM 10.4 9.9   LFT  Recent Labs  11/11/13 0755 11/13/13 0555  PROT 7.0 6.0  ALBUMIN 3.5 2.7*  AST 27 30  ALT 15 64*  ALKPHOS 66 102  BILITOT 0.9 0.6  LIPASE 14  --    PT/INR  Recent Labs  11/11/13 1450  LABPROT 13.7  INR 1.07     Studies/Results: Ct Abdomen Pelvis W Contrast  11/11/2013   CLINICAL DATA:  Abdominal pain for 2 days. Nausea with recently diagnosed UTI  EXAM: CT ABDOMEN AND PELVIS WITH CONTRAST  TECHNIQUE: Multidetector CT imaging of the abdomen and pelvis was performed using the standard protocol following bolus administration of intravenous contrast.  CONTRAST:  14mL OMNIPAQUE IOHEXOL 300 MG/ML  SOLN  COMPARISON:  10/31/2013 CT abdomen pelvis with contrast and CT abdomen pelvis 03/06/2012, MRCP 02/03/2012.  FINDINGS: Lung bases: Stable areas of linear scarring, no consolidation or pleural effusion. New mild cardiomegaly.  The appearance of the gallbladder has significantly changed since the recent CT of 10/31/2013. The gallbladder is markedly distended, measuring up to 13.9 x 7.0 cm in axial dimension. The gallbladder wall appears markedly thickened, measuring up to approximately 1.9 cm. Portions of the gallbladder wall mucosa markedly enhance, while others do not enhance at all, raising the possibility of a gangrenous mucosa. No calcified gallstones are  visualized. There is new right upper quadrant ascites.  The patient has a chronic mild intrahepatic biliary ductal dilatation and chronic extrahepatic biliary ductal dilatation. Common bile duct measures up to 1.3 cm, unchanged in measurement from an MRCP performed February 03, 2012.  No focal hepatic lesion is identified. Pancreas divisum again noted. No pancreatic mass or calcification is identified. No acute findings associated with the pancreas.  Stable thickening of the right adrenal gland, measured 1.4 cm transverse dimension. Normal left adrenal gland.  There is a moderate amount of stool in the colon and gaseous distention of the colon without evidence of obstruction. Right posterior wall bladder diverticulum measures approximately 3 cm diameter. Urinary bladder wall thickness is normal.  Stable mild scarring and cortical thinning of the kidneys, left greater than right. Negative for hydronephrosis.  Abdominal aorta demonstrates extensive atherosclerotic changes without aneurysm. Hysterectomy. No adnexal mass. Negative for lymphadenopathy. Negative for free air.  Chronic degenerative disc disease of the lumbar spine. No acute osseous abnormality.  IMPRESSION: 1. Acute cholecystitis. There is marked wall thickening. Some of the gallbladder mucosa enhances markedly while other areas do not enhance at all, raising the possibility of gangrenous cholecystitis. No visible gallstones are seen. There is associated right upper quadrant ascites. Findings discussed by telephone with Dr. Rogene Houston 11/11/2013 at 10:40 am. 2. Chronic stable extrahepatic and mild intrahepatic biliary ductal dilatation. 3. Moderate amount of stool  in the colon and gaseous distention of the colon, without evidence of obstruction.   Electronically Signed   By: Curlene Dolphin M.D.   On: 11/11/2013 10:43   Ir Perc Cholecystostomy  11/11/2013   INDICATION: Acute cholecystitis, poor surgical candidate  EXAM: ULTRASOUND AND FLUOROSCOPIC-GUIDED  CHOLECYSTOSTOMY TUBE PLACEMENT  COMPARISON:  CT abdomen pelvis -11/11/2013  MEDICATIONS: Fentanyl 75 mcg IV; Versed 1.5 mg IV; The patient is currently admitted to the hospital and on intravenous antibiotics. Antibiotics were administered within an appropriate time frame prior to skin puncture.  ANESTHESIA/SEDATION: Total Moderate Sedation Time  20 minutes  CONTRAST:  2mL OMNIPAQUE IOHEXOL 300 MG/ML  SOLN  FLUOROSCOPY TIME:  48 seconds  COMPLICATIONS: None immediate.  PROCEDURE: Informed written consent was obtained from the patient after a discussion of the risks, benefits and alternatives to treatment. Questions regarding the procedure were encouraged and answered. A timeout was performed prior to the initiation of the procedure.  The right upper abdominal quadrant was prepped and draped in the usual sterile fashion, and a sterile drape was applied covering the operative field. Maximum barrier sterile technique with sterile gowns and gloves were used for the procedure. A timeout was performed prior to the initiation of the procedure. Local anesthesia was provided with 1% lidocaine with epinephrine.  Ultrasound scanning of the right upper quadrant demonstrates a markedly dilated gallbladder. Utilizing a transhepatic approach, a 22 gauge needle was advanced into the gallbladder under direct ultrasound guidance. An ultrasound image was saved for documentation purposes. Appropriate intraluminal puncture was confirmed with the efflux of bile and advancement of an 0.018 wire into the gallbladder lumen. The needle was exchanged for an [place] set. A small amount of contrast was injected to confirm appropriate intraluminal positioning. Over a Benson wire, a 19.2-French Cook cholecystomy tube was advanced into the gallbladder fossa, coiled and locked. Bile was aspirated and a small amount of contrast was injected as several post procedural spot radiographic images were obtained in various obliquities. The catheter was  secured to the skin with suture, connected to a drainage bag and a dressing was placed. The patient tolerated the procedure well without immediate post procedural complication.  IMPRESSION: Successful ultrasound and fluoroscopic guided placement of a 10.2 French cholecystostomy tube.   Electronically Signed   By: Sandi Mariscal M.D.   On: 11/11/2013 17:16    Assessment/Plan: Acute gangrenous cholecystitis on IV antibiotics, wbc WNL and afebrile.  S/p perc 61F cholecystostomy placement 5/20 Good thin bile output, plan for discharge 5/23, follow up with [facility] in 2 weeks per chart review.   LOS: 2 days    Hedy Jacob PA-C 11/13/2013 10:09 AM

## 2013-11-13 NOTE — Clinical Social Work Psychosocial (Signed)
Clinical Social Work Department BRIEF PSYCHOSOCIAL ASSESSMENT 11/13/2013  Patient:  Theresa Blackwell, Theresa Blackwell     Account Number:  [id number]     Admit date:  11/11/2013  Clinical Social Worker:  Donna Christen  Date/Time:  11/13/2013 10:27 AM  Referred by:  Physician  Date Referred:  11/13/2013 Referred for  SNF Placement   Other Referral:   none.   Interview type:  Patient Other interview type:   Pt's grandson, Rachel Bo, also present at bedside.    PSYCHOSOCIAL DATA Living Status:  ALONE Admitted from facility:   Level of care:   Primary support name:  Merdis Delay Primary support relationship to patient:  CHILD, ADULT Degree of support available:   Strong support system. Pt spoke positively regarding her family support from her son, grandchildren, and community.    CURRENT CONCERNS Current Concerns  Post-Acute Placement   Other Concerns:   none.    SOCIAL WORK ASSESSMENT / PLAN CSW received consult for SNF placement at time of discharge. CSW met with pt and pt's grandson, Rachel Bo, at bedside. Pt and pt's grandson agreeable to SNF placement at time of discharge. Pt stated she would prefer [place] area for SNF placement. Pt's grandson stated pt has previously received therapy services at [place]. CSW to continue to follow and assist with discharge planning needs.   Assessment/plan status:  Psychosocial Support/Ongoing Assessment of Needs Other assessment/ plan:   none.   Information/referral to community resources:   [facility] SNF bed offers.    PATIENTS/FAMILYS RESPONSE TO PLAN OF CARE: Pt and pt's grandson very understanding and agreeable to CSW plan of care.       Pati Gallo, [place] Social Worker [phone]

## 2013-11-13 NOTE — Progress Notes (Addendum)
Subjective: Stable and alert. Grandson in room.   Feeble and deconditioned.Poor memory for recent events but cooperative, conversant, and not agitated. Pain seems reasonably well-controlled.No apparent nausea or vomiting.  Afebrile. Heart rate 75. Normotensive.Good urine output. Gallbladder drain functioning well.  Objective: Vital signs in last 24 hours: Temp:  [97.7 F (36.5 C)-98.2 F (36.8 C)] 98.1 F (36.7 C) (05/22 0604) Pulse Rate:  [55-75] 63 (05/22 0604) Resp:  [16-17] 16 (05/22 0604) BP: (108-126)/(56-65) 124/64 mmHg (05/22 0604) SpO2:  [91 %-100 %] 99 % (05/22 0604) Weight:  [131 lb 13.4 oz (59.8 kg)-138 lb (62.596 kg)] 138 lb (62.596 kg) (05/22 0604) Last BM Date: 11/11/13  Intake/Output from previous day: 05/21 0701 - 05/22 0700 In: 1140 [P.O.:840; IV Piggyback:300] Out: 725 [Urine:300; Drains:425] Intake/Output this shift:     EXAM: General appearance: alert and carries on normal conversation. Poor memory for events. Insight fair at best. Elderly. Febrile. Deconditioned. GI: abdomen is soft without guarding but still a little tender in the right upper quadrant. No signs of peritoneal inflammation. No mass. Drain in place with clear bilious output. No purulence.  Lab Results:   Recent Labs  11/12/13 0425 11/13/13 0555  WBC 11.5* 7.4  HGB 10.9* 9.9*  HCT 33.0* 30.4*  PLT 173 199   BMET  Recent Labs  11/12/13 0425 11/13/13 0555  NA 135* 135*  K 4.6 4.1  CL 96 96  CO2 26 28  GLUCOSE 97 97  BUN 23 17  CREATININE 0.60 0.57  CALCIUM 10.4 9.9   PT/INR  Recent Labs  11/11/13 1450  LABPROT 13.7  INR 1.07   ABG No results found for this basename: PHART, PCO2, PO2, HCO3,  in the last 72 hours  Studies/Results: Dg Chest 2 View  11/11/2013   CLINICAL DATA:  Short of breath, very weak  EXAM: CHEST  2 VIEW  COMPARISON:  Chest radiograph 03/05/2012  FINDINGS: Patient rotated rightward. Normal cardiac silhouette. No effusion, infiltrate, or  pneumothorax. Aorta is ectatic. No acute osseous abnormality.  IMPRESSION: No active cardiopulmonary disease.   Electronically Signed   By: Suzy Bouchard M.D.   On: 11/11/2013 08:29   Ct Abdomen Pelvis W Contrast  11/11/2013   CLINICAL DATA:  Abdominal pain for 2 days. Nausea with recently diagnosed UTI  EXAM: CT ABDOMEN AND PELVIS WITH CONTRAST  TECHNIQUE: Multidetector CT imaging of the abdomen and pelvis was performed using the standard protocol following bolus administration of intravenous contrast.  CONTRAST:  36mL OMNIPAQUE IOHEXOL 300 MG/ML  SOLN  COMPARISON:  10/31/2013 CT abdomen pelvis with contrast and CT abdomen pelvis 03/06/2012, MRCP 02/03/2012.  FINDINGS: Lung bases: Stable areas of linear scarring, no consolidation or pleural effusion. New mild cardiomegaly.  The appearance of the gallbladder has significantly changed since the recent CT of 10/31/2013. The gallbladder is markedly distended, measuring up to 13.9 x 7.0 cm in axial dimension. The gallbladder wall appears markedly thickened, measuring up to approximately 1.9 cm. Portions of the gallbladder wall mucosa markedly enhance, while others do not enhance at all, raising the possibility of a gangrenous mucosa. No calcified gallstones are visualized. There is new right upper quadrant ascites.  The patient has a chronic mild intrahepatic biliary ductal dilatation and chronic extrahepatic biliary ductal dilatation. Common bile duct measures up to 1.3 cm, unchanged in measurement from an MRCP performed February 03, 2012.  No focal hepatic lesion is identified. Pancreas divisum again noted. No pancreatic mass or calcification is identified. No acute findings associated  with the pancreas.  Stable thickening of the right adrenal gland, measured 1.4 cm transverse dimension. Normal left adrenal gland.  There is a moderate amount of stool in the colon and gaseous distention of the colon without evidence of obstruction. Right posterior wall bladder  diverticulum measures approximately 3 cm diameter. Urinary bladder wall thickness is normal.  Stable mild scarring and cortical thinning of the kidneys, left greater than right. Negative for hydronephrosis.  Abdominal aorta demonstrates extensive atherosclerotic changes without aneurysm. Hysterectomy. No adnexal mass. Negative for lymphadenopathy. Negative for free air.  Chronic degenerative disc disease of the lumbar spine. No acute osseous abnormality.  IMPRESSION: 1. Acute cholecystitis. There is marked wall thickening. Some of the gallbladder mucosa enhances markedly while other areas do not enhance at all, raising the possibility of gangrenous cholecystitis. No visible gallstones are seen. There is associated right upper quadrant ascites. Findings discussed by telephone with Dr. Rogene Houston 11/11/2013 at 10:40 am. 2. Chronic stable extrahepatic and mild intrahepatic biliary ductal dilatation. 3. Moderate amount of stool in the colon and gaseous distention of the colon, without evidence of obstruction.   Electronically Signed   By: Curlene Dolphin M.D.   On: 11/11/2013 10:43   Ir Perc Cholecystostomy  11/11/2013   INDICATION: Acute cholecystitis, poor surgical candidate  EXAM: ULTRASOUND AND FLUOROSCOPIC-GUIDED CHOLECYSTOSTOMY TUBE PLACEMENT  COMPARISON:  CT abdomen pelvis -11/11/2013  MEDICATIONS: Fentanyl 75 mcg IV; Versed 1.5 mg IV; The patient is currently admitted to the hospital and on intravenous antibiotics. Antibiotics were administered within an appropriate time frame prior to skin puncture.  ANESTHESIA/SEDATION: Total Moderate Sedation Time  20 minutes  CONTRAST:  81mL OMNIPAQUE IOHEXOL 300 MG/ML  SOLN  FLUOROSCOPY TIME:  48 seconds  COMPLICATIONS: None immediate.  PROCEDURE: Informed written consent was obtained from the patient after a discussion of the risks, benefits and alternatives to treatment. Questions regarding the procedure were encouraged and answered. A timeout was performed prior to the  initiation of the procedure.  The right upper abdominal quadrant was prepped and draped in the usual sterile fashion, and a sterile drape was applied covering the operative field. Maximum barrier sterile technique with sterile gowns and gloves were used for the procedure. A timeout was performed prior to the initiation of the procedure. Local anesthesia was provided with 1% lidocaine with epinephrine.  Ultrasound scanning of the right upper quadrant demonstrates a markedly dilated gallbladder. Utilizing a transhepatic approach, a 22 gauge needle was advanced into the gallbladder under direct ultrasound guidance. An ultrasound image was saved for documentation purposes. Appropriate intraluminal puncture was confirmed with the efflux of bile and advancement of an 0.018 wire into the gallbladder lumen. The needle was exchanged for an [place] set. A small amount of contrast was injected to confirm appropriate intraluminal positioning. Over a Benson wire, a 67.2-French Cook cholecystomy tube was advanced into the gallbladder fossa, coiled and locked. Bile was aspirated and a small amount of contrast was injected as several post procedural spot radiographic images were obtained in various obliquities. The catheter was secured to the skin with suture, connected to a drainage bag and a dressing was placed. The patient tolerated the procedure well without immediate post procedural complication.  IMPRESSION: Successful ultrasound and fluoroscopic guided placement of a 10.2 French cholecystostomy tube.   Electronically Signed   By: Sandi Mariscal M.D.   On: 11/11/2013 17:16    Anti-infectives: Anti-infectives   Start     Dose/Rate Route Frequency Ordered Stop   11/13/13 0000  amoxicillin-clavulanate (AUGMENTIN) 875-125 MG per tablet     1 tablet Oral 2 times daily 11/13/13 0800 11/27/13 2359   11/11/13 1600  piperacillin-tazobactam (ZOSYN) IVPB 3.375 g     3.375 g 12.5 mL/hr over 240 Minutes Intravenous 3 times per  day 11/11/13 1423        Assessment/Plan: Day #2, status post percutaneous cholecystostomy tube placement. She is improving Continue IV Zosyn Mobilize, PT involved Diet as tolerated Possible SNF tomorrow on oral antibiotics.  Appreciate Dr. Sherilyn Cooter involvement for multiple medical problems, including COPD, chronic pain syndrome, diastolic CHF, severe protein calorie malnutrition, deconditioning, tobacco use, depression., etc.  LOS: 2 days    Adin Hector 11/13/2013

## 2013-11-13 NOTE — Clinical Social Work Placement (Signed)
Clinical Social Work Department CLINICAL SOCIAL WORK PLACEMENT NOTE 11/13/2013  Patient:  Theresa Blackwell, Theresa Blackwell  Account Number:  [id number] Admit date:  11/11/2013  Clinical Social Worker:  Raquel Sarna [place], LCSWA  Date/time:  11/13/2013 10:35 AM  Clinical Social Work is seeking post-discharge placement for this patient at the following level of care:   SKILLED NURSING   (*CSW will update this form in Epic as items are completed)   11/13/2013  Patient/family provided with [place] Department of Clinical Social Works list of facilities offering this level of care within the geographic area requested by the patient (or if unable, by the patients family).  11/13/2013  Patient/family informed of their freedom to choose among providers that offer the needed level of care, that participate in Medicare, Medicaid or managed care program needed by the patient, have an available bed and are willing to accept the patient.  11/13/2013  Patient/family informed of [facility] ownership interest in [facility], as well as of the fact that they are under no obligation to receive care at this facility.  PASARR submitted to EDS on  PASARR number received from [place] on   FL2 transmitted to all facilities in geographic area requested by pt/family on  11/13/2013 FL2 transmitted to all facilities within larger geographic area on   Patient informed that his/her managed care company has contracts with or will negotiate with  certain facilities, including the following:     Patient/family informed of bed offers received:   Patient chooses bed at  Physician recommends and patient chooses bed at    Patient to be transferred to  on   Patient to be transferred to facility by   The following physician request were entered in Epic:   Additional Comments: PASARR already existing.  Pati Gallo, [place] Social Worker [phone]

## 2013-11-13 NOTE — Progress Notes (Signed)
Subjective: Pt awake ; intermittent confusion Pain less  Objective: Vital signs in last 24 hours: Temp:  [97.7 F (36.5 C)-98.2 F (36.8 C)] 98.1 F (36.7 C) (05/22 0604) Pulse Rate:  [55-75] 63 (05/22 0604) Resp:  [16-17] 16 (05/22 0604) BP: (108-126)/(56-65) 124/64 mmHg (05/22 0604) SpO2:  [91 %-100 %] 99 % (05/22 0604) Weight:  [59.8 kg (131 lb 13.4 oz)-62.596 kg (138 lb)] 62.596 kg (138 lb) (05/22 0604) Weight change: 0 kg (0 lb) Last BM Date: 11/11/13  Intake/Output from previous day: 05/21 0701 - 05/22 0700 In: 1140 [P.O.:840; IV Piggyback:300] Out: 725 [Urine:300; Drains:425] Intake/Output this shift: Total I/O In: 780 [P.O.:480; IV Piggyback:300] Out: 275 [Drains:275]  General appearance: alert Resp: clear to auscultation bilaterally Cardio: regular rate and rhythm GI: soft, non-tender; bowel sounds normal; no masses,  no organomegaly  Lab Results:  Recent Labs  11/12/13 0425 11/13/13 0555  WBC 11.5* 7.4  HGB 10.9* 9.9*  HCT 33.0* 30.4*  PLT 173 199   BMET  Recent Labs  11/12/13 0425 11/13/13 0555  NA 135* 135*  K 4.6 4.1  CL 96 96  CO2 26 28  GLUCOSE 97 97  BUN 23 17  CREATININE 0.60 0.57  CALCIUM 10.4 9.9    Studies/Results: Dg Chest 2 View  11/11/2013   CLINICAL DATA:  Short of breath, very weak  EXAM: CHEST  2 VIEW  COMPARISON:  Chest radiograph 03/05/2012  FINDINGS: Patient rotated rightward. Normal cardiac silhouette. No effusion, infiltrate, or pneumothorax. Aorta is ectatic. No acute osseous abnormality.  IMPRESSION: No active cardiopulmonary disease.   Electronically Signed   By: Suzy Bouchard M.D.   On: 11/11/2013 08:29   Ct Abdomen Pelvis W Contrast  11/11/2013   CLINICAL DATA:  Abdominal pain for 2 days. Nausea with recently diagnosed UTI  EXAM: CT ABDOMEN AND PELVIS WITH CONTRAST  TECHNIQUE: Multidetector CT imaging of the abdomen and pelvis was performed using the standard protocol following bolus administration of intravenous  contrast.  CONTRAST:  72mL OMNIPAQUE IOHEXOL 300 MG/ML  SOLN  COMPARISON:  10/31/2013 CT abdomen pelvis with contrast and CT abdomen pelvis 03/06/2012, MRCP 02/03/2012.  FINDINGS: Lung bases: Stable areas of linear scarring, no consolidation or pleural effusion. New mild cardiomegaly.  The appearance of the gallbladder has significantly changed since the recent CT of 10/31/2013. The gallbladder is markedly distended, measuring up to 13.9 x 7.0 cm in axial dimension. The gallbladder wall appears markedly thickened, measuring up to approximately 1.9 cm. Portions of the gallbladder wall mucosa markedly enhance, while others do not enhance at all, raising the possibility of a gangrenous mucosa. No calcified gallstones are visualized. There is new right upper quadrant ascites.  The patient has a chronic mild intrahepatic biliary ductal dilatation and chronic extrahepatic biliary ductal dilatation. Common bile duct measures up to 1.3 cm, unchanged in measurement from an MRCP performed February 03, 2012.  No focal hepatic lesion is identified. Pancreas divisum again noted. No pancreatic mass or calcification is identified. No acute findings associated with the pancreas.  Stable thickening of the right adrenal gland, measured 1.4 cm transverse dimension. Normal left adrenal gland.  There is a moderate amount of stool in the colon and gaseous distention of the colon without evidence of obstruction. Right posterior wall bladder diverticulum measures approximately 3 cm diameter. Urinary bladder wall thickness is normal.  Stable mild scarring and cortical thinning of the kidneys, left greater than right. Negative for hydronephrosis.  Abdominal aorta demonstrates extensive atherosclerotic changes without  aneurysm. Hysterectomy. No adnexal mass. Negative for lymphadenopathy. Negative for free air.  Chronic degenerative disc disease of the lumbar spine. No acute osseous abnormality.  IMPRESSION: 1. Acute cholecystitis. There is  marked wall thickening. Some of the gallbladder mucosa enhances markedly while other areas do not enhance at all, raising the possibility of gangrenous cholecystitis. No visible gallstones are seen. There is associated right upper quadrant ascites. Findings discussed by telephone with Dr. Rogene Houston 11/11/2013 at 10:40 am. 2. Chronic stable extrahepatic and mild intrahepatic biliary ductal dilatation. 3. Moderate amount of stool in the colon and gaseous distention of the colon, without evidence of obstruction.   Electronically Signed   By: Curlene Dolphin M.D.   On: 11/11/2013 10:43   Ir Perc Cholecystostomy  11/11/2013   INDICATION: Acute cholecystitis, poor surgical candidate  EXAM: ULTRASOUND AND FLUOROSCOPIC-GUIDED CHOLECYSTOSTOMY TUBE PLACEMENT  COMPARISON:  CT abdomen pelvis -11/11/2013  MEDICATIONS: Fentanyl 75 mcg IV; Versed 1.5 mg IV; The patient is currently admitted to the hospital and on intravenous antibiotics. Antibiotics were administered within an appropriate time frame prior to skin puncture.  ANESTHESIA/SEDATION: Total Moderate Sedation Time  20 minutes  CONTRAST:  53mL OMNIPAQUE IOHEXOL 300 MG/ML  SOLN  FLUOROSCOPY TIME:  48 seconds  COMPLICATIONS: None immediate.  PROCEDURE: Informed written consent was obtained from the patient after a discussion of the risks, benefits and alternatives to treatment. Questions regarding the procedure were encouraged and answered. A timeout was performed prior to the initiation of the procedure.  The right upper abdominal quadrant was prepped and draped in the usual sterile fashion, and a sterile drape was applied covering the operative field. Maximum barrier sterile technique with sterile gowns and gloves were used for the procedure. A timeout was performed prior to the initiation of the procedure. Local anesthesia was provided with 1% lidocaine with epinephrine.  Ultrasound scanning of the right upper quadrant demonstrates a markedly dilated gallbladder.  Utilizing a transhepatic approach, a 22 gauge needle was advanced into the gallbladder under direct ultrasound guidance. An ultrasound image was saved for documentation purposes. Appropriate intraluminal puncture was confirmed with the efflux of bile and advancement of an 0.018 wire into the gallbladder lumen. The needle was exchanged for an [place] set. A small amount of contrast was injected to confirm appropriate intraluminal positioning. Over a Benson wire, a 36.2-French Cook cholecystomy tube was advanced into the gallbladder fossa, coiled and locked. Bile was aspirated and a small amount of contrast was injected as several post procedural spot radiographic images were obtained in various obliquities. The catheter was secured to the skin with suture, connected to a drainage bag and a dressing was placed. The patient tolerated the procedure well without immediate post procedural complication.  IMPRESSION: Successful ultrasound and fluoroscopic guided placement of a 10.2 French cholecystostomy tube.   Electronically Signed   By: Sandi Mariscal M.D.   On: 11/11/2013 17:16    Medications: I have reviewed the patient's current medications.  Assessment/Plan: Acute Cholecystitis/ gangrene gallbladder: s/p Percutaneous drain - with improvement:  Continue IV ABX - today- CHANGE TO PO AUGMENTIN 875 MG BID TOMORROW; pain control. Nausea control  Leukocytosis IMPROVED  COPD continue current meds  Chronic pain syndrome: continue Fentanyl patch ( was on 75 mcg at home) ; tramadol prn  H/o chronic UTI: on ABX  Diastolic CHF- clinically stable- lasix low dose H/o MAT: verapamil daily  Breat cancer with right lymphedema chronic stable  Severe Malnutrion protein: nutrition consult - supplement Weakness / decondtioning: PT  consult- benefit from SNF- short term  Tobacco use- use nicotine patch  Major depression/ anxiety- zoloft. Ok for d/c to SNF tomorrow. With f/u with surgery out pt in 2 weeks  LOS: 2 days    Wenda Low 11/13/2013, 6:55 AM

## 2013-11-13 NOTE — Discharge Summary (Signed)
Physician Discharge Summary  Patient ID: Theresa Blackwell MRN: [id number] DOB/AGE: Nov 11, 1930 80 y.o.  Admit date: 11/11/2013 Discharge date: 11/14/2013  Admission Diagnoses:  Discharge Diagnoses:  Principal Problem:   Gallbladder gangrene-status post percutaneous cholecystectomy tube placed Active Problems:   Abdominal pain   COPD (chronic obstructive pulmonary disease) with acute bronchitis   Chronic respiratory failure   Leukocytosis, unspecified   GERD (gastroesophageal reflux disease)   Depression with anxiety   HLD (hyperlipidemia)   Acute cholecystitis   Severe protein-calorie malnutrition   Chronic diastolic heart failure   Physical deconditioning   Protein-calorie malnutrition, severe   Discharged Condition: good  Hospital Course: 80 year old female admitted with abdominal pain , nausea History  Copd, tobacco use; chronic pain syndrome. Recently started on antibiotics for UTI. underying history of chronic UTI. Problem #1: patient had- CT scan in the emergency - it showed gallbladder wall thickened with possible gangrene; with out any stones;  Peri gallbladder fluid collection. elevated white count; normal LFTs. Surgery consulted- because of he underlying coo morbidities- recommended- percutaneous cholecystectomy tube placed, with good results. started on antibiotics Zosyn. Afebrile; white cell count normalized. Switch to antibiotic antibiotic. Augmentin Follow up with surgery in 2 weeks Possible cholecystectomy in  Weeks. Problem #2:history of chronic pain- continue on  Fentanyl pacht 50 mcg ; tamadol p.r.n. problem #3: history of MAT- continue Verapamil Problem #4: COPD/ Tob use- continue wth  Spiriva; alb neb prn- Chest xray - ok problem #5: severe protein malnutrition nutrition consult  recommended supplements. Problem #6 Maj. depression with chronic anxiety- continue Zoloft. Problem #7: Chronic diastolic hear tfailre- Lasix 20 mg. Problem #8: deconditioning and   Weakness; physical therapy recommended skilled nursing facility  Consults: general surgery  Significant Diagnostic Studies: labs: WBC; [place]- normal; u/a normal, microbiology: blood culture: negative and radiology: CXR: normal and CT scan: Gangrenouss gallblder  Treatments: IV hydration and antibiotics: Zosyn  Discharge Exam: Blood pressure 124/64, pulse 63, temperature 98.1 F (36.7 C), temperature source Oral, resp. rate 16, height 5\' 7"  (1.702 m), weight 62.596 kg (138 lb), SpO2 99.00%. General appearance: alert Resp: clear to auscultation bilaterally Cardio: regular rate and rhythm, S1, S2 normal, no murmur, click, rub or gallop GI: soft, non-tender; bowel sounds normal; no masses,  no organomegaly  Disposition: skilled nursing facility     Medication List    STOP taking these medications       cephALEXin 500 MG capsule  Commonly known as:  KEFLEX     fentaNYL 25 MCG/HR patch  Commonly known as:  DURAGESIC - dosed mcg/hr  Replaced by:  fentaNYL 50 MCG/HR     HYDROcodone-acetaminophen 5-325 MG per tablet  Commonly known as:  NORCO/VICODIN     sulfamethoxazole-trimethoprim 800-160 MG per tablet  Commonly known as:  BACTRIM DS     trimethoprim 100 MG tablet  Commonly known as:  TRIMPEX      TAKE these medications       albuterol (2.5 MG/3ML) 0.083% nebulizer solution  Commonly known as:  PROVENTIL  Take 2.5 mg by nebulization every 6 (six) hours as needed for wheezing or shortness of breath.     amoxicillin-clavulanate 875-125 MG per tablet  Commonly known as:  AUGMENTIN  Take 1 tablet by mouth 2 (two) times daily.     ergocalciferol 50000 UNITS capsule  Commonly known as:  VITAMIN D2  Take 50,000 Units by mouth every 14 (fourteen) days.     fentaNYL 50 MCG/HR  Commonly known as:  DURAGESIC -  dosed mcg/hr  Place 1 patch (50 mcg total) onto the skin every 3 (three) days.     fluticasone 50 MCG/ACT nasal spray  Commonly known as:  FLONASE  Place 2  sprays into the nose daily as needed. For allergies     furosemide 20 MG tablet  Commonly known as:  LASIX  Take 1 tablet (20 mg total) by mouth daily.     multivitamin with minerals Tabs tablet  Take 1 tablet by mouth daily.     omeprazole 20 MG capsule  Commonly known as:  PRILOSEC  Take 20 mg by mouth daily.     polyethylene glycol packet  Commonly known as:  MIRALAX / GLYCOLAX  Take 17 g by mouth daily.     potassium chloride 10 MEQ tablet  Commonly known as:  K-DUR,KLOR-CON  Take 1 tablet (10 mEq total) by mouth daily.     pregabalin 75 MG capsule  Commonly known as:  LYRICA  Take 1 capsule (75 mg total) by mouth 2 (two) times daily.     sertraline 50 MG tablet  Commonly known as:  ZOLOFT  Take 1 tablet (50 mg total) by mouth daily.     tiotropium 18 MCG inhalation capsule  Commonly known as:  SPIRIVA  Place 18 mcg into inhaler and inhale daily.     tiZANidine 4 MG capsule  Commonly known as:  ZANAFLEX  Take 4 mg by mouth at bedtime.     traMADol 50 MG tablet  Commonly known as:  ULTRAM  Take 1 tablet (50 mg total) by mouth every 6 (six) hours as needed (pain).     triamcinolone cream 0.1 %  Commonly known as:  KENALOG  Apply 1 application topically 2 (two) times daily as needed. Swelling in right arm.     verapamil 120 MG CR tablet  Commonly known as:  CALAN-SR  Take 1 tablet (120 mg total) by mouth daily.       discharge planning time- 45 min.  Signed: Wenda Low 11/13/2013, 8:04 AM

## 2013-11-14 NOTE — Progress Notes (Signed)
Assessment/Plan: Principal Problem:   Gallbladder gangrene - post cholecystostomy. Clinically stable but still with too much pain to try to get out today. Facility plan is not known to family and there is nothing in chart about it. May be stable for D/C in AM. FL2 and prescriptions signed.  Active Problems:   Abdominal pain   COPD (chronic obstructive pulmonary disease) with acute bronchitis   Chronic respiratory failure   Leukocytosis, unspecified   GERD (gastroesophageal reflux disease)   Depression with anxiety   HLD (hyperlipidemia)   Acute cholecystitis   Severe protein-calorie malnutrition   Chronic diastolic heart failure   Physical deconditioning    Subjective: Patient had bad night with lots of pain and had to finally take morphine about 0600. She feels a bit better now.   Objective:  Vital Signs: Filed Vitals:   11/13/13 1322 11/13/13 2057 11/13/13 2151 11/14/13 0656  BP: 120/62  111/43 124/52  Pulse: 66  62 53  Temp: 98.4 F (36.9 C)  97.8 F (36.6 C) 97.6 F (36.4 C)  TempSrc: Oral  Axillary Oral  Resp: 16  18 14   Height:      Weight:    62.596 kg (138 lb)  SpO2: 98% 92% 98% 97%     EXAM: Soft, mild RUQ tenderness.    Intake/Output Summary (Last 24 hours) at 11/14/13 0823 Last data filed at 11/14/13 7564  Gross per 24 hour  Intake      0 ml  Output    425 ml  Net   -425 ml    Lab Results:  Recent Labs  11/12/13 0425 11/13/13 0555  NA 135* 135*  K 4.6 4.1  CL 96 96  CO2 26 28  GLUCOSE 97 97  BUN 23 17  CREATININE 0.60 0.57  CALCIUM 10.4 9.9    Recent Labs  11/13/13 0555  AST 30  ALT 64*  ALKPHOS 102  BILITOT 0.6  PROT 6.0  ALBUMIN 2.7*   No results found for this basename: LIPASE, AMYLASE,  in the last 72 hours  Recent Labs  11/12/13 0425 11/13/13 0555  WBC 11.5* 7.4  HGB 10.9* 9.9*  HCT 33.0* 30.4*  MCV 100.3* 100.7*  PLT 173 199   No results found for this basename: CKTOTAL, CKMB, CKMBINDEX, TROPONINI,  in the last  72 hours BNP    Component Value Date/Time   PROBNP 5213.0* 06/07/2011 0903   No results found for this basename: DDIMER,  in the last 72 hours No results found for this basename: HGBA1C,  in the last 72 hours No results found for this basename: CHOL, HDL, LDLCALC, TRIG, CHOLHDL, LDLDIRECT,  in the last 72 hours No results found for this basename: TSH, T4TOTAL, FREET3, T3FREE, THYROIDAB,  in the last 72 hours No results found for this basename: VITAMINB12, FOLATE, FERRITIN, TIBC, IRON, RETICCTPCT,  in the last 72 hours  Studies/Results: No results found. Medications: Medications administered in the last 24 hours reviewed.  Current Medication List reviewed.    LOS: 3 days   Tyrone Schimke Internal Medicine @ [facility] [phone]) 11/14/2013, 8:23 AM

## 2013-11-14 NOTE — Progress Notes (Signed)
  Subjective: Looks like she feels a little better. No distress. Almost no pain. eating some but not a lot. Getting out of bed but still quite feeble.  Objective: Vital signs in last 24 hours: Temp:  [97.6 F (36.4 C)-98.4 F (36.9 C)] 97.6 F (36.4 C) (05/23 0656) Pulse Rate:  [53-66] 53 (05/23 0656) Resp:  [14-18] 14 (05/23 0656) BP: (111-124)/(43-62) 124/52 mmHg (05/23 0656) SpO2:  [92 %-98 %] 97 % (05/23 0656) Weight:  [138 lb (62.596 kg)] 138 lb (62.596 kg) (05/23 0656) Last BM Date: 11/12/13  Intake/Output from previous day: 05/22 0701 - 05/23 0700 In: -  Out: 425 [Drains:425] Intake/Output this shift: Total I/O In: -  Out: 250 [Urine:250]  EXAM: General appearance:  alert. Able to have a good conversation. Poor memory for events. Febrile and deconditioned. No distress. GI: abdomen is soft. Essentially nontender except for slight subjective tenderness to deep palpation right upper quadrant. No mass. Drainage shows clear, green, apparently noninfected bile.  Lab Results:   Recent Labs  11/12/13 0425 11/13/13 0555  WBC 11.5* 7.4  HGB 10.9* 9.9*  HCT 33.0* 30.4*  PLT 173 199   BMET  Recent Labs  11/12/13 0425 11/13/13 0555  NA 135* 135*  K 4.6 4.1  CL 96 96  CO2 26 28  GLUCOSE 97 97  BUN 23 17  CREATININE 0.60 0.57  CALCIUM 10.4 9.9   PT/INR  Recent Labs  11/11/13 1450  LABPROT 13.7  INR 1.07   ABG No results found for this basename: PHART, PCO2, PO2, HCO3,  in the last 72 hours  Studies/Results: No results found.  Anti-infectives: Anti-infectives   Start     Dose/Rate Route Frequency Ordered Stop   11/13/13 0000  amoxicillin-clavulanate (AUGMENTIN) 875-125 MG per tablet     1 tablet Oral 2 times daily 11/13/13 0800 11/27/13 2359   11/11/13 1600  piperacillin-tazobactam (ZOSYN) IVPB 3.375 g     3.375 g 12.5 mL/hr over 240 Minutes Intravenous 3 times per day 11/11/13 1423        Assessment/Plan:   Day #3, status post percutaneous  cholecystostomy tube placement.  She is improving  Continue IV Zosyn  Mobilize, PT involved  Diet as tolerated  SNF soon. Defer timing to Dr. Sherilyn Cooter group  When discharged, followup with Dr. Donnie Mesa  in about 6 weeks.   Appreciate Dr. Sherilyn Cooter involvement for multiple medical problems, including COPD, chronic pain syndrome, diastolic CHF, severe protein calorie malnutrition, deconditioning, tobacco use, depression., etc.     LOS: 3 days    Adin Hector 11/14/2013

## 2013-11-15 LAB — CBC
HCT: 32.4 % — ABNORMAL LOW (ref 36.0–46.0)
Hemoglobin: 10.5 g/dL — ABNORMAL LOW (ref 12.0–15.0)
MCH: 32.6 pg (ref 26.0–34.0)
MCHC: 32.4 g/dL (ref 30.0–36.0)
MCV: 100.6 fL — ABNORMAL HIGH (ref 78.0–100.0)
Platelets: 215 10*3/uL (ref 150–400)
RBC: 3.22 MIL/uL — ABNORMAL LOW (ref 3.87–5.11)
RDW: 12.2 % (ref 11.5–15.5)
WBC: 5.1 10*3/uL (ref 4.0–10.5)

## 2013-11-15 MED ORDER — MAGNESIUM HYDROXIDE 400 MG/5ML PO SUSP: 30.0000 mL | Freq: Every day | ORAL | Status: DC | PRN

## 2013-11-15 MED ORDER — AMOXICILLIN-POT CLAVULANATE 875-125 MG PO TABS
1.0000 | ORAL_TABLET | Freq: Two times a day (BID) | ORAL | Status: DC
  Administered 2013-11-15 – 2013-11-16 (×3): 1 via ORAL
  Filled 2013-11-15 (×4): qty 1

## 2013-11-15 MED ORDER — OXYCODONE-ACETAMINOPHEN 5-325 MG PO TABS
1.0000 | ORAL_TABLET | Freq: Four times a day (QID) | ORAL | Status: DC | PRN
  Administered 2013-11-15 – 2013-11-16 (×3): 1 via ORAL
  Filled 2013-11-15 (×3): qty 1

## 2013-11-15 NOTE — Progress Notes (Signed)
Assessment/Plan: Principal Problem:   Gallbladder gangrene - slow improvement. Change to po pain meds along with po abx. Would be ready for d/c to SNF in AM if bed available. Will give MOM prn.  Active Problems:   Abdominal pain   COPD (chronic obstructive pulmonary disease) with acute bronchitis   Chronic respiratory failure   Leukocytosis, unspecified   GERD (gastroesophageal reflux disease)   Depression with anxiety   HLD (hyperlipidemia)   Acute cholecystitis   Severe protein-calorie malnutrition   Chronic diastolic heart failure   Physical deconditioning   Subjective: Feels about the same. No new issues. Still requiring IV pain meds.   Objective:  Vital Signs: Filed Vitals:   11/14/13 1421 11/14/13 2033 11/14/13 2131 11/15/13 0644  BP: 118/58  127/55 146/65  Pulse: 60  59 68  Temp: 97.8 F (36.6 C)  98.1 F (36.7 C) 98 F (36.7 C)  TempSrc: Oral  Oral   Resp: 16  15 15   Height:      Weight:    62.096 kg (136 lb 14.4 oz)  SpO2: 95% 96% 98% 96%     EXAM: soft, mild diffuse tenderness   Intake/Output Summary (Last 24 hours) at 11/15/13 0847 Last data filed at 11/15/13 0700  Gross per 24 hour  Intake    440 ml  Output    655 ml  Net   -215 ml    Lab Results:  Recent Labs  11/13/13 0555  NA 135*  K 4.1  CL 96  CO2 28  GLUCOSE 97  BUN 17  CREATININE 0.57  CALCIUM 9.9    Recent Labs  11/13/13 0555  AST 30  ALT 64*  ALKPHOS 102  BILITOT 0.6  PROT 6.0  ALBUMIN 2.7*   No results found for this basename: LIPASE, AMYLASE,  in the last 72 hours  Recent Labs  11/13/13 0555  WBC 7.4  HGB 9.9*  HCT 30.4*  MCV 100.7*  PLT 199   No results found for this basename: CKTOTAL, CKMB, CKMBINDEX, TROPONINI,  in the last 72 hours BNP    Component Value Date/Time   PROBNP 5213.0* 06/07/2011 0903   No results found for this basename: DDIMER,  in the last 72 hours No results found for this basename: HGBA1C,  in the last 72 hours No results found  for this basename: CHOL, HDL, LDLCALC, TRIG, CHOLHDL, LDLDIRECT,  in the last 72 hours No results found for this basename: TSH, T4TOTAL, FREET3, T3FREE, THYROIDAB,  in the last 72 hours No results found for this basename: VITAMINB12, FOLATE, FERRITIN, TIBC, IRON, RETICCTPCT,  in the last 72 hours  Studies/Results: No results found. Medications: Medications administered in the last 24 hours reviewed.  Current Medication List reviewed.    LOS: 4 days   Tyrone Schimke Internal Medicine @ [facility] [phone]) 11/15/2013, 8:47 AM

## 2013-11-15 NOTE — Plan of Care (Signed)
Problem: Phase I Progression Outcomes Goal: OOB as tolerated unless otherwise ordered Outcome: Progressing Up to [facility]

## 2013-11-15 NOTE — Progress Notes (Signed)
  Subjective: Not much change. She has periods where she feels bad and nauseated it appears where she feels pretty good. Still quite feeble and deconditioned.  Objective: Vital signs in last 24 hours: Temp:  [97.6 F (36.4 C)-98.1 F (36.7 C)] 98 F (36.7 C) (05/24 0644) Pulse Rate:  [53-68] 68 (05/24 0644) Resp:  [14-16] 15 (05/24 0644) BP: (118-146)/(52-65) 146/65 mmHg (05/24 0644) SpO2:  [94 %-98 %] 96 % (05/24 0644) Weight:  [138 lb (62.596 kg)] 138 lb (62.596 kg) (05/23 0656) Last BM Date: 11/12/13  Intake/Output from previous day: 05/23 0701 - 05/24 0700 In: 440 [P.O.:240; IV Piggyback:200] Out: 755 [Urine:675; Drains:80] Intake/Output this shift: Total I/O In: -  Out: 80 [Drains:80]  General appearance: slowed mentation and awake and alert. Seems depressed and anxious. Then, febrile, deconditioned. Does not appear toxic or acutely ill. Resp: clear to auscultation bilaterally GI: abdomen is essentially soft and nontender. No mass. Drainage is clear  green bile, does not look infected.  Lab Results:   Recent Labs  11/13/13 0555  WBC 7.4  HGB 9.9*  HCT 30.4*  PLT 199   BMET  Recent Labs  11/13/13 0555  NA 135*  K 4.1  CL 96  CO2 28  GLUCOSE 97  BUN 17  CREATININE 0.57  CALCIUM 9.9   PT/INR No results found for this basename: LABPROT, INR,  in the last 72 hours ABG No results found for this basename: PHART, PCO2, PO2, HCO3,  in the last 72 hours  Studies/Results: No results found.  Anti-infectives: Anti-infectives   Start     Dose/Rate Route Frequency Ordered Stop   11/13/13 0000  amoxicillin-clavulanate (AUGMENTIN) 875-125 MG per tablet     1 tablet Oral 2 times daily 11/13/13 0800 11/27/13 2359   11/11/13 1600  piperacillin-tazobactam (ZOSYN) IVPB 3.375 g     3.375 g 12.5 mL/hr over 240 Minutes Intravenous 3 times per day 11/11/13 1423        Assessment/Plan:  Day #4, status post percutaneous cholecystostomy tube placement.  She is  improving, albeit very slowly Continue IV Zosyn - Since afebrile with normal WBC, could consider switching to oral Augmentin, or oral Cipro and Flagyl. Mobilize, PT involved  Diet as tolerated  SNF soon. Defer timing to Dr. Sherilyn Cooter group   When discharged, followup with Dr. Donnie Mesa in about 6 weeks.   Appreciate Dr. Sherilyn Cooter involvement for multiple medical problems, including COPD, chronic pain syndrome, diastolic CHF, severe protein calorie malnutrition, deconditioning, tobacco use, depression., etc.    LOS: 4 days    Theresa Blackwell 11/15/2013

## 2013-11-16 MED ORDER — MAGNESIUM HYDROXIDE 400 MG/5ML PO SUSP: 30.0000 mL | Freq: Every day | ORAL | Status: DC | PRN

## 2013-11-16 MED ORDER — OXYCODONE-ACETAMINOPHEN 5-325 MG PO TABS: 1.0000 | ORAL_TABLET | Freq: Four times a day (QID) | ORAL | Status: DC | PRN

## 2013-11-16 NOTE — Progress Notes (Signed)
  Subjective: Condition stable. Tolerating diet somewhat without vomiting. Continues to complain of nausea. Continues to complain of abdominal pain.  Drains functioning well. Clear bile coming out.  Objective: Vital signs in last 24 hours: Temp:  [97.6 F (36.4 C)-98.4 F (36.9 C)] 97.6 F (36.4 C) (05/25 0538) Pulse Rate:  [58-66] 58 (05/25 0538) Resp:  [16] 16 (05/25 0538) BP: (125-150)/(57-66) 150/63 mmHg (05/25 0538) SpO2:  [96 %-100 %] 98 % (05/25 0538) Last BM Date: 11/12/13  Intake/Output from previous day: 05/24 0701 - 05/25 0700 In: 480 [P.O.:480] Out: 0  Intake/Output this shift:     General appearance: slowed mentation and awake and alert. Seems depressed and anxious. afebrile, deconditioned. Does not appear toxic or acutely ill.  Resp: clear to auscultation bilaterally  GI: abdomen is essentially soft and nontender. No mass. Drainage is clear green bile, does not look infected.  Lab Results:   Recent Labs  11/15/13 0946  WBC 5.1  HGB 10.5*  HCT 32.4*  PLT 215   BMET No results found for this basename: NA, K, CL, CO2, GLUCOSE, BUN, CREATININE, CALCIUM,  in the last 72 hours PT/INR No results found for this basename: LABPROT, INR,  in the last 72 hours ABG No results found for this basename: PHART, PCO2, PO2, HCO3,  in the last 72 hours  Studies/Results: No results found.  Anti-infectives: Anti-infectives   Start     Dose/Rate Route Frequency Ordered Stop   11/15/13 1000  amoxicillin-clavulanate (AUGMENTIN) 875-125 MG per tablet 1 tablet     1 tablet Oral Every 12 hours 11/15/13 0849     11/13/13 0000  amoxicillin-clavulanate (AUGMENTIN) 875-125 MG per tablet     1 tablet Oral 2 times daily 11/13/13 0800 11/27/13 2359   11/11/13 1600  piperacillin-tazobactam (ZOSYN) IVPB 3.375 g  Status:  Discontinued     3.375 g 12.5 mL/hr over 240 Minutes Intravenous 3 times per day 11/11/13 1423 11/15/13 0849      Assessment/Plan:  Day #5, status post  percutaneous cholecystostomy tube placement.  She is improving, albeit very slowly  Acute infectious or inflammatory cholecystitis is resolving clinically. Continue IV Zosyn - Since afebrile with normal WBC, could consider switching to oral Augmentin, or oral Cipro and Flagyl.  Drain will be left in place for 6-8 weeks. Mobilize, PT involved  Diet as tolerated  SNF soon. Defer timing to Dr. Sherilyn Cooter group   When discharged, followup with Dr. Donnie Mesa in about 6 weeks.   Appreciate Dr. Sherilyn Cooter involvement for multiple medical problems, including COPD, chronic pain syndrome, diastolic CHF, severe protein calorie malnutrition, deconditioning, tobacco use, depression., etc.    LOS: 5 days    Theresa Blackwell 11/16/2013

## 2013-11-16 NOTE — Progress Notes (Signed)
Interval note for discharge:  Since Dr. Glenna Durand d/c note of 11/13/13, the patient has been changed to oral pain meds and oral antibiotics. She continues to be debilitated and deconditioned. She has been eating some but not much.   She is stable and ready [facility] to [facility]. She has the cholestostomy drain in place.

## 2013-11-16 NOTE — Progress Notes (Signed)
Discharged. Discharged to skilled nursing facility. [facility].  Transported by ambulance.Patient was alert and orientated prior to transfer. No distress.

## 2013-11-16 NOTE — Progress Notes (Addendum)
CSW received a call from pt care giver Ms. Tamala Julian who informed CSW that pt family informed her of bed offers and family will accept bed offer (only bed offer in [place]) at [facility]. CSW has placed dc packet in shadow chart and contacted [facility] for transport. No additional needs, CSW signing off. *CSW left a message for pt grandson Rachel Bo regarding transport being contacted. CSW also attempted to reach pt son but unsuccessful.  Hunt Oris, MSW, [place]

## 2013-11-16 NOTE — Progress Notes (Signed)
CSW informed pt grandson of bed offer from [facility] - grandson states that's the only facility pt and family would not be agreeable to. CSW informed son of pt being ready for dc today. Grandson to speak with family regarding bed offer and possibly considering placement in [facility].  Hunt Oris, MSW, [place]

## 2013-11-16 NOTE — Progress Notes (Signed)
Physical Therapy Treatment Patient Details Name: Theresa Blackwell MRN: [id number] DOB: [birth year] Today's Date: 11/16/2013    History of Present Illness 80 yo female admitted 11/11/13 with abdominal pain, found to have gallbladder gangrene. S/P percutaneous drain.    PT Comments    Pt demo's significant functional and cognitive improvements. Pt able to tolerate ambulation this date however was limited by onset of nausea and fatigue. RN notified. Cont' to recommend ST-SNF upon d/c.   Follow Up Recommendations  SNF;Supervision/Assistance - 24 hour     Equipment Recommendations  None recommended by PT    Recommendations for Other Services       Precautions / Restrictions Precautions Precautions: Fall Precaution Comments: R drain, low bed with pads Restrictions Weight Bearing Restrictions: No    Mobility  Bed Mobility Overal bed mobility: Needs Assistance Bed Mobility: Supine to Sit;Sit to Supine     Supine to sit: Supervision Sit to supine: Supervision   General bed mobility comments: increased time  Transfers Overall transfer level: Needs assistance Equipment used: Rolling walker (2 wheeled) Transfers: Sit to/from Stand Sit to Stand: Min guard         General transfer comment: v/c's for safe hand placement on the walker, increased time  Ambulation/Gait Ambulation/Gait assistance: Min guard Ambulation Distance (Feet): 100 Feet Assistive device: Rolling walker (2 wheeled) Gait Pattern/deviations: Step-through pattern;Decreased stride length;Trunk flexed Gait velocity: decresaed   General Gait Details: pt with good walker management, v/c's to hold head up and achieve full back extension. pt with onset of fatigue with increased trunk flexion   Stairs            Wheelchair Mobility    Modified Rankin (Stroke Patients Only)       Balance           Standing balance support: Bilateral upper extremity supported Standing balance-Leahy Scale:  Poor Standing balance comment: requires UE support for safe standing                    Cognition Arousal/Alertness: Awake/alert Behavior During Therapy: WFL for tasks assessed/performed Overall Cognitive Status: Within Functional Limits for tasks assessed                 General Comments: pt became slightly impulsive/anxious with the onset of nausea    Exercises      General Comments General comments (skin integrity, edema, etc.): pt became nauseated on the way back and strongly desired to return to bed instead of sitting up in chair and eating. RN notified      Pertinent Vitals/Pain C/o headache posteriorly, RN reports she just provided tylenol    Home Living                      Prior Function            PT Goals (current goals can now be found in the care plan section) Progress towards PT goals: Progressing toward goals    Frequency  Min 3X/week    PT Plan Current plan remains appropriate    Co-evaluation             End of Session Equipment Utilized During Treatment: Gait belt Activity Tolerance:  (limited by nausea) Patient left: in bed;with call bell/phone within reach;with bed alarm set;with family/visitor present     Time: 5885-0277 PT Time Calculation (min): 14 min  Charges:  $Gait Training: 8-22 mins  G Codes:      [place] November 30, 2013, 1:43 PM  Kittie Plater, PT, DPT Pager #: [phone] Office #: [phone]

## 2013-12-14 ENCOUNTER — Ambulatory Visit (INDEPENDENT_AMBULATORY_CARE_PROVIDER_SITE_OTHER): Payer: Medicare Other | Admitting: Surgery

## 2013-12-14 ENCOUNTER — Encounter (INDEPENDENT_AMBULATORY_CARE_PROVIDER_SITE_OTHER): Payer: Self-pay | Admitting: Surgery

## 2013-12-14 ENCOUNTER — Encounter (INDEPENDENT_AMBULATORY_CARE_PROVIDER_SITE_OTHER): Payer: Medicare Other | Admitting: Surgery

## 2013-12-14 VITALS — BP 126/80 | HR 72 | Temp 98.1°F | Ht 66.0 in | Wt 133.0 lb

## 2013-12-14 DIAGNOSIS — J440 Chronic obstructive pulmonary disease with acute lower respiratory infection: Secondary | ICD-10-CM

## 2013-12-14 DIAGNOSIS — K810 Acute cholecystitis: Secondary | ICD-10-CM

## 2013-12-14 DIAGNOSIS — J209 Acute bronchitis, unspecified: Secondary | ICD-10-CM

## 2013-12-14 NOTE — Progress Notes (Signed)
Patient ID: Theresa Blackwell, female   DOB: [birth year], 80 y.o.   MRN: [id number]  Chief Complaint  Patient presents with  . Routine Post Op    HPI Theresa Blackwell is a 80 y.o. female.  Follow-up from hospital PCP  - Dr. Benita Stabile HPI This is a frail 80 yo female with chronic respiratory failure/COPD who requires usually around 3L [place] at home. She continues to smoke about 2 packs of cigarettes a day. She has multiple other medical problems as well. Several months ago, she began complaining of nausea particularly postprandial. No associated pain at that time. However, a couple of weeks ago, she began having pain. She went to [facility] where she had a workup that was negative, except for a UTI which she chronically has and is managed by Dr. Janice Norrie. She was put on abx therapy for this. Her pain continued to persist. She wanted to come back to the ED, but chose not to in order to give her abx a chance to work. On 11/11/13 11 days from her initial visit, her pain got so bad that they called EMS to bring her back. She had a WBC of 14K with normal LFTS. She did, however, have a CT scan that shows significant inflammation of her gallbladder with wall thickness up to 2cm. There is surrounding ascites and likely some gangrenous areas of her gallbladder as well. Her colon is also markedly dilated, like reactive to this process in the RUQ.  We saw her in consultation and recommended a percutaneous cholecystostomy tube because of her significant comorbidities.    She was discharge about a month ago to SNF and was discharged home last week.  She continues to smoke at least a pack a day.  There has been minimal clear bilious drainage from her cholecystostomy tube.  Her main complaint is of LUQ abdominal pain.   Past Medical History  Diagnosis Date  . Anxiety   . Smoking   . Fibromyalgia   . Depression   . Hyperlipidemia   . Hypercalcemia   . Adrenal adenoma     Stable as of 05/2012 CT scan  . Chronic respiratory  failure   . Coronary artery disease   . GERD (gastroesophageal reflux disease)   . Lymphedema syndrome, postmastectomy     RUE  . Breast CA 1970    rt breast  . CHF (congestive heart failure)   . COPD (chronic obstructive pulmonary disease)   . On home oxygen therapy     "3L; 24/7" (11/11/2013)  . DJD (degenerative joint disease)   . Arthritis     "joints" (11/11/2013)  . Chronic back pain     "shoulders to lower back" (11/11/2013)  . Frequent UTI     Past Surgical History  Procedure Laterality Date  . Abdominal hysterectomy    . Appendectomy    . Squamous cell carcinoma excision      Floor of mouth  . Colonoscopy w/ biopsies and polypectomy    . Nasal sinus surgery  1994  . Eus  04/03/2012    Procedure: UPPER ENDOSCOPIC ULTRASOUND (EUS) LINEAR;  Surgeon: Milus Banister, MD;  Location: [facility];  Service: Endoscopy;  Laterality: N/A;  radial linear  . Mastectomy, radical Right 1970  . Breast biopsy Right ~ 1970  . Refractive surgery Bilateral ~ 2012  . Back surgery    . Posterior lumbar fusion  ?1970's    Family History  Problem Relation Age of Onset  .  Colon cancer Brother   . Heart disease Father     Social History History  Substance Use Topics  . Smoking status: Current Every Day Smoker -- 3.00 packs/day for 64 years    Types: Cigarettes  . Smokeless tobacco: Never Used  . Alcohol Use: No     Comment: stopped in 2013 but drank 1/5 every couple of days    No Known Allergies  Current Outpatient Prescriptions  Medication Sig Dispense Refill  . albuterol (PROVENTIL) (2.5 MG/3ML) 0.083% nebulizer solution Take 2.5 mg by nebulization every 6 (six) hours as needed for wheezing or shortness of breath.      . ergocalciferol (VITAMIN D2) 50000 UNITS capsule Take 50,000 Units by mouth every 14 (fourteen) days.       . fentaNYL (DURAGESIC - DOSED MCG/HR) 50 MCG/HR Place 1 patch (50 mcg total) onto the skin every 3 (three) days.  5 patch  0  . fluticasone (FLONASE)  50 MCG/ACT nasal spray Place 2 sprays into the nose daily as needed. For allergies       . furosemide (LASIX) 20 MG tablet Take 1 tablet (20 mg total) by mouth daily.  30 tablet  0  . magnesium hydroxide (MILK OF MAGNESIA) 400 MG/5ML suspension Take 30 mLs by mouth daily as needed for mild constipation.  360 mL  0  . Multiple Vitamin (MULTIVITAMIN WITH MINERALS) TABS Take 1 tablet by mouth daily.  30 tablet  0  . omeprazole (PRILOSEC) 20 MG capsule Take 20 mg by mouth daily.        . polyethylene glycol (MIRALAX / GLYCOLAX) packet Take 17 g by mouth daily.      . potassium chloride (K-DUR,KLOR-CON) 10 MEQ tablet Take 1 tablet (10 mEq total) by mouth daily.  30 tablet  0  . pregabalin (LYRICA) 75 MG capsule Take 1 capsule (75 mg total) by mouth 2 (two) times daily.  60 capsule  3  . sertraline (ZOLOFT) 50 MG tablet Take 1 tablet (50 mg total) by mouth daily.  30 tablet  3  . tiotropium (SPIRIVA) 18 MCG inhalation capsule Place 18 mcg into inhaler and inhale daily.        Marland Kitchen tiZANidine (ZANAFLEX) 4 MG capsule Take 4 mg by mouth at bedtime.        . triamcinolone cream (KENALOG) 0.1 % Apply 1 application topically 2 (two) times daily as needed. Swelling in right arm.      . verapamil (CALAN-SR) 120 MG CR tablet Take 1 tablet (120 mg total) by mouth daily.  30 tablet  0   No current facility-administered medications for this visit.    Review of Systems Review of Systems  Constitutional: Positive for appetite change and fatigue. Negative for fever, chills and unexpected weight change.  HENT: Negative for congestion, hearing loss, sore throat, trouble swallowing and voice change.   Eyes: Negative for visual disturbance.  Respiratory: Positive for cough. Negative for wheezing.   Cardiovascular: Negative for chest pain, palpitations and leg swelling.  Gastrointestinal: Positive for abdominal pain. Negative for nausea, vomiting, diarrhea, constipation, blood in stool, abdominal distention and anal  bleeding.  Genitourinary: Negative for hematuria, vaginal bleeding and difficulty urinating.  Musculoskeletal: Negative for arthralgias.  Skin: Negative for rash and wound.  Neurological: Negative for seizures, syncope and headaches.  Hematological: Negative for adenopathy. Does not bruise/bleed easily.  Psychiatric/Behavioral: Negative for confusion.    Blood pressure 126/80, pulse 72, temperature 98.1 F (36.7 C), height 5\' 6"  (1.676 m),  weight 133 lb (60.328 kg).  Physical Exam Physical Exam Thin female - smells heavily of tobacco smoke HEENT:  EOMI, sclera anicteric Neck:  No masses, no thyromegaly Lungs:  CTA bilaterally; normal respiratory effort CV:  Regular rate and rhythm; no murmurs Abd:  +bowel sounds, soft, mild LUQ tenderness; no RUQ tenderness Cholecystostomy with clear yellow bile Ext:  Well-perfused; no edema Skin:  Warm, dry; no sign of jaundice  Data Reviewed none  Assessment    Severe acute cholecystitis s/p percutaneous cholecystostomy - infectious status improved Patient continues to abuse tobacco despite oxygen-dependent COPD.       Plan    The patient needs to have an elective cholecystectomy to prevent this from occurring again, but she has significant comorbidities and continues to abuse tobacco.  She would be at elevated risk for perioperative complications because of her medical issues.  She has an appointment with Dr. Lysle Rubens tomorrow for evaluation.  We will obtain a contrast study through her percutaneous cholecystostomy to see if the cystic duct is patent.        TSUEI,MATTHEW K. 12/14/2013, 12:21 PM

## 2013-12-18 ENCOUNTER — Ambulatory Visit (HOSPITAL_COMMUNITY)
Admission: RE | Admit: 2013-12-18 | Discharge: 2013-12-18 | Disposition: A | Discharge status: Home / Self Care | Payer: Medicare Other | Source: Ambulatory Visit | Attending: Surgery | Admitting: Surgery

## 2013-12-18 DIAGNOSIS — Z438 Encounter for attention to other artificial openings: Secondary | ICD-10-CM | POA: Diagnosis not present

## 2013-12-18 DIAGNOSIS — R110 Nausea: Secondary | ICD-10-CM | POA: Diagnosis present

## 2013-12-18 DIAGNOSIS — K810 Acute cholecystitis: Secondary | ICD-10-CM

## 2013-12-18 MED ORDER — IOHEXOL 300 MG/ML  SOLN
10.0000 mL | Freq: Once | INTRAMUSCULAR | Status: AC | PRN
  Administered 2013-12-18: 15 mL via INTRAVENOUS

## 2014-01-04 ENCOUNTER — Ambulatory Visit (INDEPENDENT_AMBULATORY_CARE_PROVIDER_SITE_OTHER): Payer: Medicare Other | Admitting: Surgery

## 2014-01-04 ENCOUNTER — Encounter (INDEPENDENT_AMBULATORY_CARE_PROVIDER_SITE_OTHER): Payer: Self-pay | Admitting: Surgery

## 2014-01-04 VITALS — BP 127/84 | HR 72 | Temp 98.1°F | Resp 18 | Ht 66.0 in | Wt 127.8 lb

## 2014-01-04 DIAGNOSIS — K810 Acute cholecystitis: Secondary | ICD-10-CM

## 2014-01-04 NOTE — Progress Notes (Signed)
HPI  This is a frail 80 yo female with chronic respiratory failure/COPD who requires usually around 3L [place] at home. She continues to smoke about 1-2 packs of cigarettes a day. She has multiple other medical problems as well. Several months ago, she began complaining of nausea particularly postprandial. No associated pain at that time. However, a couple of weeks ago, she began having pain. She went to [facility] where she had a workup that was negative, except for a UTI which she chronically has and is managed by Dr. Janice Norrie. She was put on abx therapy for this. Her pain continued to persist. She wanted to come back to the ED, but chose not to in order to give her abx a chance to work. On 11/11/13 11 days from her initial visit, her pain got so bad that they called EMS to bring her back. She had a WBC of 14K with normal LFTS. She did, however, have a CT scan that shows significant inflammation of her gallbladder with wall thickness up to 2cm. There is surrounding ascites and likely some gangrenous areas of her gallbladder as well. Her colon is also markedly dilated, like reactive to this process in the RUQ. We saw her in consultation and recommended a percutaneous cholecystostomy tube because of her significant comorbidities.   She was discharge about a month ago to SNF and was discharged home last week. She continues to smoke at least a pack a day. There has been minimal clear bilious drainage from her cholecystostomy tube. Her appetite is better.    Ir Cholan Exist Tube  12/18/2013   INDICATION: History of acute cholecystitis, poor surgical candidate, post ultrasound fluoroscopic guided percutaneous cholecystostomy tube placement - 11/11/2013. The patient reports subtly decreased output from the cholecystostomy tube for the past week. The patient admits to persistent nausea though denies recurrence of her right upper abdominal pain.  EXAM: CHOLANGIOGRAM VIA EXISTING CATHETER  MEDICATIONS: None  ANESTHESIA/SEDATION:  None  CONTRAST:  24mL OMNIPAQUE IOHEXOL 300 MG/ML SOLN - administered via the percutaneous cholecystostomy tube.  COMPLICATIONS: None immediate.  PROCEDURE: A preprocedural spot radiographic image was obtained of the right upper abdominal quadrant in the existing percutaneous cholecystostomy tube.  The existing cholecystostomy tube was injected with a small amount of contrast as multiple spot fluoroscopic and radiographic images were obtained in various obliquities. Images were reviewed and the procedure was terminated. The catheter was flushed with a small amount of saline and reconnected to a gravity bag. The patient tolerated the procedure well without immediate postprocedural complication.  FINDINGS: Preprocedural spot fluoroscopic radiographic image demonstrates unchanged positioning of the cholecystostomy tube overlying the right upper abdominal quadrant.  Contrast injection demonstrates opacification of the neck of a markedly shrunken gallbladder with reflux of contrast along the percutaneous cholecystostomy tube track and about the caudal and lateral aspects of the right lobe of the liver. There is minimal opacification of the peripheral aspect of the cystic duct without opacification of the common bile duct.  Real-time ultrasound scanning performed by the dictating radiologist failed to identify the gallbladder which thus presumably completely decompressed secondary to the cholecystostomy tube.  IMPRESSION: Percutaneous cholangiogram performed via the existing cholecystostomy demonstrates a markedly shrunken and decompressed gallbladder with very minimal opacification of the peripheral aspect of the cystic duct. There is no opacification of the common bile duct.   Electronically Signed   By: Sandi Mariscal M.D.   On: 12/18/2013 16:36    The patient continues to have an occluded cystic duct. If  we removed her drain she would likely have a recurrence of acute cholecystitis. At this point, my recommendation  would be to leave the drain in place. She has an appointment soon to see her primary care physician. If he feels that she is a candidate for surgery, would recommend laparoscopic cholecystectomy. We will leave the drain in place until the time of surgery. We will wait to hear back from Dr. Reyes Ivan. Georgette Dover, MD, [facility] Surgery  General/ Trauma Surgery  01/04/2014 1:43 PM

## 2014-01-15 ENCOUNTER — Emergency Department (HOSPITAL_COMMUNITY): Payer: Medicare Other

## 2014-01-15 ENCOUNTER — Encounter (HOSPITAL_COMMUNITY): Payer: Self-pay | Admitting: Emergency Medicine

## 2014-01-15 ENCOUNTER — Inpatient Hospital Stay (HOSPITAL_COMMUNITY)
Admission: EM | Admit: 2014-01-15 | Discharge: 2014-01-20 | DRG: 189 | Disposition: A | Discharge status: Home Health | Payer: Medicare Other | Attending: Internal Medicine | Admitting: Internal Medicine

## 2014-01-15 DIAGNOSIS — IMO0001 Reserved for inherently not codable concepts without codable children: Secondary | ICD-10-CM | POA: Diagnosis present

## 2014-01-15 DIAGNOSIS — K219 Gastro-esophageal reflux disease without esophagitis: Secondary | ICD-10-CM | POA: Diagnosis present

## 2014-01-15 DIAGNOSIS — F17200 Nicotine dependence, unspecified, uncomplicated: Secondary | ICD-10-CM | POA: Diagnosis present

## 2014-01-15 DIAGNOSIS — J449 Chronic obstructive pulmonary disease, unspecified: Secondary | ICD-10-CM | POA: Diagnosis present

## 2014-01-15 DIAGNOSIS — G4700 Insomnia, unspecified: Secondary | ICD-10-CM | POA: Diagnosis present

## 2014-01-15 DIAGNOSIS — Z8249 Family history of ischemic heart disease and other diseases of the circulatory system: Secondary | ICD-10-CM | POA: Diagnosis not present

## 2014-01-15 DIAGNOSIS — M1990 Unspecified osteoarthritis, unspecified site: Secondary | ICD-10-CM | POA: Diagnosis present

## 2014-01-15 DIAGNOSIS — Z720 Tobacco use: Secondary | ICD-10-CM | POA: Diagnosis present

## 2014-01-15 DIAGNOSIS — G9340 Encephalopathy, unspecified: Secondary | ICD-10-CM

## 2014-01-15 DIAGNOSIS — E860 Dehydration: Secondary | ICD-10-CM

## 2014-01-15 DIAGNOSIS — R0689 Other abnormalities of breathing: Secondary | ICD-10-CM

## 2014-01-15 DIAGNOSIS — J0100 Acute maxillary sinusitis, unspecified: Secondary | ICD-10-CM | POA: Diagnosis present

## 2014-01-15 DIAGNOSIS — E872 Acidosis, unspecified: Secondary | ICD-10-CM | POA: Diagnosis present

## 2014-01-15 DIAGNOSIS — I509 Heart failure, unspecified: Secondary | ICD-10-CM | POA: Diagnosis present

## 2014-01-15 DIAGNOSIS — I5032 Chronic diastolic (congestive) heart failure: Secondary | ICD-10-CM | POA: Diagnosis present

## 2014-01-15 DIAGNOSIS — R7989 Other specified abnormal findings of blood chemistry: Secondary | ICD-10-CM

## 2014-01-15 DIAGNOSIS — Z853 Personal history of malignant neoplasm of breast: Secondary | ICD-10-CM

## 2014-01-15 DIAGNOSIS — M549 Dorsalgia, unspecified: Secondary | ICD-10-CM | POA: Diagnosis present

## 2014-01-15 DIAGNOSIS — I4891 Unspecified atrial fibrillation: Secondary | ICD-10-CM | POA: Diagnosis present

## 2014-01-15 DIAGNOSIS — J441 Chronic obstructive pulmonary disease with (acute) exacerbation: Secondary | ICD-10-CM

## 2014-01-15 DIAGNOSIS — E785 Hyperlipidemia, unspecified: Secondary | ICD-10-CM | POA: Diagnosis present

## 2014-01-15 DIAGNOSIS — E8729 Other acidosis: Secondary | ICD-10-CM

## 2014-01-15 DIAGNOSIS — F329 Major depressive disorder, single episode, unspecified: Secondary | ICD-10-CM | POA: Diagnosis present

## 2014-01-15 DIAGNOSIS — R4182 Altered mental status, unspecified: Secondary | ICD-10-CM | POA: Diagnosis present

## 2014-01-15 DIAGNOSIS — D3500 Benign neoplasm of unspecified adrenal gland: Secondary | ICD-10-CM | POA: Diagnosis present

## 2014-01-15 DIAGNOSIS — G8929 Other chronic pain: Secondary | ICD-10-CM | POA: Diagnosis present

## 2014-01-15 DIAGNOSIS — J4489 Other specified chronic obstructive pulmonary disease: Secondary | ICD-10-CM | POA: Diagnosis present

## 2014-01-15 DIAGNOSIS — F411 Generalized anxiety disorder: Secondary | ICD-10-CM | POA: Diagnosis present

## 2014-01-15 DIAGNOSIS — I2510 Atherosclerotic heart disease of native coronary artery without angina pectoris: Secondary | ICD-10-CM | POA: Diagnosis present

## 2014-01-15 DIAGNOSIS — F3289 Other specified depressive episodes: Secondary | ICD-10-CM | POA: Diagnosis present

## 2014-01-15 DIAGNOSIS — T40605A Adverse effect of unspecified narcotics, initial encounter: Secondary | ICD-10-CM | POA: Diagnosis present

## 2014-01-15 DIAGNOSIS — Z9010 Acquired absence of unspecified breast and nipple: Secondary | ICD-10-CM | POA: Diagnosis not present

## 2014-01-15 DIAGNOSIS — J9622 Acute and chronic respiratory failure with hypercapnia: Secondary | ICD-10-CM

## 2014-01-15 DIAGNOSIS — Z800 Family history of malignant neoplasm of digestive organs: Secondary | ICD-10-CM | POA: Diagnosis not present

## 2014-01-15 DIAGNOSIS — Z9981 Dependence on supplemental oxygen: Secondary | ICD-10-CM | POA: Diagnosis not present

## 2014-01-15 DIAGNOSIS — J9620 Acute and chronic respiratory failure, unspecified whether with hypoxia or hypercapnia: Principal | ICD-10-CM | POA: Diagnosis present

## 2014-01-15 DIAGNOSIS — R945 Abnormal results of liver function studies: Secondary | ICD-10-CM | POA: Diagnosis present

## 2014-01-15 LAB — BLOOD GAS, ARTERIAL
ACID-BASE EXCESS: 6.7 mmol/L — AB (ref 0.0–2.0)
Bicarbonate: 33.4 mEq/L — ABNORMAL HIGH (ref 20.0–24.0)
Delivery systems: POSITIVE
Expiratory PAP: 6
FIO2: 0.5 %
Inspiratory PAP: 14
MODE: POSITIVE
O2 SAT: 99.9 %
PO2 ART: 140 mmHg — AB (ref 80.0–100.0)
Patient temperature: 98.6
TCO2: 35.7 mmol/L (ref 0–100)
pCO2 arterial: 75.2 mmHg (ref 35.0–45.0)
pH, Arterial: 7.269 — ABNORMAL LOW (ref 7.350–7.450)

## 2014-01-15 LAB — I-STAT CHEM 8, ED
BUN: 15 mg/dL (ref 6–23)
CREATININE: 0.7 mg/dL (ref 0.50–1.10)
Calcium, Ion: 1.36 mmol/L — ABNORMAL HIGH (ref 1.13–1.30)
Chloride: 95 mEq/L — ABNORMAL LOW (ref 96–112)
GLUCOSE: 113 mg/dL — AB (ref 70–99)
HEMATOCRIT: 40 % (ref 36.0–46.0)
Hemoglobin: 13.6 g/dL (ref 12.0–15.0)
POTASSIUM: 4.1 meq/L (ref 3.7–5.3)
Sodium: 139 mEq/L (ref 137–147)
TCO2: 34 mmol/L (ref 0–100)

## 2014-01-15 LAB — RAPID URINE DRUG SCREEN, HOSP PERFORMED
Amphetamines: NOT DETECTED
BARBITURATES: NOT DETECTED
BENZODIAZEPINES: NOT DETECTED
COCAINE: NOT DETECTED
Opiates: NOT DETECTED
Tetrahydrocannabinol: NOT DETECTED

## 2014-01-15 LAB — SALICYLATE LEVEL: Salicylate Lvl: <2 mg/dL — ABNORMAL LOW (ref 2.8–20.0)

## 2014-01-15 LAB — CBC WITH DIFFERENTIAL/PLATELET
Basophils Absolute: 0 10*3/uL (ref 0.0–0.1)
Basophils Relative: 0 % (ref 0–1)
EOS ABS: 0.1 10*3/uL (ref 0.0–0.7)
Eosinophils Relative: 1 % (ref 0–5)
HCT: 38.3 % (ref 36.0–46.0)
Hemoglobin: 11.4 g/dL — ABNORMAL LOW (ref 12.0–15.0)
Lymphocytes Relative: 17 % (ref 12–46)
Lymphs Abs: 1.3 10*3/uL (ref 0.7–4.0)
MCH: 31.1 pg (ref 26.0–34.0)
MCHC: 29.8 g/dL — AB (ref 30.0–36.0)
MCV: 104.6 fL — ABNORMAL HIGH (ref 78.0–100.0)
MONOS PCT: 8 % (ref 3–12)
Monocytes Absolute: 0.6 10*3/uL (ref 0.1–1.0)
NEUTROS PCT: 74 % (ref 43–77)
Neutro Abs: 5.4 10*3/uL (ref 1.7–7.7)
PLATELETS: 167 10*3/uL (ref 150–400)
RBC: 3.66 MIL/uL — ABNORMAL LOW (ref 3.87–5.11)
RDW: 14.4 % (ref 11.5–15.5)
WBC: 7.4 10*3/uL (ref 4.0–10.5)

## 2014-01-15 LAB — URINE MICROSCOPIC-ADD ON

## 2014-01-15 LAB — COMPREHENSIVE METABOLIC PANEL
ALT: 301 U/L — ABNORMAL HIGH (ref 0–35)
ANION GAP: 8 (ref 5–15)
AST: 498 U/L — ABNORMAL HIGH (ref 0–37)
Albumin: 3.7 g/dL (ref 3.5–5.2)
Alkaline Phosphatase: 131 U/L — ABNORMAL HIGH (ref 39–117)
BUN: 14 mg/dL (ref 6–23)
CO2: 36 mEq/L — ABNORMAL HIGH (ref 19–32)
Calcium: 9.9 mg/dL (ref 8.4–10.5)
Chloride: 99 mEq/L (ref 96–112)
Creatinine, Ser: 0.47 mg/dL — ABNORMAL LOW (ref 0.50–1.10)
GFR calc Af Amer: >90 mL/min (ref >90)
GFR calc non Af Amer: >90 mL/min (ref >90)
Glucose, Bld: 108 mg/dL — ABNORMAL HIGH (ref 70–99)
Potassium: 4.3 mEq/L (ref 3.7–5.3)
SODIUM: 143 meq/L (ref 137–147)
TOTAL PROTEIN: 6.6 g/dL (ref 6.0–8.3)
Total Bilirubin: 0.8 mg/dL (ref 0.3–1.2)

## 2014-01-15 LAB — I-STAT ARTERIAL BLOOD GAS, ED
ACID-BASE EXCESS: 9 mmol/L — AB (ref 0.0–2.0)
BICARBONATE: 39.8 meq/L — AB (ref 20.0–24.0)
O2 Saturation: 90 %
PCO2 ART: 87.1 mmHg — AB (ref 35.0–45.0)
PO2 ART: 72 mmHg — AB (ref 80.0–100.0)
TCO2: 42 mmol/L (ref 0–100)
pH, Arterial: 7.268 — ABNORMAL LOW (ref 7.350–7.450)

## 2014-01-15 LAB — URINALYSIS, ROUTINE W REFLEX MICROSCOPIC
BILIRUBIN URINE: NEGATIVE
Glucose, UA: NEGATIVE mg/dL
Hgb urine dipstick: NEGATIVE
Ketones, ur: NEGATIVE mg/dL
Nitrite: NEGATIVE
PH: 5 (ref 5.0–8.0)
Protein, ur: NEGATIVE mg/dL
SPECIFIC GRAVITY, URINE: 1.018 (ref 1.005–1.030)
Urobilinogen, UA: 0.2 mg/dL (ref 0.0–1.0)

## 2014-01-15 LAB — I-STAT CG4 LACTIC ACID, ED: Lactic Acid, Venous: 0.51 mmol/L (ref 0.5–2.2)

## 2014-01-15 LAB — I-STAT TROPONIN, ED: Troponin i, poc: 0 ng/mL (ref 0.00–0.08)

## 2014-01-15 LAB — ETHANOL: Alcohol, Ethyl (B): <11 mg/dL (ref 0–11)

## 2014-01-15 LAB — MRSA PCR SCREENING: MRSA BY PCR: NEGATIVE

## 2014-01-15 LAB — PROTIME-INR
INR: 0.91 (ref 0.00–1.49)
Prothrombin Time: 12.3 seconds (ref 11.6–15.2)

## 2014-01-15 LAB — ACETAMINOPHEN LEVEL: Acetaminophen (Tylenol), Serum: <15 ug/mL (ref 10–30)

## 2014-01-15 LAB — AMMONIA: Ammonia: 49 umol/L (ref 11–60)

## 2014-01-15 MED ORDER — METHYLPREDNISOLONE SODIUM SUCC 125 MG IJ SOLR
125.0000 mg | Freq: Once | INTRAMUSCULAR | Status: AC
  Administered 2014-01-15: 125 mg via INTRAVENOUS
  Filled 2014-01-15: qty 2

## 2014-01-15 MED ORDER — NALOXONE HCL 0.4 MG/ML IJ SOLN
0.4000 mg | Freq: Once | INTRAMUSCULAR | Status: AC
  Administered 2014-01-15: 0.4 mg via INTRAVENOUS
  Filled 2014-01-15: qty 1

## 2014-01-15 MED ORDER — PIPERACILLIN-TAZOBACTAM 3.375 G IVPB
3.3750 g | Freq: Three times a day (TID) | INTRAVENOUS | Status: DC
  Administered 2014-01-15 – 2014-01-17 (×6): 3.375 g via INTRAVENOUS
  Filled 2014-01-15 (×7): qty 50

## 2014-01-15 MED ORDER — ALBUTEROL SULFATE (2.5 MG/3ML) 0.083% IN NEBU
2.5000 mg | INHALATION_SOLUTION | Freq: Four times a day (QID) | RESPIRATORY_TRACT | Status: DC
  Administered 2014-01-15 – 2014-01-16 (×5): 2.5 mg via RESPIRATORY_TRACT
  Filled 2014-01-15 (×5): qty 3

## 2014-01-15 MED ORDER — IPRATROPIUM BROMIDE 0.02 % IN SOLN
0.5000 mg | Freq: Four times a day (QID) | RESPIRATORY_TRACT | Status: DC
  Administered 2014-01-15 – 2014-01-16 (×5): 0.5 mg via RESPIRATORY_TRACT
  Filled 2014-01-15 (×7): qty 2.5

## 2014-01-15 MED ORDER — ONDANSETRON HCL 4 MG PO TABS
4.0000 mg | ORAL_TABLET | Freq: Four times a day (QID) | ORAL | Status: DC | PRN
  Administered 2014-01-18 – 2014-01-20 (×2): 4 mg via ORAL
  Filled 2014-01-15 (×2): qty 1

## 2014-01-15 MED ORDER — SODIUM CHLORIDE 0.9 % IJ SOLN
3.0000 mL | Freq: Two times a day (BID) | INTRAMUSCULAR | Status: DC
  Administered 2014-01-15 – 2014-01-19 (×5): 3 mL via INTRAVENOUS

## 2014-01-15 MED ORDER — DEXTROSE-NACL 5-0.45 % IV SOLN
INTRAVENOUS | Status: AC
  Administered 2014-01-15: 500 mL via INTRAVENOUS

## 2014-01-15 MED ORDER — ENOXAPARIN SODIUM 40 MG/0.4ML ~~LOC~~ SOLN
40.0000 mg | SUBCUTANEOUS | Status: DC
  Administered 2014-01-15 – 2014-01-19 (×5): 40 mg via SUBCUTANEOUS
  Filled 2014-01-15 (×6): qty 0.4

## 2014-01-15 MED ORDER — ALBUTEROL SULFATE (2.5 MG/3ML) 0.083% IN NEBU: 2.5000 mg | INHALATION_SOLUTION | RESPIRATORY_TRACT | Status: DC | PRN

## 2014-01-15 MED ORDER — METHYLPREDNISOLONE SODIUM SUCC 40 MG IJ SOLR
40.0000 mg | Freq: Two times a day (BID) | INTRAMUSCULAR | Status: AC
  Administered 2014-01-16 (×2): 40 mg via INTRAVENOUS
  Filled 2014-01-15 (×2): qty 1

## 2014-01-15 MED ORDER — IPRATROPIUM-ALBUTEROL 0.5-2.5 (3) MG/3ML IN SOLN
3.0000 mL | RESPIRATORY_TRACT | Status: DC
  Administered 2014-01-15: 3 mL via RESPIRATORY_TRACT
  Filled 2014-01-15: qty 3

## 2014-01-15 MED ORDER — PIPERACILLIN-TAZOBACTAM 3.375 G IVPB 30 MIN
3.3750 g | Freq: Once | INTRAVENOUS | Status: AC
  Administered 2014-01-15: 3.375 g via INTRAVENOUS
  Filled 2014-01-15: qty 50

## 2014-01-15 MED ORDER — SODIUM CHLORIDE 0.9 % IV BOLUS (SEPSIS)
1000.0000 mL | Freq: Once | INTRAVENOUS | Status: AC
  Administered 2014-01-15: 1000 mL via INTRAVENOUS

## 2014-01-15 MED ORDER — ONDANSETRON HCL 4 MG/2ML IJ SOLN
4.0000 mg | Freq: Four times a day (QID) | INTRAMUSCULAR | Status: DC | PRN
Start: 2014-01-15 — End: 2014-01-20
  Administered 2014-01-17 – 2014-01-18 (×2): 4 mg via INTRAVENOUS
  Filled 2014-01-15 (×2): qty 2

## 2014-01-15 NOTE — H&P (Addendum)
History and Physical  Theresa Blackwell WNU:[id number] DOB: 1930/08/31 DOA: 01/15/2014  Referring physician: Dr. Pryor Curia, EDP. PCP: Wenda Low, MD  Outpatient Specialists:  1. Urology: Dr. Beulah Gandy. 2. General Surgery: Dr. Jaymes Graff  Chief Complaint: altered mental status  HPI: Theresa Blackwell is a 80 y.o. female with history of chronic respiratory failure on 3 L per minute home oxygen, COPD, chronic diastolic CHF, ongoing tobacco abuse, acute cholecystitis status post cholecystostomy tube placement, CAD, GERD, breast cancer status post right mastectomy and frequent UTIs, presented to the ED with above complaints. The patient is unable to provide any history secondary to altered mental status. History is obtained from patient's son/HCPOA Theresa Blackwell and patient's grandson who are at the bedside. Patient apparently was in her usual state of health until this morning. This morning patient refused to get out of bed, appeared lethargic, sleepy and confused. No reported history of any other complaints. No reported history of dyspnea, cough, chest pain, headache, fever, chills, abdominal pain, nausea, vomiting, seizure like activity or strokelike symptoms. As per family, patient is known to take extra strength Tylenol tablet more than given to her by them (exact amount unknown). In the ED, patient was unable to answer questions or follow commands. ABG suggestive of acute hypercapnic and hypoxic respiratory failure, AST and ALT elevated and 400s which were almost normal 2 months ago. Hemoglobin 11.4, CT head without stroke but suggesting right maxillary sinusitis and chest x-ray without acute cardiopulmonary disease. Patient was placed on BiPAP. No significant response to a dose of Narcan. She was also given a dose of Solu-Medrol. Hospitalist admission requested. Patient MS is slightly better.   Review of Systems: All systems reviewed and apart from history of presenting illness, are  negative.  Past Medical History  Diagnosis Date  . Anxiety   . Smoking   . Fibromyalgia   . Depression   . Hyperlipidemia   . Hypercalcemia   . Adrenal adenoma     Stable as of 05/2012 CT scan  . Chronic respiratory failure   . Coronary artery disease   . GERD (gastroesophageal reflux disease)   . Lymphedema syndrome, postmastectomy     RUE  . Breast CA 1970    rt breast  . CHF (congestive heart failure)   . COPD (chronic obstructive pulmonary disease)   . On home oxygen therapy     "3L; 24/7" (11/11/2013)  . DJD (degenerative joint disease)   . Arthritis     "joints" (11/11/2013)  . Chronic back pain     "shoulders to lower back" (11/11/2013)  . Frequent UTI    Past Surgical History  Procedure Laterality Date  . Abdominal hysterectomy    . Appendectomy    . Squamous cell carcinoma excision      Floor of mouth  . Colonoscopy w/ biopsies and polypectomy    . Nasal sinus surgery  1994  . Eus  04/03/2012    Procedure: UPPER ENDOSCOPIC ULTRASOUND (EUS) LINEAR;  Surgeon: Milus Banister, MD;  Location: [facility];  Service: Endoscopy;  Laterality: N/A;  radial linear  . Mastectomy, radical Right 1970  . Breast biopsy Right ~ 1970  . Refractive surgery Bilateral ~ 2012  . Back surgery    . Posterior lumbar fusion  ?1970's   Social History:  reports that she has been smoking Cigarettes.  She has a 192 pack-year smoking history. She has never used smokeless tobacco. She reports that she does not  drink alcohol or use illicit drugs. Single. Lives with family. Ambulates with the help of a cane or a walker.  No Known Allergies  Family History  Problem Relation Age of Onset  . Colon cancer Brother   . Heart disease Father     Prior to Admission medications   Medication Sig Start Date End Date Taking? Authorizing Provider  albuterol (PROVENTIL) (2.5 MG/3ML) 0.083% nebulizer solution Take 2.5 mg by nebulization every 6 (six) hours as needed for wheezing or shortness of  breath.   Yes Historical Provider, MD  ergocalciferol (VITAMIN D2) 50000 UNITS capsule Take 50,000 Units by mouth every 14 (fourteen) days.    Yes Historical Provider, MD  fentaNYL (DURAGESIC - DOSED MCG/HR) 50 MCG/HR Place 1 patch (50 mcg total) onto the skin every 3 (three) days. 11/13/13  Yes Wenda Low, MD  fluticasone (FLONASE) 50 MCG/ACT nasal spray Place 2 sprays into the nose daily as needed. For allergies    Yes Historical Provider, MD  furosemide (LASIX) 20 MG tablet Take 1 tablet (20 mg total) by mouth daily. 11/13/13  Yes Wenda Low, MD  magnesium hydroxide (MILK OF MAGNESIA) 400 MG/5ML suspension Take 30 mLs by mouth daily as needed for mild constipation. 11/16/13  Yes Horton Finer, MD  Multiple Vitamin (MULTIVITAMIN WITH MINERALS) TABS Take 1 tablet by mouth daily. 03/09/12  Yes Wenda Low, MD  omeprazole (PRILOSEC) 20 MG capsule Take 20 mg by mouth daily.     Yes Historical Provider, MD  polyethylene glycol (MIRALAX / GLYCOLAX) packet Take 17 g by mouth daily.   Yes Historical Provider, MD  potassium chloride (K-DUR,KLOR-CON) 10 MEQ tablet Take 1 tablet (10 mEq total) by mouth daily. 11/13/13  Yes Wenda Low, MD  pregabalin (LYRICA) 75 MG capsule Take 1 capsule (75 mg total) by mouth 2 (two) times daily. 03/10/12  Yes Wenda Low, MD  sertraline (ZOLOFT) 50 MG tablet Take 1 tablet (50 mg total) by mouth daily. 11/13/13  Yes Wenda Low, MD  tiotropium (SPIRIVA) 18 MCG inhalation capsule Place 18 mcg into inhaler and inhale daily.     Yes Historical Provider, MD  tiZANidine (ZANAFLEX) 4 MG capsule Take 4 mg by mouth at bedtime.     Yes Historical Provider, MD  triamcinolone cream (KENALOG) 0.1 % Apply 1 application topically 2 (two) times daily as needed. Swelling in right arm.   Yes Historical Provider, MD  verapamil (CALAN-SR) 120 MG CR tablet Take 1 tablet (120 mg total) by mouth daily. 11/13/13  Yes Wenda Low, MD  mirtazapine (REMERON) 7.5 MG tablet Take 7.5 mg  by mouth daily. 01/15/14   Historical Provider, MD   Physical Exam: Filed Vitals:   01/15/14 1334 01/15/14 1415 01/15/14 1430 01/15/14 1458  BP:  135/95 132/53   Pulse: 88 72 76 86  Temp:      TempSrc:      Resp: 25 17 19 20   SpO2: 96% 100% 100% 98%     General exam: Moderately built and poorly nourished chronically ill-looking female patient lying comfortably propped up on the gurney with BiPAP.  Head, eyes and ENT: Nontraumatic and normocephalic. Pupils equally reacting to light and accommodation. Oral mucosa appears dry.  Neck: Supple. No JVD, carotid bruit or thyromegaly.  Lymphatics: No lymphadenopathy.  Respiratory system: Distant breath sounds but appears clear to auscultation. No increased work of breathing. BiPAP +  Cardiovascular system: S1 and S2 heard, RRR. No JVD, murmurs, gallops, clicks or pedal edema.  Gastrointestinal system: Abdomen  is nondistended, soft and nontender. Normal bowel sounds heard. No organomegaly or masses appreciated. Cholecystostomy tube right upper quadrant draining bilious liquid that is not foul-smelling.  Central nervous system: Drowsy. Briefly opens eyes to call but does not sustain. No verbal or motor response. Does not follow commands. No focal neurological deficits.  Extremities: Unable to assess power. Peripheral pulses symmetrically felt.   Skin: No rashes or acute findings.  Musculoskeletal system: Negative exam.  Psychiatry: Unable to assess secondary to altered mental status.   Labs on Admission:  Basic Metabolic Panel:  Recent Labs Lab 01/15/14 1315 01/15/14 1333  NA 143 139  K 4.3 4.1  CL 99 95*  CO2 36*  --   GLUCOSE 108* 113*  BUN 14 15  CREATININE 0.47* 0.70  CALCIUM 9.9  --    Liver Function Tests:  Recent Labs Lab 01/15/14 1315  AST 498*  ALT 301*  ALKPHOS 131*  BILITOT 0.8  PROT 6.6  ALBUMIN 3.7   No results found for this basename: LIPASE, AMYLASE,  in the last 168 hours No results found for  this basename: AMMONIA,  in the last 168 hours CBC:  Recent Labs Lab 01/15/14 1315 01/15/14 1333  WBC 7.4  --   NEUTROABS 5.4  --   HGB 11.4* 13.6  HCT 38.3 40.0  MCV 104.6*  --   PLT 167  --    Cardiac Enzymes: No results found for this basename: CKTOTAL, CKMB, CKMBINDEX, TROPONINI,  in the last 168 hours  BNP (last 3 results) No results found for this basename: PROBNP,  in the last 8760 hours CBG: No results found for this basename: GLUCAP,  in the last 168 hours  Radiological Exams on Admission: Ct Head Wo Contrast  01/15/2014   CLINICAL DATA:  Altered mental status. ; history of CHF, breast malignancy, and COPD  EXAM: CT HEAD WITHOUT CONTRAST  TECHNIQUE: Contiguous axial images were obtained from the base of the skull through the vertex without intravenous contrast.  COMPARISON:  Sinus CT scan dated May 31, 2003  FINDINGS: There is mild age appropriate diffuse cerebral atrophy with compensatory ventriculomegaly. There is symmetric decreased density in the deep white matter of both cerebral hemispheres consistent with chronic small vessel ischemia. There is no acute intracranial hemorrhage nor evidence of an acute ischemic event. The cerebellum and brainstem are normal.  There is fluid in the right maxillary sinus. The observed bony margins of the sinus are normal. Fluid was present bilaterally in the maxillary sinuses on the previous CT scan. The other paranasal sinuses are clear. The mastoid air cells are well pneumatized. There is no skull fracture.  IMPRESSION: 1. There is no acute ischemic or hemorrhagic event. 2. There are age related changes of chronic small vessel ischemia and acute atrophy. 3. Fluid in the right maxillary sinus in the absence of facial trauma is consistent with acute sinusitis. This may impact the patient's mental status. 4. There is no acute skull fracture.   Electronically Signed   By: David  Martinique   On: 01/15/2014 14:30   Dg Chest Port 1  View  01/15/2014   CLINICAL DATA:  Acute mental status changes.  Long-time smoker.  EXAM: PORTABLE CHEST - 1 VIEW  COMPARISON:  Two-view chest x-ray 11/11/2013, 02/02/2012, 05/30/2005. Portable chest x-ray 03/05/2012. CTA chest 06/05/2011.  FINDINGS: Patient rotated to the right. Cardiac silhouette mildly enlarged but stable. Thoracic aorta tortuous and atherosclerotic, unchanged. Right apical pleuroparenchymal scarring, unchanged when accounting for differences in  technique. Lungs otherwise clear. No pneumothorax. Pulmonary vascularity normal. Costophrenic angles excluded from the image.  IMPRESSION: No acute cardiopulmonary disease. Stable right apical pleuroparenchymal scarring.   Electronically Signed   By: Evangeline Dakin M.D.   On: 01/15/2014 13:55    EKG: Independently reviewed. Sinus rhythm, occasional PACs, incomplete RBBB and LAFB, T wave inversion in inferior leads and Q waves in leads V1-2. No acute changes.  Assessment/Plan Principal Problem:   Acute encephalopathy Active Problems:   HX: breast cancer   Tobacco abuse   Fibromyalgia   GERD (gastroesophageal reflux disease)   HLD (hyperlipidemia)   Chronic diastolic heart failure   Physical deconditioning   Acute on chronic respiratory failure with hypercapnia   Abnormal LFTs   COPD (chronic obstructive pulmonary disease)   1. Acute encephalopathy: Likely secondary to acute on chronic hypercapnic and hypoxic respiratory failure. Infectious etiology seems less likely in the absence of fever, leukocytosis and signs and symptoms. CT head negative for acute findings. Other differential diagnoses may be narcotic pain medications but no significant response to Narcan. Admit to step down unit. Treat hypercapnia and monitor closely. Check ammonia, acetaminophen and salicylate levels. Monitor for improvement has hypercapnia improved otherwise may have to look for other etiologies. N.p.o. 2. Acute on chronic hypercapnic and hypoxic  respiratory failure/severe COPD: Unclear precipitating cause for acute decline. Continue supportive treatment with oxygen, BiPAP, IV Solu-Medrol, bronchodilator nebulizations. Currently maintaining airway and hemodynamically stable. 3. Abnormal LFTs/history of acute cholecystitis status post cholecystostomy tube: Not candidate for surgery. No symptoms and signs suggestive of acute infection. Unclear etiology. Cholecystostomy seems to be draining well. Rule out acetaminophen toxicity. N.p.o. IV fluids and follow CMP in a.m. 4. Tobacco abuse: Patient apparently has been counseled repeatedly in the past but continues to smoke. 5. GERD: IV PPI 6. History of chronic diastolic CHF: Seems slightly dehydrated. Hold Lasix. Brief IV fluids. 7. Mild dehydration: IV fluids 8. History of cancer, status post right mastectomy: Chronic right upper extremity edema- has compressive sleeve 9. Acute right maxillary sinusitis on CT: Zosyn should cover.     Code Status: Full code  Family Communication: Discussed with patient's son/HCPOA Mr. Zyriah Mask and patient's grandson at bedside.  Disposition Plan: Admit to step down unit.   Time spent: 61 minutes  Crespin Forstrom, MD, FACP, FHM. [facility] Pager [phone]  If 7PM-7AM, please contact night-coverage [email or web address] Password [facility] 01/15/2014, 4:37 PM

## 2014-01-15 NOTE — ED Notes (Signed)
Pt transported to CT by RT, Melissa RN, and this RN

## 2014-01-15 NOTE — ED Notes (Signed)
This RN and RT Merry Proud accompanied patient to CT.

## 2014-01-15 NOTE — ED Notes (Signed)
Respiratory at bedside.

## 2014-01-15 NOTE — ED Notes (Signed)
Pt from home via [facility] Pt was with family when they noticed that the patient was "different" (no description provided). EMS reporting that pt was a/o at baseline upon arrival to scene, but deteriorated en route. Denies fever. Pt currently lethargic and disoriented to time. MD aware of symptoms.

## 2014-01-15 NOTE — ED Notes (Signed)
Lactic Acid results reported to Dr.Ward. ED-Lab.

## 2014-01-15 NOTE — Progress Notes (Signed)
Unit CM UR Completed by [facility] ED CM  W. Petra Dumler RN  

## 2014-01-15 NOTE — ED Notes (Addendum)
Merry Proud, RT notified of need for assistance to transport patient upstairs. Admitting MD at bedside.

## 2014-01-15 NOTE — ED Notes (Signed)
Respiratory contacted to assist in transporting patient to CT. CT notified of delay.

## 2014-01-15 NOTE — Progress Notes (Signed)
Admission history incomplete due to patient unable to answer some questions

## 2014-01-15 NOTE — Progress Notes (Signed)
Patient ID: Theresa Blackwell, female   DOB: 1931-04-20, 80 y.o.   MRN: [id number] Pt is well known to [place].  She comes in today with respiratory failure and potential tylenol toxicity and elev LFTs.  Pt is a poor surgical candidate and had a cholecystostomy tube in place for cholecystitis.  C-tube cholangiogram showed occluded cystic duct.  C-tube con't with active biliary drainage.   Pt con't as poor surgical candidate as per her respiratory status.  If elev LFTs were sure to cholecystitis, cholecystostomy tube would be the treatment of choice.  No surgical plans.  I discussed this with Dr. Lindaann Pascal.

## 2014-01-15 NOTE — ED Notes (Signed)
Fentanyl patch removed per Dr. Leonides Schanz.

## 2014-01-15 NOTE — ED Notes (Signed)
Attempted report x1. 

## 2014-01-15 NOTE — ED Provider Notes (Signed)
TIME SEEN: 12:57 PM  CHIEF COMPLAINT: Altered mental status  HPI: Patient is a 80 y.o. F with history of depression, fibromyalgia, chronic respiratory failure, COPD on 3 L of oxygen at home, frequent urinary tract infections who presents emergency department with altered mental status. Per EMS, family reports the patient was acting differently but could not further describe. In the emergency department, patient is unable to answer questions or follow commands. EMS reports this is not her baseline. She had a normal CBG with EMS. Normal vital signs.   ROS: Unobtainable secondary to altered mental status  PAST MEDICAL HISTORY/PAST SURGICAL HISTORY:  Past Medical History  Diagnosis Date  . Anxiety   . Smoking   . Fibromyalgia   . Depression   . Hyperlipidemia   . Hypercalcemia   . Adrenal adenoma     Stable as of 05/2012 CT scan  . Chronic respiratory failure   . Coronary artery disease   . GERD (gastroesophageal reflux disease)   . Lymphedema syndrome, postmastectomy     RUE  . Breast CA 1970    rt breast  . CHF (congestive heart failure)   . COPD (chronic obstructive pulmonary disease)   . On home oxygen therapy     "3L; 24/7" (11/11/2013)  . DJD (degenerative joint disease)   . Arthritis     "joints" (11/11/2013)  . Chronic back pain     "shoulders to lower back" (11/11/2013)  . Frequent UTI     MEDICATIONS:  Prior to Admission medications   Medication Sig Start Date End Date Taking? Authorizing Provider  albuterol (PROVENTIL) (2.5 MG/3ML) 0.083% nebulizer solution Take 2.5 mg by nebulization every 6 (six) hours as needed for wheezing or shortness of breath.    Historical Provider, MD  ergocalciferol (VITAMIN D2) 50000 UNITS capsule Take 50,000 Units by mouth every 14 (fourteen) days.     Historical Provider, MD  fentaNYL (DURAGESIC - DOSED MCG/HR) 50 MCG/HR Place 1 patch (50 mcg total) onto the skin every 3 (three) days. 11/13/13   Wenda Low, MD  fluticasone (FLONASE) 50  MCG/ACT nasal spray Place 2 sprays into the nose daily as needed. For allergies     Historical Provider, MD  furosemide (LASIX) 20 MG tablet Take 1 tablet (20 mg total) by mouth daily. 11/13/13   Wenda Low, MD  magnesium hydroxide (MILK OF MAGNESIA) 400 MG/5ML suspension Take 30 mLs by mouth daily as needed for mild constipation. 11/16/13   Horton Finer, MD  Multiple Vitamin (MULTIVITAMIN WITH MINERALS) TABS Take 1 tablet by mouth daily. 03/09/12   Wenda Low, MD  omeprazole (PRILOSEC) 20 MG capsule Take 20 mg by mouth daily.      Historical Provider, MD  polyethylene glycol (MIRALAX / GLYCOLAX) packet Take 17 g by mouth daily.    Historical Provider, MD  potassium chloride (K-DUR,KLOR-CON) 10 MEQ tablet Take 1 tablet (10 mEq total) by mouth daily. 11/13/13   Wenda Low, MD  pregabalin (LYRICA) 75 MG capsule Take 1 capsule (75 mg total) by mouth 2 (two) times daily. 03/10/12   Wenda Low, MD  sertraline (ZOLOFT) 50 MG tablet Take 1 tablet (50 mg total) by mouth daily. 11/13/13   Wenda Low, MD  tiotropium (SPIRIVA) 18 MCG inhalation capsule Place 18 mcg into inhaler and inhale daily.      Historical Provider, MD  tiZANidine (ZANAFLEX) 4 MG capsule Take 4 mg by mouth at bedtime.      Historical Provider, MD  triamcinolone cream (KENALOG)  0.1 % Apply 1 application topically 2 (two) times daily as needed. Swelling in right arm.    Historical Provider, MD  verapamil (CALAN-SR) 120 MG CR tablet Take 1 tablet (120 mg total) by mouth daily. 11/13/13   Wenda Low, MD    ALLERGIES:  No Known Allergies  SOCIAL HISTORY:  History  Substance Use Topics  . Smoking status: Current Every Day Smoker -- 3.00 packs/day for 64 years    Types: Cigarettes  . Smokeless tobacco: Never Used  . Alcohol Use: No     Comment: stopped in 2013 but drank 1/5 every couple of days    FAMILY HISTORY: Family History  Problem Relation Age of Onset  . Colon cancer Brother   . Heart disease Father      EXAM: SpO2 92% CONSTITUTIONAL: Alert and oriented to person and place but not to time and answers questions intermittently and will follow commands intermittently HEAD: Normocephalic EYES: Conjunctivae clear, PERRL, pupils are 3-4 mm bilaterally.  ENT: normal nose; no rhinorrhea; moist mucous membranes; pharynx without lesions noted NECK: Supple, no meningismus, no LAD  CARD: RRR; S1 and S2 appreciated; no murmurs, no clicks, no rubs, no gallops RESP: Normal chest excursion without splinting or tachypnea; breath sounds clear and equal bilaterally; no wheezes, no rhonchi, no rales,  ABD/GI: Normal bowel sounds; non-distended; soft, non-tender, no rebound, no guarding BACK:  The back appears normal and is non-tender to palpation, there is no CVA tenderness EXT: Normal ROM in all joints; non-tender to palpation; no edema; normal capillary refill; no cyanosis    SKIN: Normal color for age and race; warm NEURO: Moves all extremities equally; patient has no slurred speech or aphasia, appears very drowsy, answers to verbal and physical stimuli but keeps eyes closed otherwise PSYCH: The patient's mood and manner are appropriate. Grooming and personal hygiene are appropriate.  MEDICAL DECISION MAKING: Patient here with altered mental status, she appears slightly obtunded but is protecting her airway. Hemodynamically stable. We'll obtain labs, urine, chest x-ray, head CT, ethanol level and urine drug screen, ABG, ammonia to evaluate for any organic cause for her symptoms today including electrolyte abnormality, infection, hypercarbia, intracranial hemorrhage. She has no focal neurologic deficits. I do not feel this is a stroke. She does have a fentanyl patch on. This has been removed. No change with Narcan.  ED PROGRESS: Patient has respiratory acidosis with PCO2 of 87. Her PCO2 is normally in the 40s. We'll start her on BiPAP.  Patient's labs show elevation of her LFTs. She does have a percutaneous  cholecystostomy tube in place for cholecystitis. It is draining bilious drainage. Urine shows leukocytes but also a few bacteria and squamous cells. Suspect dirty catch as this was obtained off of a bed pain. Otherwise her labs are unremarkable. Alcohol level and urine drug screen negative. Head CT shows no acute intracranial abnormality other than muscle acute sinusitis. Discussed with Dr. Algis Liming for admission to step down, telemetry. Patient appears to be more responsive with BiPAP. Still protecting her airway.     EKG Interpretation  Date/Time:  Friday January 15 2014 12:50:59 EDT Ventricular Rate:  84 PR Interval:  158 QRS Duration: 107 QT Interval:  385 QTC Calculation: 455 R Axis:   -53 Text Interpretation:  Sinus rhythm Atrial premature complexes Incomplete RBBB and LAFB Abnormal R-wave progression, early transition LVH with secondary repolarization abnormality Anterior Q waves, possibly due to LVH Confirmed by Malaika Arnall,  DO, Abriella Filkins ([id number]) on 01/15/2014 12:58:39 PM  CRITICAL CARE Performed by: Nyra Jabs   Total critical care time: 45 minutes  Critical care time was exclusive of separately billable procedures and treating other patients.  Critical care was necessary to treat or prevent imminent or life-threatening deterioration.  Critical care was time spent personally by me on the following activities: development of treatment plan with patient and/or surrogate as well as nursing, discussions with consultants, evaluation of patient's response to treatment, examination of patient, obtaining history from patient or surrogate, ordering and performing treatments and interventions, ordering and review of laboratory studies, ordering and review of radiographic studies, pulse oximetry and re-evaluation of patient's condition.   [place], DO 01/15/14 1515

## 2014-01-15 NOTE — Progress Notes (Signed)
ANTIBIOTIC CONSULT NOTE - INITIAL  Pharmacy Consult for Zosyn Indication: Intra-abdominal infection   No Known Allergies  Patient Measurements: Height: 5' 6.14" (168 cm) Weight: 127 lb 13.9 oz (58 kg) IBW/kg (Calculated) : 59.63 Adjusted Body Weight: n/a   Vital Signs: Temp: 99.5 F (37.5 C) (07/24 1309) Temp src: Rectal (07/24 1309) BP: 132/53 mmHg (07/24 1430) Pulse Rate: 86 (07/24 1458) Intake/Output from previous day:   Intake/Output from this shift:    Labs:  Recent Labs  01/15/14 1315 01/15/14 1333  WBC 7.4  --   HGB 11.4* 13.6  PLT 167  --   CREATININE 0.47* 0.70   Estimated Creatinine Clearance: 51.4 ml/min (by C-G formula based on Cr of 0.7). No results found for this basename: VANCOTROUGH, VANCOPEAK, VANCORANDOM, GENTTROUGH, GENTPEAK, GENTRANDOM, TOBRATROUGH, TOBRAPEAK, TOBRARND, AMIKACINPEAK, AMIKACINTROU, AMIKACIN,  in the last 72 hours   Microbiology: No results found for this or any previous visit (from the past 720 hour(s)).  Medical History: Past Medical History  Diagnosis Date  . Anxiety   . Smoking   . Fibromyalgia   . Depression   . Hyperlipidemia   . Hypercalcemia   . Adrenal adenoma     Stable as of 05/2012 CT scan  . Chronic respiratory failure   . Coronary artery disease   . GERD (gastroesophageal reflux disease)   . Lymphedema syndrome, postmastectomy     RUE  . Breast CA 1970    rt breast  . CHF (congestive heart failure)   . COPD (chronic obstructive pulmonary disease)   . On home oxygen therapy     "3L; 24/7" (11/11/2013)  . DJD (degenerative joint disease)   . Arthritis     "joints" (11/11/2013)  . Chronic back pain     "shoulders to lower back" (11/11/2013)  . Frequent UTI     Medications:   (Not in a hospital admission) Assessment: 56 YOF brought to the ED with lethargy and confusion. Labs in the ED showed elevated LFTs. She has a percutaneous cholecystostomy tube in place with bilious drainage. Pharmacy consulted  to start empiric antibiotics for intra-abdominal infection. WBC wnl. Pt is afebrile. CrCl ~ 51 mL/min.   Cultures: 7/24 Blood Cx x2>>   Goal of Therapy:  Resolution of infection   Plan:  -Start Zosyn 3.375 gm IV Q 8 hours  -Monitor CBC, rena fx, cultures and patient's clinical progress  Albertina Parr, PharmD.  Clinical Pharmacist Pager [phone]

## 2014-01-16 ENCOUNTER — Encounter (HOSPITAL_COMMUNITY): Payer: Self-pay

## 2014-01-16 LAB — BLOOD GAS, ARTERIAL
ACID-BASE EXCESS: 8 mmol/L — AB (ref 0.0–2.0)
ACID-BASE EXCESS: 7.5 mmol/L — AB (ref 0.0–2.0)
Bicarbonate: 34.2 mEq/L — ABNORMAL HIGH (ref 20.0–24.0)
Bicarbonate: 32.5 mEq/L — ABNORMAL HIGH (ref 20.0–24.0)
DELIVERY SYSTEMS: POSITIVE
DRAWN BY: 405301
Drawn by: 275531
Expiratory PAP: 6
FIO2: 0.4 %
INSPIRATORY PAP: 14
LHR: 16 {breaths}/min
O2 Content: 4 L/min
O2 SAT: 98.5 %
O2 SAT: 97.4 %
PATIENT TEMPERATURE: 98.6
Patient temperature: 98.6
TCO2: 36.3 mmol/L (ref 0–100)
TCO2: 34.2 mmol/L (ref 0–100)
pCO2 arterial: 70.3 mmHg (ref 35.0–45.0)
pCO2 arterial: 55.8 mmHg — ABNORMAL HIGH (ref 35.0–45.0)
pH, Arterial: 7.308 — ABNORMAL LOW (ref 7.350–7.450)
pH, Arterial: 7.384 (ref 7.350–7.450)
pO2, Arterial: 123 mmHg — ABNORMAL HIGH (ref 80.0–100.0)
pO2, Arterial: 80.2 mmHg (ref 80.0–100.0)

## 2014-01-16 LAB — COMPREHENSIVE METABOLIC PANEL
ALT: 343 U/L — AB (ref 0–35)
ANION GAP: 10 (ref 5–15)
AST: 323 U/L — ABNORMAL HIGH (ref 0–37)
Albumin: 3.5 g/dL (ref 3.5–5.2)
Alkaline Phosphatase: 134 U/L — ABNORMAL HIGH (ref 39–117)
BUN: 14 mg/dL (ref 6–23)
CO2: 31 meq/L (ref 19–32)
Calcium: 9.7 mg/dL (ref 8.4–10.5)
Chloride: 101 mEq/L (ref 96–112)
Creatinine, Ser: 0.47 mg/dL — ABNORMAL LOW (ref 0.50–1.10)
GLUCOSE: 134 mg/dL — AB (ref 70–99)
Potassium: 4.2 mEq/L (ref 3.7–5.3)
SODIUM: 142 meq/L (ref 137–147)
TOTAL PROTEIN: 6.4 g/dL (ref 6.0–8.3)
Total Bilirubin: 0.8 mg/dL (ref 0.3–1.2)

## 2014-01-16 LAB — CBC
HCT: 36 % (ref 36.0–46.0)
Hemoglobin: 10.8 g/dL — ABNORMAL LOW (ref 12.0–15.0)
MCH: 31.4 pg (ref 26.0–34.0)
MCHC: 30 g/dL (ref 30.0–36.0)
MCV: 104.7 fL — ABNORMAL HIGH (ref 78.0–100.0)
PLATELETS: 127 10*3/uL — AB (ref 150–400)
RBC: 3.44 MIL/uL — AB (ref 3.87–5.11)
RDW: 14.2 % (ref 11.5–15.5)
WBC: 3.4 10*3/uL — ABNORMAL LOW (ref 4.0–10.5)

## 2014-01-16 MED ORDER — PANTOPRAZOLE SODIUM 40 MG PO TBEC
40.0000 mg | DELAYED_RELEASE_TABLET | Freq: Every day | ORAL | Status: DC
  Administered 2014-01-16 – 2014-01-19 (×4): 40 mg via ORAL
  Filled 2014-01-16 (×2): qty 1

## 2014-01-16 MED ORDER — SERTRALINE HCL 50 MG PO TABS
50.0000 mg | ORAL_TABLET | Freq: Every day | ORAL | Status: DC
  Administered 2014-01-16 – 2014-01-19 (×4): 50 mg via ORAL
  Filled 2014-01-16 (×5): qty 1

## 2014-01-16 MED ORDER — ALBUTEROL SULFATE (2.5 MG/3ML) 0.083% IN NEBU
2.5000 mg | INHALATION_SOLUTION | Freq: Two times a day (BID) | RESPIRATORY_TRACT | Status: DC
  Administered 2014-01-17 (×2): 2.5 mg via RESPIRATORY_TRACT
  Filled 2014-01-16 (×2): qty 3

## 2014-01-16 MED ORDER — IPRATROPIUM BROMIDE 0.02 % IN SOLN
0.5000 mg | Freq: Two times a day (BID) | RESPIRATORY_TRACT | Status: DC
  Administered 2014-01-17 (×2): 0.5 mg via RESPIRATORY_TRACT
  Filled 2014-01-16 (×2): qty 2.5

## 2014-01-16 MED ORDER — PREGABALIN 25 MG PO CAPS
50.0000 mg | ORAL_CAPSULE | Freq: Two times a day (BID) | ORAL | Status: DC
  Administered 2014-01-16 – 2014-01-17 (×4): 50 mg via ORAL
  Filled 2014-01-16 (×4): qty 2

## 2014-01-16 NOTE — Progress Notes (Signed)
Subjective: Confused, comfortable off BIPAP  Objective: Vital signs in last 24 hours: Temp:  [97.5 F (36.4 C)-99.5 F (37.5 C)] 98.6 F (37 C) (07/25 0702) Pulse Rate:  [58-88] 87 (07/25 0702) Resp:  [10-81] 81 (07/25 0702) BP: (104-135)/(45-95) 114/47 mmHg (07/25 0702) SpO2:  [90 %-100 %] 94 % (07/25 0806) FiO2 (%):  [40 %-50 %] 40 % (07/25 0702) Weight:  [58 kg (127 lb 13.9 oz)-59 kg (130 lb 1.1 oz)] 59 kg (130 lb 1.1 oz) (07/25 0300) Weight change:     Intake/Output from previous day: 07/24 0701 - 07/25 0700 In: 997.5 [I.V.:997.5] Out: -  Intake/Output this shift:    General appearance: alert and cooperative Resp: clear to auscultation bilaterally Cardio: regular rate and rhythm, S1, S2 normal, no murmur, click, rub or gallop GI: soft, non-tender; bowel sounds normal; no masses,  no organomegaly Extremities: extremities normal, atraumatic, no cyanosis or edema  Lab Results:  Recent Labs  01/15/14 1315 01/15/14 1333 01/16/14 0309  WBC 7.4  --  3.4*  HGB 11.4* 13.6 10.8*  HCT 38.3 40.0 36.0  PLT 167  --  127*   BMET  Recent Labs  01/15/14 1315 01/15/14 1333 01/16/14 0309  NA 143 139 142  K 4.3 4.1 4.2  CL 99 95* 101  CO2 36*  --  31  GLUCOSE 108* 113* 134*  BUN 14 15 14   CREATININE 0.47* 0.70 0.47*  CALCIUM 9.9  --  9.7    Studies/Results: Ct Head Wo Contrast  01/15/2014   CLINICAL DATA:  Altered mental status. ; history of CHF, breast malignancy, and COPD  EXAM: CT HEAD WITHOUT CONTRAST  TECHNIQUE: Contiguous axial images were obtained from the base of the skull through the vertex without intravenous contrast.  COMPARISON:  Sinus CT scan dated May 31, 2003  FINDINGS: There is mild age appropriate diffuse cerebral atrophy with compensatory ventriculomegaly. There is symmetric decreased density in the deep white matter of both cerebral hemispheres consistent with chronic small vessel ischemia. There is no acute intracranial hemorrhage nor evidence of  an acute ischemic event. The cerebellum and brainstem are normal.  There is fluid in the right maxillary sinus. The observed bony margins of the sinus are normal. Fluid was present bilaterally in the maxillary sinuses on the previous CT scan. The other paranasal sinuses are clear. The mastoid air cells are well pneumatized. There is no skull fracture.  IMPRESSION: 1. There is no acute ischemic or hemorrhagic event. 2. There are age related changes of chronic small vessel ischemia and acute atrophy. 3. Fluid in the right maxillary sinus in the absence of facial trauma is consistent with acute sinusitis. This may impact the patient's mental status. 4. There is no acute skull fracture.   Electronically Signed   By: David  Martinique   On: 01/15/2014 14:30   Dg Chest Port 1 View  01/15/2014   CLINICAL DATA:  Acute mental status changes.  Long-time smoker.  EXAM: PORTABLE CHEST - 1 VIEW  COMPARISON:  Two-view chest x-ray 11/11/2013, 02/02/2012, 05/30/2005. Portable chest x-ray 03/05/2012. CTA chest 06/05/2011.  FINDINGS: Patient rotated to the right. Cardiac silhouette mildly enlarged but stable. Thoracic aorta tortuous and atherosclerotic, unchanged. Right apical pleuroparenchymal scarring, unchanged when accounting for differences in technique. Lungs otherwise clear. No pneumothorax. Pulmonary vascularity normal. Costophrenic angles excluded from the image.  IMPRESSION: No acute cardiopulmonary disease. Stable right apical pleuroparenchymal scarring.   Electronically Signed   By: Evangeline Dakin M.D.   On: 01/15/2014  13:55    Medications: I have reviewed the patient's current medications.  Assessment/Plan: 1. Acute encephalopathy: Likely secondary to acute on chronic hypercapnic and hypoxic respiratory failure. No evidence of other etiology.  Less lethargic, but still some confusion 2. Acute on chronic hypercapnic and hypoxic respiratory failure/severe COPD: Unclear precipitating cause for acute decline. She is  on relatively high dose of fentanyl, could cause respiratory depression in setting of acute liver injury.  Hold narcotics. Continue supportive treatment with oxygen, BiPAP prn, IV Solu-Medrol, bronchodilator nebulizations. Currently maintaining airway and hemodynamically stable. 3. Abnormal LFTs/history of acute cholecystitis status post cholecystostomy tube: Not candidate for surgery. No symptoms and signs suggestive of acute infection.Cholecystostomy seems to be draining well. Possible role of acetominophen but level normal. Episode hypotension?  Follow lfts, starting to trend down 4. Tobacco abuse: Patient apparently has been counseled repeatedly in the past but continues to smoke. 5. GERD:  PPI 6. History of chronic diastolic CHF: Seems slightly dehydrated. Hold Lasix. Brief IV fluids. 7. Mild dehydration: IV fluids 8. History of cancer, status post right mastectomy: Chronic right upper extremity edema- has compressive sleeve 9. Acute right maxillary sinusitis on CT: Zosyn should cover.  Code Status: Full code  Family Communication: Discussed with patient's son/HCPOA Mr. Saphyre Cillo   Disposition Plan: Admit to step down unit.     LOS: 1 day   Theresa Blackwell JOSEPH 01/16/2014, 10:39 AM

## 2014-01-16 NOTE — Progress Notes (Signed)
RT called with critical blood gas results, on-call Callihan was notified and he said since lab result continues to improve,  we will just keep monitoring pt. Will continue to monitor pt.----Emelyn Roen, rn

## 2014-01-16 NOTE — Progress Notes (Signed)
Nutrition Brief Note  Patient identified on the Malnutrition Screening Tool (MST) Report  Wt Readings from Last 15 Encounters:  01/16/14 130 lb 1.1 oz (59 kg)  01/04/14 127 lb 12.8 oz (57.97 kg)  12/14/13 133 lb (60.328 kg)  11/15/13 136 lb 14.4 oz (62.096 kg)  04/03/12 175 lb (79.379 kg)  04/03/12 175 lb (79.379 kg)  03/10/12 156 lb 1.4 oz (70.8 kg)  03/04/12 153 lb (69.4 kg)  02/05/12 167 lb 8.8 oz (76 kg)  06/10/11 179 lb 3.2 oz (81.285 kg)    Body mass index is 20.9 kg/(m^2). Patient meets criteria for Normal Weight based on current BMI. MST score was filed inaccurately. Pt states her appetite has been great, she has been maintaing her weight, and eating well. Pt's son at bedside confirms pt has been eating very well, no use of nutritional supplements.   Current diet order is Regular, diet was just advanced this AM pt has not yet had a chance to eat a meal since admission.  Labs and medications reviewed.   No nutrition interventions warranted at this time. If nutrition issues arise, please consult RD.   Pryor Ochoa RD, LDN Inpatient Clinical Dietitian Pager: [phone] After Hours Pager: [phone]

## 2014-01-16 NOTE — Progress Notes (Signed)
RT called with a panic blood gas value, on -call Callihan was notified, who said its improving from the previous results and will order another blood gas in the am. Will continue to monitor pt----Simonne Boulos, rn

## 2014-01-17 LAB — COMPREHENSIVE METABOLIC PANEL
ALBUMIN: 3 g/dL — AB (ref 3.5–5.2)
ALT: 167 U/L — ABNORMAL HIGH (ref 0–35)
ANION GAP: 10 (ref 5–15)
AST: 87 U/L — ABNORMAL HIGH (ref 0–37)
Alkaline Phosphatase: 88 U/L (ref 39–117)
BILIRUBIN TOTAL: 0.6 mg/dL (ref 0.3–1.2)
BUN: 20 mg/dL (ref 6–23)
CHLORIDE: 100 meq/L (ref 96–112)
CO2: 31 mEq/L (ref 19–32)
Calcium: 9.7 mg/dL (ref 8.4–10.5)
Creatinine, Ser: 0.49 mg/dL — ABNORMAL LOW (ref 0.50–1.10)
GFR calc non Af Amer: 90 mL/min — ABNORMAL LOW (ref >90)
GLUCOSE: 91 mg/dL (ref 70–99)
Potassium: 4.3 mEq/L (ref 3.7–5.3)
SODIUM: 141 meq/L (ref 137–147)
Total Protein: 5.7 g/dL — ABNORMAL LOW (ref 6.0–8.3)

## 2014-01-17 LAB — CBC
HEMATOCRIT: 30.3 % — AB (ref 36.0–46.0)
HEMOGLOBIN: 9.3 g/dL — AB (ref 12.0–15.0)
MCH: 31.3 pg (ref 26.0–34.0)
MCHC: 30.7 g/dL (ref 30.0–36.0)
MCV: 102 fL — ABNORMAL HIGH (ref 78.0–100.0)
Platelets: 126 10*3/uL — ABNORMAL LOW (ref 150–400)
RBC: 2.97 MIL/uL — ABNORMAL LOW (ref 3.87–5.11)
RDW: 14.8 % (ref 11.5–15.5)
WBC: 5.1 10*3/uL (ref 4.0–10.5)

## 2014-01-17 MED ORDER — AMOXICILLIN-POT CLAVULANATE 875-125 MG PO TABS
1.0000 | ORAL_TABLET | Freq: Two times a day (BID) | ORAL | Status: DC
  Administered 2014-01-17 – 2014-01-19 (×6): 1 via ORAL
  Filled 2014-01-17 (×8): qty 1

## 2014-01-17 MED ORDER — TRAMADOL HCL 50 MG PO TABS
50.0000 mg | ORAL_TABLET | Freq: Four times a day (QID) | ORAL | Status: DC | PRN
  Administered 2014-01-17 – 2014-01-19 (×4): 50 mg via ORAL
  Filled 2014-01-17 (×5): qty 1

## 2014-01-17 MED ORDER — VERAPAMIL HCL ER 120 MG PO TBCR
120.0000 mg | EXTENDED_RELEASE_TABLET | Freq: Once | ORAL | Status: AC
  Administered 2014-01-17: 120 mg via ORAL
  Filled 2014-01-17: qty 1

## 2014-01-17 MED ORDER — PREDNISONE 20 MG PO TABS
40.0000 mg | ORAL_TABLET | Freq: Every day | ORAL | Status: DC
  Administered 2014-01-18: 40 mg via ORAL
  Filled 2014-01-17 (×3): qty 2

## 2014-01-17 NOTE — Progress Notes (Signed)
At 1030 patient O2 was decreased to 1.5 Liters via nasal cannula. Pt tolerated well sating between 94-100%.  Pt was able to keep sats up while ambulating to the bedside commode and sitting in the chair.  At 1515 pt was taken completely off oxygen and was sating between 90-96% on room air.   At 1800 pt sats dropped to 88% RA. Pt placed back on 1L via nasal cannula.  Will continue to monitor.

## 2014-01-17 NOTE — Progress Notes (Signed)
Subjective: Feeling better, mental status cleared.  Some neck pain and back of head  Objective: Vital signs in last 24 hours: Temp:  [97.9 F (36.6 C)-98.3 F (36.8 C)] 98 F (36.7 C) (07/26 0734) Pulse Rate:  [64-87] 78 (07/26 0704) Resp:  [15-25] 16 (07/26 0704) BP: (93-129)/(41-96) 129/52 mmHg (07/26 0704) SpO2:  [97 %-100 %] 97 % (07/26 0837) FiO2 (%):  [40 %] 40 % (07/25 1210) Weight change:     Intake/Output from previous day: 07/25 0701 - 07/26 0700 In: 300 [P.O.:300] Out: 200 [Urine:200] Intake/Output this shift: Total I/O In: 240 [P.O.:240] Out: 150 [Urine:150]  General appearance: alert and cooperative Resp: clear to auscultation bilaterally Cardio: regular rate and rhythm, S1, S2 normal, no murmur, click, rub or gallop GI: soft, non-tender; bowel sounds normal; no masses,  no organomegaly Extremities: extremities normal, atraumatic, no cyanosis or edema  Lab Results:  Recent Labs  01/16/14 0309 01/17/14 0322  WBC 3.4* 5.1  HGB 10.8* 9.3*  HCT 36.0 30.3*  PLT 127* 126*   BMET  Recent Labs  01/16/14 0309 01/17/14 0322  NA 142 141  K 4.2 4.3  CL 101 100  CO2 31 31  GLUCOSE 134* 91  BUN 14 20  CREATININE 0.47* 0.49*  CALCIUM 9.7 9.7    Studies/Results: Ct Head Wo Contrast  01/15/2014   CLINICAL DATA:  Altered mental status. ; history of CHF, breast malignancy, and COPD  EXAM: CT HEAD WITHOUT CONTRAST  TECHNIQUE: Contiguous axial images were obtained from the base of the skull through the vertex without intravenous contrast.  COMPARISON:  Sinus CT scan dated May 31, 2003  FINDINGS: There is mild age appropriate diffuse cerebral atrophy with compensatory ventriculomegaly. There is symmetric decreased density in the deep white matter of both cerebral hemispheres consistent with chronic small vessel ischemia. There is no acute intracranial hemorrhage nor evidence of an acute ischemic event. The cerebellum and brainstem are normal.  There is fluid in  the right maxillary sinus. The observed bony margins of the sinus are normal. Fluid was present bilaterally in the maxillary sinuses on the previous CT scan. The other paranasal sinuses are clear. The mastoid air cells are well pneumatized. There is no skull fracture.  IMPRESSION: 1. There is no acute ischemic or hemorrhagic event. 2. There are age related changes of chronic small vessel ischemia and acute atrophy. 3. Fluid in the right maxillary sinus in the absence of facial trauma is consistent with acute sinusitis. This may impact the patient's mental status. 4. There is no acute skull fracture.   Electronically Signed   By: David  Martinique   On: 01/15/2014 14:30   Dg Chest Port 1 View  01/15/2014   CLINICAL DATA:  Acute mental status changes.  Long-time smoker.  EXAM: PORTABLE CHEST - 1 VIEW  COMPARISON:  Two-view chest x-ray 11/11/2013, 02/02/2012, 05/30/2005. Portable chest x-ray 03/05/2012. CTA chest 06/05/2011.  FINDINGS: Patient rotated to the right. Cardiac silhouette mildly enlarged but stable. Thoracic aorta tortuous and atherosclerotic, unchanged. Right apical pleuroparenchymal scarring, unchanged when accounting for differences in technique. Lungs otherwise clear. No pneumothorax. Pulmonary vascularity normal. Costophrenic angles excluded from the image.  IMPRESSION: No acute cardiopulmonary disease. Stable right apical pleuroparenchymal scarring.   Electronically Signed   By: Evangeline Dakin M.D.   On: 01/15/2014 13:55    Medications: I have reviewed the patient's current medications.  Assessment/Plan: 1. Acute encephalopathy: Likely secondary to acute on chronic hypercapnic and hypoxic respiratory failure. No evidence of  other etiology.Back to baseline today. 2. Acute on chronic hypercapnic and hypoxic respiratory failure/severe COPD: Unclear precipitating cause for acute decline. She is on relatively high dose of fentanyl, could cause respiratory depression in setting of acute liver  injury. Hold narcotics. Continue supportive treatment with oxygen, BiPAP prn, change to po prednisone and quick taper, bronchodilator nebulizations. Recheck blood gas. 3. Abnormal LFTs/history of acute cholecystitis status post cholecystostomy tube: Not candidate for surgery. No symptoms and signs suggestive of acute infection.Cholecystostomy seems to be draining well. Possible role of acetominophen but level normal. Episode hypotension? Lfts much better today 4. Tobacco abuse: Patient apparently has been counseled repeatedly in the past but continues to smoke. 5. GERD: PPI 6. History of chronic diastolic CHF: Seemed slightly dehydrated. Holding Lasix. IVFs discontinued 7. History of cancer, status post right mastectomy: Chronic right upper extremity edema- has compressive sleeve 8. Acute right maxillary sinusitis on CT: change to augmentin 9. Headache, tramadol prn   Code Status: Full code  Family Communication: Discussed with patient's son yesterday/HCPOA Mr. Weslee Prestage  Disposition Plan: Hope to transfer to floor tomorrow.    LOS: 2 days   Jinelle Butchko JOSEPH 01/17/2014, 10:58 AM

## 2014-01-18 LAB — POCT I-STAT 3, ART BLOOD GAS (G3+)
Acid-Base Excess: 9 mmol/L — ABNORMAL HIGH (ref 0.0–2.0)
BICARBONATE: 34.2 meq/L — AB (ref 20.0–24.0)
O2 SAT: 95 %
PCO2 ART: 46.7 mmHg — AB (ref 35.0–45.0)
PH ART: 7.472 — AB (ref 7.350–7.450)
PO2 ART: 74 mmHg — AB (ref 80.0–100.0)
Patient temperature: 98.6
TCO2: 36 mmol/L (ref 0–100)

## 2014-01-18 LAB — COMPREHENSIVE METABOLIC PANEL
ALT: 96 U/L — ABNORMAL HIGH (ref 0–35)
ANION GAP: 10 (ref 5–15)
AST: 31 U/L (ref 0–37)
Albumin: 2.9 g/dL — ABNORMAL LOW (ref 3.5–5.2)
Alkaline Phosphatase: 77 U/L (ref 39–117)
BUN: 13 mg/dL (ref 6–23)
CALCIUM: 9.2 mg/dL (ref 8.4–10.5)
CO2: 30 mEq/L (ref 19–32)
Chloride: 98 mEq/L (ref 96–112)
Creatinine, Ser: 0.41 mg/dL — ABNORMAL LOW (ref 0.50–1.10)
GFR calc non Af Amer: >90 mL/min (ref >90)
GLUCOSE: 87 mg/dL (ref 70–99)
POTASSIUM: 4 meq/L (ref 3.7–5.3)
Sodium: 138 mEq/L (ref 137–147)
TOTAL PROTEIN: 5.4 g/dL — AB (ref 6.0–8.3)
Total Bilirubin: 0.5 mg/dL (ref 0.3–1.2)

## 2014-01-18 LAB — CBC
HEMATOCRIT: 30.1 % — AB (ref 36.0–46.0)
HEMOGLOBIN: 9.5 g/dL — AB (ref 12.0–15.0)
MCH: 31 pg (ref 26.0–34.0)
MCHC: 31.6 g/dL (ref 30.0–36.0)
MCV: 98.4 fL (ref 78.0–100.0)
Platelets: 122 10*3/uL — ABNORMAL LOW (ref 150–400)
RBC: 3.06 MIL/uL — AB (ref 3.87–5.11)
RDW: 14.6 % (ref 11.5–15.5)
WBC: 5.2 10*3/uL (ref 4.0–10.5)

## 2014-01-18 MED ORDER — TIOTROPIUM BROMIDE MONOHYDRATE 18 MCG IN CAPS
18.0000 ug | ORAL_CAPSULE | Freq: Every day | RESPIRATORY_TRACT | Status: DC
  Administered 2014-01-18: 18 ug via RESPIRATORY_TRACT
  Filled 2014-01-18 (×2): qty 5

## 2014-01-18 MED ORDER — ALBUTEROL SULFATE (2.5 MG/3ML) 0.083% IN NEBU: 2.5000 mg | INHALATION_SOLUTION | RESPIRATORY_TRACT | Status: DC | PRN

## 2014-01-18 MED ORDER — PREGABALIN 75 MG PO CAPS
75.0000 mg | ORAL_CAPSULE | Freq: Two times a day (BID) | ORAL | Status: DC
  Administered 2014-01-18 – 2014-01-19 (×4): 75 mg via ORAL
  Filled 2014-01-18 (×4): qty 1

## 2014-01-18 MED ORDER — VERAPAMIL HCL ER 120 MG PO TBCR
120.0000 mg | EXTENDED_RELEASE_TABLET | Freq: Every day | ORAL | Status: DC
  Administered 2014-01-18 – 2014-01-19 (×2): 120 mg via ORAL
  Filled 2014-01-18 (×3): qty 1

## 2014-01-18 NOTE — Progress Notes (Signed)
Transfer note:  Arrival Method: Wheelchair transported by , Jan,RN  Mental Orientation:A&OX2, confused Telemetry:  TX05 Assessment: See doc flowsheets Skin: Dry, intact IV: L forearm Pain: Denies pain Tubes: Biliary drain Safety Measures: Fall risk assessment. Fall Prevention Safety Plan:  High fall risk interventions in place;Bed alarm, etc. Admission Screening: to be comp 6700 Orientation: Patient has been oriented to the unit, staff and to the room.

## 2014-01-18 NOTE — Progress Notes (Signed)
Subjective: Feeling much better  Objective: Vital signs in last 24 hours: Temp:  [98 F (36.7 C)-98.8 F (37.1 C)] 98 F (36.7 C) (07/27 0401) Pulse Rate:  [70-84] 84 (07/27 0401) Resp:  [16-27] 21 (07/27 0401) BP: (98-129)/(43-78) 98/78 mmHg (07/27 0401) SpO2:  [91 %-98 %] 96 % (07/27 0401) Weight:  [61.5 kg (135 lb 9.3 oz)] 61.5 kg (135 lb 9.3 oz) (07/27 0401) Weight change:     Intake/Output from previous day: 07/26 0701 - 07/27 0700 In: 600 [P.O.:600] Out: 751 [Urine:750; Stool:1] Intake/Output this shift: Total I/O In: -  Out: 250 [Urine:250]  General appearance: alert and cooperative Resp: clear to auscultation bilaterally Cardio: regular rate and rhythm, S1, S2 normal, no murmur, click, rub or gallop GI: soft, non-tender; bowel sounds normal; no masses,  no organomegaly Extremities: extremities normal, atraumatic, no cyanosis or edema  Lab Results:  Recent Labs  01/16/14 0309 01/17/14 0322  WBC 3.4* 5.1  HGB 10.8* 9.3*  HCT 36.0 30.3*  PLT 127* 126*   BMET  Recent Labs  01/16/14 0309 01/17/14 0322  NA 142 141  K 4.2 4.3  CL 101 100  CO2 31 31  GLUCOSE 134* 91  BUN 14 20  CREATININE 0.47* 0.49*  CALCIUM 9.7 9.7    Studies/Results: No results found.  Medications: I have reviewed the patient's current medications.  Assessment/Plan: 1. Acute encephalopathy: Likely secondary to acute on chronic hypercapnic and hypoxic respiratory failure. REsolved 2. Acute on chronic hypercapnic and hypoxic respiratory failure/severe COPD: Unclear precipitating cause for acute decline. She is on relatively high dose of fentanyl, could cause respiratory depression in setting of acute liver injury. Hold narcotics. ABG much improved, oxygenating well on 0-1 liter.  3. Abnormal LFTs/history of acute cholecystitis status post cholecystostomy tube: Not candidate for surgery. No symptoms and signs suggestive of acute infection.Cholecystostomy seems to be draining well.  Possible role of acetominophen but level normal. Episode hypotension? Lfts much better, pending today 4. Atrial Fibrillation, episode a fib with RVR yesterday, brief, now in NSR, no further workup.  Back on verapamil 5. Tobacco abuse: Patient apparently has been counseled repeatedly in the past but continues to smoke. 6. GERD: PPI 7. History of chronic diastolic CHF: Seemed slightly dehydrated. Holding Lasix. IVFs discontinued 8. History of cancer, status post right mastectomy: Chronic right upper extremity edema- has compressive sleeve 9. Acute right maxillary sinusitis on CT: changed to augmentin 10. Headache, tramadol prn   Code Status: Full code  Family Communication: Discussed with patient's son yesterday/HCPOA Mr. Theresa Blackwell  Disposition Plan: Transfer to telemetry     LOS: 3 days   Theresa Blackwell JOSEPH 01/18/2014, 6:02 AM

## 2014-01-18 NOTE — Evaluation (Signed)
Physical Therapy Evaluation and Discharge Patient Details Name: Theresa Blackwell MRN: [id number] DOB: 25-Aug-1930 Today's Date: 01/18/2014   History of Present Illness  Adm 01/15/14 with AMS and found to be hypoxic and placed on BiPAP. PMHx- COPD with home O2 @ 3L, CHF, CAD, Rt mastectomy with RUE lymphedema, back surgery   Clinical Impression  Patient's friend/neighbor of 40 years present during evaluation and reports she is at her baseline for mobility, however is not yet back to her cognitive baseline. Evidence of confusion during PT session was present. Pt was recently discharged from a SNF to home with family's care. Recommend home with 24/7 care (primarily supervision due to mental status). No further acute PT needs identified. Thank you for this consult.    Follow Up Recommendations No PT follow up;Supervision/Assistance - 24 hour    Equipment Recommendations  None recommended by PT    Recommendations for Other Services       Precautions / Restrictions Precautions Precautions: Fall Precaution Comments: remains confused      Mobility  Bed Mobility Overal bed mobility: Needs Assistance Bed Mobility: Supine to Sit;Sit to Supine     Supine to sit: Supervision Sit to supine: Supervision   General bed mobility comments: supervision for safety due to AMS; no physical assist  Transfers Overall transfer level: Needs assistance Equipment used: Rolling walker (2 wheeled) Transfers: Sit to/from Stand Sit to Stand: Min guard         General transfer comment: close guarding for safety/potential loss of balance, however pt was steady coming to her feet  Ambulation/Gait Ambulation/Gait assistance: Min guard Ambulation Distance (Feet): 60 Feet Assistive device: Rolling walker (2 wheeled) Gait Pattern/deviations: Step-through pattern;Decreased stride length;Trunk flexed Gait velocity: decr   General Gait Details: pt able to maneuver RW around obstacles in her room and hallway  without assistance; neighbor of 40 yrs present and reports she is at her baseline with mobility  [employer] trader [employer] (Stroke Patients Only)       Balance Overall balance assessment: Needs assistance Sitting-balance support: No upper extremity supported;Feet supported Sitting balance-Leahy Scale: Good Sitting balance - Comments: reaching outside base of support   Standing balance support: No upper extremity supported Standing balance-Leahy Scale: Good Standing balance comment: reaching outside base of support up to 8 inches             High level balance activites: Direction changes;Turns;Head turns (with RW)               Pertinent Vitals/Pain On 2L O2 throughout. SaO2 at rest 96% and with gait 92% Pt able to talk while walking    Home Living Family/patient expects to be discharged to:: Private residence Living Arrangements: Spouse/significant other;Children (son) Available Help at Discharge: Family;Neighbor;Available 24 hours/day (per neighbor at bedside)           Home Equipment: Gilford Rile - 2 wheels;Cane - single point      Prior Function Level of Independence: Needs assistance   Gait / Transfers Assistance Needed: walking with cane (mostly) vs RW           Hand Dominance        Extremity/Trunk Assessment   Upper Extremity Assessment: RUE deficits/detail RUE Deficits / Details: lymphedema with compression garment in place         Lower Extremity Assessment: Overall WFL for tasks assessed (denies numbness)      Cervical /  Trunk Assessment: Kyphotic  Communication   Communication: No difficulties  Cognition Arousal/Alertness: Lethargic (sleepy) Behavior During Therapy:  (frustrated) Overall Cognitive Status: Impaired/Different from baseline Area of Impairment: Orientation;Attention;Memory;Safety/judgement;Awareness Orientation Level: Time;Situation Current Attention Level: Sustained Memory:  Decreased short-term memory   Safety/Judgement: Decreased awareness of safety (has sitter for safety) Awareness:  (not yet intellectual level)   General Comments: pt easily distracted    General Comments General comments (skin integrity, edema, etc.): Pt mildly confused "I think I have my days and nights mixed up" "Are those my pants or yours?"(when her second gown was removed from her back prior to lying down    Exercises        Assessment/Plan    PT Assessment Patent does not need any further PT services  PT Diagnosis     PT Problem List    PT Treatment Interventions     PT Goals (Current goals can be found in the Care Plan section) Acute Rehab PT Goals PT Goal Formulation: No goals set, d/c therapy    Frequency     Barriers to discharge        Co-evaluation               End of Session Equipment Utilized During Treatment: Gait belt;Oxygen Activity Tolerance: Patient tolerated treatment well Patient left: in bed;with call bell/phone within reach;with bed alarm set;with family/visitor present Nurse Communication: Mobility status;Other (comment) (SaO2 levels; sitter stepped out during session, not back yet)         Time: 8413-2440 PT Time Calculation (min): 21 min   Charges:   PT Evaluation $Initial PT Evaluation Tier I: 1 Procedure PT Treatments $Gait Training: 8-22 mins   PT G Codes:          Theresa Blackwell 01/28/2014, 2:39 PM Pager [phone]

## 2014-01-19 LAB — CBC
HEMATOCRIT: 30.4 % — AB (ref 36.0–46.0)
HEMOGLOBIN: 9.8 g/dL — AB (ref 12.0–15.0)
MCH: 31.1 pg (ref 26.0–34.0)
MCHC: 32.2 g/dL (ref 30.0–36.0)
MCV: 96.5 fL (ref 78.0–100.0)
Platelets: 128 10*3/uL — ABNORMAL LOW (ref 150–400)
RBC: 3.15 MIL/uL — ABNORMAL LOW (ref 3.87–5.11)
RDW: 14.3 % (ref 11.5–15.5)
WBC: 6.5 10*3/uL (ref 4.0–10.5)

## 2014-01-19 LAB — COMPREHENSIVE METABOLIC PANEL
ALK PHOS: 72 U/L (ref 39–117)
ALT: 68 U/L — ABNORMAL HIGH (ref 0–35)
ANION GAP: 9 (ref 5–15)
AST: 21 U/L (ref 0–37)
Albumin: 2.8 g/dL — ABNORMAL LOW (ref 3.5–5.2)
BILIRUBIN TOTAL: 0.6 mg/dL (ref 0.3–1.2)
BUN: 11 mg/dL (ref 6–23)
CHLORIDE: 98 meq/L (ref 96–112)
CO2: 29 mEq/L (ref 19–32)
Calcium: 9.5 mg/dL (ref 8.4–10.5)
Creatinine, Ser: 0.43 mg/dL — ABNORMAL LOW (ref 0.50–1.10)
GFR calc non Af Amer: >90 mL/min (ref >90)
GLUCOSE: 89 mg/dL (ref 70–99)
POTASSIUM: 3.9 meq/L (ref 3.7–5.3)
Sodium: 136 mEq/L — ABNORMAL LOW (ref 137–147)
TOTAL PROTEIN: 5.2 g/dL — AB (ref 6.0–8.3)

## 2014-01-19 LAB — TSH: TSH: 1.15 u[IU]/mL (ref 0.350–4.500)

## 2014-01-19 MED ORDER — ACETAMINOPHEN 325 MG PO TABS
650.0000 mg | ORAL_TABLET | Freq: Four times a day (QID) | ORAL | Status: DC | PRN
  Administered 2014-01-19 – 2014-01-20 (×2): 650 mg via ORAL
  Filled 2014-01-19 (×2): qty 2

## 2014-01-19 NOTE — Progress Notes (Signed)
CARE MANAGEMENT NOTE 01/19/2014  Patient:  Theresa Blackwell, Theresa Blackwell   Account Number:  [id number]  Date Initiated:  01/18/2014  Documentation initiated by:  [facility]  Subjective/Objective Assessment:   Acute encephalopathy, hypoxic respiratory failure/severe COPD     Action/Plan:   active with [place] Go, waiting dc recommendations   Anticipated DC Date:     Anticipated DC Plan:  [place]  CM consult      Choice offered to / List presented to:             Status of service:  In process, will continue to follow Medicare Important Message given?  YES (If response is "NO", the following Medicare IM given date fields will be blank) Date Medicare IM given:  01/18/2014 Medicare IM given by:  [facility] Date Additional Medicare IM given:   Additional Medicare IM given by:    Discharge Disposition:    Per UR Regulation:    If discussed at Long Length of Stay Meetings, dates discussed:    Comments:

## 2014-01-19 NOTE — Progress Notes (Signed)
Subjective: Headache today  Objective: Vital signs in last 24 hours: Temp:  [97.6 F (36.4 C)-98.5 F (36.9 C)] 98.5 F (36.9 C) (07/28 0549) Pulse Rate:  [62-83] 75 (07/28 0549) Resp:  [14-18] 18 (07/28 0549) BP: (120-144)/(53-82) 130/61 mmHg (07/28 0549) SpO2:  [59 %-98 %] 91 % (07/28 0549) Weight:  [61.499 kg (135 lb 9.3 oz)] 61.499 kg (135 lb 9.3 oz) (07/27 2026) Weight change: -0.001 kg (-0 oz)    Intake/Output from previous day: 07/27 0701 - 07/28 0700 In: 840 [P.O.:840] Out: 200 [Urine:200] Intake/Output this shift:    General appearance: alert and cooperative Resp: clear to auscultation bilaterally Heart RRR GI: soft, non-tender; bowel sounds normal; no masses,  no organomegaly Extremities: extremities normal, atraumatic, no cyanosis or edema  Lab Results:  Recent Labs  01/18/14 0525 01/19/14 0358  WBC 5.2 6.5  HGB 9.5* 9.8*  HCT 30.1* 30.4*  PLT 122* 128*   BMET  Recent Labs  01/18/14 0525 01/19/14 0358  NA 138 136*  K 4.0 3.9  CL 98 98  CO2 30 29  GLUCOSE 87 89  BUN 13 11  CREATININE 0.41* 0.43*  CALCIUM 9.2 9.5    Studies/Results: No results found.  Medications: I have reviewed the patient's current medications.  Assessment/Plan: 1. Acute encephalopathy: Likely secondary to acute on chronic hypercapnic and hypoxic respiratory failure. REsolved 2. Acute on chronic hypercapnic and hypoxic respiratory failure/severe COPD: Continue to Hold narcotics. ABG much improved, oxygenating well on 0-1 liter.  3. Abnormal LFTs/history of acute cholecystitis status post cholecystostomy tube: Not candidate for surgery. No symptoms and signs suggestive of acute infection.Cholecystostomy seems to be draining well. Possible role of acetominophen but level normal. Episode hypotension? Lfts abnormalitiie resolved  4. Atrial Fibrillation, episode a fib with RVR Sunday, brief, now in NSR, no further workup. Back on verapamil 5. Tobacco abuse: Patient apparently  has been counseled repeatedly in the past but continues to smoke. 6. GERD: PPI 7. History of chronic diastolic CHF: Seemed slightly dehydrated. Holding Lasix. IVFs discontinued 8. History of cancer, status post right mastectomy: Chronic right upper extremity edema- has compressive sleeve 9. Acute right maxillary sinusitis on CT: changed to augmentin 10. Headache, tylenol prn   Code Status: Full code  Family Communication: Discussed with patient's friend today/HCPOA Mr. Bindi Klomp  Disposition Plan: hope to discharge tomorrow with home health, had home health prior to admission, I think she would benefit from some PT.    LOS: 4 days   [facility] 01/19/2014, 7:47 AM

## 2014-01-19 NOTE — Plan of Care (Signed)
Problem: Phase I Progression Outcomes Goal: OOB as tolerated unless otherwise ordered Outcome: Completed/Met Date Met:  01/19/14 Pt ambulatory in room to bathroom, declined to ambulate in hall d/t fatigue. Will continue to monitor

## 2014-01-20 MED ORDER — AMOXICILLIN-POT CLAVULANATE 875-125 MG PO TABS: 1.0000 | ORAL_TABLET | Freq: Two times a day (BID) | ORAL | Status: DC

## 2014-01-20 NOTE — Discharge Summary (Signed)
Physician Discharge Summary  Patient ID: Theresa Blackwell MRN: [id number] DOB/AGE: 07/11/1930 80 y.o.  Admit date: 01/15/2014 Discharge date: 01/20/2014  Admission Diagnoses: Acute on chronic respiratory failure with hypercapnia COPD Tobacco use Chronic diastolic heart failure Acute encephalopathy Elevated liver function tests Depression/anxiety/insomnia Fibromyalgia Hyperlipidemia Coronary artery disease Breast cancer Gastroesophageal reflux disease DJD  Discharge Diagnoses:  Principal Problem:   Acute on chronic respiratory failure with hypercapnia Active Problems: COPD   Tobacco abuse   Chronic diastolic heart failure   Acute encephalopathy, resolved   Abnormal LFTs Depression/anxiety since insomnia Fibromyalgia Hyperlipidemia Coronary artery disease Breast cancer Gastroesophageal reflux disease DJD    Discharged Condition: good  Hospital Course: The patient was admitted on July 24. History was obtained from her son. The morning of admission the patient refused to get out of bed and was lethargic and sleepy and confused. The patient had been taking extra strength Tylenol, exact amount was not known. He later was determined she also placed a fentanyl patch the day before, she does not wear this every day. In the emergency room arterial blood gas showed acute hypercapnia and hypoxia, ABG pH 7.27, PCO2 87, PO2 90, with transaminases in the 400s. CT scan of the brain was nonacute but did show some maxillary sinusitis on the right. Chest x-ray was negative. The patient was placed on BiPAP and IV steroids. There is no response to Narcan. Ethanol level was normal.urine drug screen negative. Salicylate and acetaminophen levels were normal. Blood cultures remain negative. By the second hospital day the patient was off BiPAP and oxygenating well on nasal cannula oxygen. She was less lethargic but still has some confusion. She was continued on IV Solu-Medrol bronchodilators and also  given Augmentin for sinusitis. It was felt that her decline was possibly related to use of fentanyl at home in the setting of acute liver injury. Her liver function tests were normalizing within 2 days. Surgery was consulted but felt there was no role for surgical intervention here, her cholecystotomy tube remains in place and is draining. The patient was briefly given IV fluids for some mild volume depletion her Lasix was held throughout the hospitalization. The patient did have a brief episode of age fibrillation with rapid ventricular response on July 26, verapamil was restarted and she quickly converted back to normal sinus rhythm and remained in normal sinus rhythm throughout the rest of hospitalization. The patient was seen by physical therapy. She complains of headaches which was treated with tramadol and Tylenol. Her and her family were counseled on appropriate use of acetaminophen not to exceed 3000 mg a day. Fentanyl was stopped. Tizanidine was stopped. She was not sleeping well and mirtazapine was restarted at discharge.  O2 sat at discharge 87% RA, 96% 2L.  Code status:  Full   Consults: None  Significant Diagnostic Studies: labs: TSH 1.15, at discharge sodium 146, potassium 3.9, chloride 98, bicarbonate 29, BUN 11, creatinine 0.3, albumin 2.8, AST 21, ALT 68, alkaline phosphatase 72, total bilirubin 0.6, CBC WBC 6.5, hemoglobin 9.8.Marland Kitchen Final blood gas pH 7.47, PCO2 47, PO2 74, microbiology: blood culture: negative and radiology: CXR: Stable right apical pleural parenchymal scarring  Treatments: IV hydration, antibiotics: Augmentin, analgesia: acetaminophen and tramadol, cardiac meds: verapamil, anticoagulation: heparin, steroids: solu-medrol and prednisone and therapies: PT  Discharge Exam: Blood pressure 130/52, pulse 55, temperature 97.5 F (36.4 C), temperature source Oral, resp. rate 18, height 5' 6.14" (1.68 m), weight 61.499 kg (135 lb 9.3 oz), SpO2 97.00%. General appearance: alert  and cooperative Resp: clear to auscultation bilaterally Cardio: regular rate and rhythm, S1, S2 normal, no murmur, click, rub or gallop  Disposition: 01-home     Medication List    STOP taking these medications       fentaNYL 50 MCG/HR  Commonly known as:  DURAGESIC - dosed mcg/hr     tiZANidine 4 MG capsule  Commonly known as:  ZANAFLEX      TAKE these medications       albuterol (2.5 MG/3ML) 0.083% nebulizer solution  Commonly known as:  PROVENTIL  Take 2.5 mg by nebulization every 6 (six) hours as needed for wheezing or shortness of breath.     amoxicillin-clavulanate 875-125 MG per tablet  Commonly known as:  AUGMENTIN  Take 1 tablet by mouth every 12 (twelve) hours.     ergocalciferol 50000 UNITS capsule  Commonly known as:  VITAMIN D2  Take 50,000 Units by mouth every 14 (fourteen) days.     fluticasone 50 MCG/ACT nasal spray  Commonly known as:  FLONASE  Place 2 sprays into the nose daily as needed. For allergies     furosemide 20 MG tablet  Commonly known as:  LASIX  Take 1 tablet (20 mg total) by mouth daily.     magnesium hydroxide 400 MG/5ML suspension  Commonly known as:  MILK OF MAGNESIA  Take 30 mLs by mouth daily as needed for mild constipation.     mirtazapine 7.5 MG tablet  Commonly known as:  REMERON  Take 7.5 mg by mouth daily.     multivitamin with minerals Tabs tablet  Take 1 tablet by mouth daily.     omeprazole 20 MG capsule  Commonly known as:  PRILOSEC  Take 20 mg by mouth daily.     polyethylene glycol packet  Commonly known as:  MIRALAX / GLYCOLAX  Take 17 g by mouth daily.     potassium chloride 10 MEQ tablet  Commonly known as:  K-DUR,KLOR-CON  Take 1 tablet (10 mEq total) by mouth daily.     pregabalin 75 MG capsule  Commonly known as:  LYRICA  Take 1 capsule (75 mg total) by mouth 2 (two) times daily.     sertraline 50 MG tablet  Commonly known as:  ZOLOFT  Take 1 tablet (50 mg total) by mouth daily.     tiotropium  18 MCG inhalation capsule  Commonly known as:  SPIRIVA  Place 18 mcg into inhaler and inhale daily.     triamcinolone cream 0.1 %  Commonly known as:  KENALOG  Apply 1 application topically 2 (two) times daily as needed. Swelling in right arm.     verapamil 120 MG CR tablet  Commonly known as:  CALAN-SR  Take 1 tablet (120 mg total) by mouth daily.  Oxygen 2 liter nasal cannula           Follow-up Information   Follow up with Wenda Low, MD In 2 weeks.   Specialty:  Internal Medicine   Contact information:   [address] [place] [address] [phone]       Signed: Irven Shelling 01/20/2014, 7:00 AM

## 2014-01-20 NOTE — Discharge Instructions (Signed)
Do not take more than 6 Tylenol tablets a day. Stop taking fentanyl and tizanidine. Wear oxygen at night when you are sleeping

## 2014-01-20 NOTE — Care Management Note (Signed)
CARE MANAGEMENT NOTE 01/20/2014  Patient:  Theresa Blackwell, Theresa Blackwell   Account Number:  [id number]  Date Initiated:  01/18/2014  Documentation initiated by:  [facility]  Subjective/Objective Assessment:   Acute encephalopathy, hypoxic respiratory failure/severe COPD     Action/Plan:   active with Arville Go, waiting dc recommendations   Anticipated DC Date:     Anticipated DC Plan:  [place]  CM consult      Choice offered to / List presented to:          [facility] arranged  HH-1 RN  [place] [facility]  [facility]   Status of service:  Completed, signed off Medicare Important Message given?  YES (If response is "NO", the following Medicare IM given date fields will be blank) Date Medicare IM given:  01/18/2014 Medicare IM given by:  [facility] Date Additional Medicare IM given:   Additional Medicare IM given by:    Discharge Disposition:    Per UR Regulation:    If discussed at Long Length of Stay Meetings, dates discussed:    Comments:  01/20/2014 Pt for d/c this am, request Arville Go for [facility] services, Arville Go notified of plan to d/c today and need for [facility] and HHPT, services to resume in next 83 hr. Jasmine Pang RN MPH, case manager, [phone]

## 2014-01-20 NOTE — Progress Notes (Signed)
Pt discharge instructions given, pt and family verbalized understanding. VSS. Denies pain. Pleasant. Pt left floor via wheelchair to home accompanied by staff and family.

## 2014-01-22 LAB — CULTURE, BLOOD (ROUTINE X 2)
Culture: NO GROWTH
Culture: NO GROWTH

## 2014-02-01 ENCOUNTER — Other Ambulatory Visit (INDEPENDENT_AMBULATORY_CARE_PROVIDER_SITE_OTHER): Payer: Self-pay | Admitting: Surgery

## 2014-02-01 NOTE — Progress Notes (Signed)
I spoke with Dr. Lysle Rubens who feels that the patient is moderate risk for surgery because of her COPD.  Since she continues to have chronic pain that is likely associated with her GB, he is recommending that we proceed with surgery.  We will plan her surgery at [facility] with at least overnight stay.    Theresa Burn. Georgette Dover, MD, [facility]  General/ Trauma Surgery  02/01/2014 8:32 AM

## 2014-02-02 ENCOUNTER — Emergency Department (HOSPITAL_COMMUNITY): Payer: Medicare Other

## 2014-02-02 ENCOUNTER — Inpatient Hospital Stay (HOSPITAL_COMMUNITY)
Admission: EM | Admit: 2014-02-02 | Discharge: 2014-02-05 | DRG: 091 | Disposition: A | Discharge status: Home Health | Payer: Medicare Other | Attending: Internal Medicine | Admitting: Internal Medicine

## 2014-02-02 ENCOUNTER — Inpatient Hospital Stay (HOSPITAL_COMMUNITY): Payer: Medicare Other

## 2014-02-02 ENCOUNTER — Encounter (HOSPITAL_COMMUNITY): Payer: Self-pay | Admitting: Emergency Medicine

## 2014-02-02 DIAGNOSIS — Z85819 Personal history of malignant neoplasm of unspecified site of lip, oral cavity, and pharynx: Secondary | ICD-10-CM

## 2014-02-02 DIAGNOSIS — Z9981 Dependence on supplemental oxygen: Secondary | ICD-10-CM

## 2014-02-02 DIAGNOSIS — Z981 Arthrodesis status: Secondary | ICD-10-CM | POA: Diagnosis not present

## 2014-02-02 DIAGNOSIS — A498 Other bacterial infections of unspecified site: Secondary | ICD-10-CM | POA: Diagnosis present

## 2014-02-02 DIAGNOSIS — F341 Dysthymic disorder: Secondary | ICD-10-CM | POA: Diagnosis present

## 2014-02-02 DIAGNOSIS — N189 Chronic kidney disease, unspecified: Secondary | ICD-10-CM

## 2014-02-02 DIAGNOSIS — J440 Chronic obstructive pulmonary disease with acute lower respiratory infection: Secondary | ICD-10-CM

## 2014-02-02 DIAGNOSIS — I639 Cerebral infarction, unspecified: Secondary | ICD-10-CM | POA: Diagnosis present

## 2014-02-02 DIAGNOSIS — J9622 Acute and chronic respiratory failure with hypercapnia: Secondary | ICD-10-CM

## 2014-02-02 DIAGNOSIS — G9340 Encephalopathy, unspecified: Secondary | ICD-10-CM

## 2014-02-02 DIAGNOSIS — I890 Lymphedema, not elsewhere classified: Secondary | ICD-10-CM | POA: Diagnosis present

## 2014-02-02 DIAGNOSIS — I5032 Chronic diastolic (congestive) heart failure: Secondary | ICD-10-CM | POA: Diagnosis present

## 2014-02-02 DIAGNOSIS — J4489 Other specified chronic obstructive pulmonary disease: Secondary | ICD-10-CM | POA: Diagnosis present

## 2014-02-02 DIAGNOSIS — R232 Flushing: Secondary | ICD-10-CM

## 2014-02-02 DIAGNOSIS — R7881 Bacteremia: Secondary | ICD-10-CM | POA: Diagnosis present

## 2014-02-02 DIAGNOSIS — R51 Headache: Secondary | ICD-10-CM

## 2014-02-02 DIAGNOSIS — R945 Abnormal results of liver function studies: Secondary | ICD-10-CM

## 2014-02-02 DIAGNOSIS — J9620 Acute and chronic respiratory failure, unspecified whether with hypoxia or hypercapnia: Secondary | ICD-10-CM | POA: Diagnosis present

## 2014-02-02 DIAGNOSIS — IMO0001 Reserved for inherently not codable concepts without codable children: Secondary | ICD-10-CM | POA: Diagnosis present

## 2014-02-02 DIAGNOSIS — T40605A Adverse effect of unspecified narcotics, initial encounter: Secondary | ICD-10-CM | POA: Diagnosis present

## 2014-02-02 DIAGNOSIS — Z8249 Family history of ischemic heart disease and other diseases of the circulatory system: Secondary | ICD-10-CM

## 2014-02-02 DIAGNOSIS — J449 Chronic obstructive pulmonary disease, unspecified: Secondary | ICD-10-CM

## 2014-02-02 DIAGNOSIS — Z9010 Acquired absence of unspecified breast and nipple: Secondary | ICD-10-CM

## 2014-02-02 DIAGNOSIS — D72829 Elevated white blood cell count, unspecified: Secondary | ICD-10-CM

## 2014-02-02 DIAGNOSIS — F418 Other specified anxiety disorders: Secondary | ICD-10-CM | POA: Diagnosis present

## 2014-02-02 DIAGNOSIS — I4891 Unspecified atrial fibrillation: Secondary | ICD-10-CM | POA: Diagnosis present

## 2014-02-02 DIAGNOSIS — G92 Toxic encephalopathy: Principal | ICD-10-CM | POA: Diagnosis present

## 2014-02-02 DIAGNOSIS — Z79899 Other long term (current) drug therapy: Secondary | ICD-10-CM | POA: Diagnosis not present

## 2014-02-02 DIAGNOSIS — K219 Gastro-esophageal reflux disease without esophagitis: Secondary | ICD-10-CM | POA: Diagnosis present

## 2014-02-02 DIAGNOSIS — R932 Abnormal findings on diagnostic imaging of liver and biliary tract: Secondary | ICD-10-CM

## 2014-02-02 DIAGNOSIS — F17200 Nicotine dependence, unspecified, uncomplicated: Secondary | ICD-10-CM | POA: Diagnosis present

## 2014-02-02 DIAGNOSIS — Z800 Family history of malignant neoplasm of digestive organs: Secondary | ICD-10-CM | POA: Diagnosis not present

## 2014-02-02 DIAGNOSIS — M797 Fibromyalgia: Secondary | ICD-10-CM

## 2014-02-02 DIAGNOSIS — E876 Hypokalemia: Secondary | ICD-10-CM

## 2014-02-02 DIAGNOSIS — Z853 Personal history of malignant neoplasm of breast: Secondary | ICD-10-CM | POA: Diagnosis not present

## 2014-02-02 DIAGNOSIS — J209 Acute bronchitis, unspecified: Secondary | ICD-10-CM | POA: Diagnosis present

## 2014-02-02 DIAGNOSIS — E785 Hyperlipidemia, unspecified: Secondary | ICD-10-CM

## 2014-02-02 DIAGNOSIS — Z720 Tobacco use: Secondary | ICD-10-CM

## 2014-02-02 DIAGNOSIS — G929 Unspecified toxic encephalopathy: Secondary | ICD-10-CM | POA: Diagnosis present

## 2014-02-02 DIAGNOSIS — R0602 Shortness of breath: Secondary | ICD-10-CM

## 2014-02-02 DIAGNOSIS — F419 Anxiety disorder, unspecified: Secondary | ICD-10-CM

## 2014-02-02 DIAGNOSIS — N179 Acute kidney failure, unspecified: Secondary | ICD-10-CM

## 2014-02-02 DIAGNOSIS — N390 Urinary tract infection, site not specified: Secondary | ICD-10-CM

## 2014-02-02 DIAGNOSIS — K810 Acute cholecystitis: Secondary | ICD-10-CM | POA: Diagnosis present

## 2014-02-02 DIAGNOSIS — R4182 Altered mental status, unspecified: Secondary | ICD-10-CM

## 2014-02-02 DIAGNOSIS — R5381 Other malaise: Secondary | ICD-10-CM

## 2014-02-02 DIAGNOSIS — I6350 Cerebral infarction due to unspecified occlusion or stenosis of unspecified cerebral artery: Secondary | ICD-10-CM

## 2014-02-02 DIAGNOSIS — E43 Unspecified severe protein-calorie malnutrition: Secondary | ICD-10-CM

## 2014-02-02 DIAGNOSIS — R7989 Other specified abnormal findings of blood chemistry: Secondary | ICD-10-CM

## 2014-02-02 DIAGNOSIS — I471 Supraventricular tachycardia: Secondary | ICD-10-CM

## 2014-02-02 LAB — I-STAT ARTERIAL BLOOD GAS, ED
Acid-Base Excess: 9 mmol/L — ABNORMAL HIGH (ref 0.0–2.0)
Bicarbonate: 36.7 mEq/L — ABNORMAL HIGH (ref 20.0–24.0)
O2 Saturation: 95 %
PCO2 ART: 62.9 mmHg — AB (ref 35.0–45.0)
PH ART: 7.374 (ref 7.350–7.450)
Patient temperature: 98.6
TCO2: 39 mmol/L (ref 0–100)
pO2, Arterial: 81 mmHg (ref 80.0–100.0)

## 2014-02-02 LAB — CBC WITH DIFFERENTIAL/PLATELET
Basophils Absolute: 0 10*3/uL (ref 0.0–0.1)
Basophils Relative: 0 % (ref 0–1)
EOS PCT: 0 % (ref 0–5)
Eosinophils Absolute: 0 10*3/uL (ref 0.0–0.7)
HCT: 35.3 % — ABNORMAL LOW (ref 36.0–46.0)
Hemoglobin: 10.8 g/dL — ABNORMAL LOW (ref 12.0–15.0)
Lymphocytes Relative: 12 % (ref 12–46)
Lymphs Abs: 1 10*3/uL (ref 0.7–4.0)
MCH: 31.5 pg (ref 26.0–34.0)
MCHC: 30.6 g/dL (ref 30.0–36.0)
MCV: 102.9 fL — AB (ref 78.0–100.0)
Monocytes Absolute: 0.9 10*3/uL (ref 0.1–1.0)
Monocytes Relative: 11 % (ref 3–12)
Neutro Abs: 6.6 10*3/uL (ref 1.7–7.7)
Neutrophils Relative %: 77 % (ref 43–77)
PLATELETS: 146 10*3/uL — AB (ref 150–400)
RBC: 3.43 MIL/uL — ABNORMAL LOW (ref 3.87–5.11)
RDW: 14.1 % (ref 11.5–15.5)
WBC: 8.6 10*3/uL (ref 4.0–10.5)

## 2014-02-02 LAB — COMPREHENSIVE METABOLIC PANEL
ALT: 9 U/L (ref 0–35)
AST: 12 U/L (ref 0–37)
Albumin: 3.3 g/dL — ABNORMAL LOW (ref 3.5–5.2)
Alkaline Phosphatase: 65 U/L (ref 39–117)
Anion gap: 9 (ref 5–15)
BUN: 15 mg/dL (ref 6–23)
CO2: 35 mEq/L — ABNORMAL HIGH (ref 19–32)
Calcium: 10.1 mg/dL (ref 8.4–10.5)
Chloride: 98 mEq/L (ref 96–112)
Creatinine, Ser: 0.44 mg/dL — ABNORMAL LOW (ref 0.50–1.10)
GFR calc Af Amer: >90 mL/min (ref >90)
GFR calc non Af Amer: >90 mL/min (ref >90)
Glucose, Bld: 94 mg/dL (ref 70–99)
Potassium: 4.2 mEq/L (ref 3.7–5.3)
Sodium: 142 mEq/L (ref 137–147)
Total Bilirubin: 0.8 mg/dL (ref 0.3–1.2)
Total Protein: 6.4 g/dL (ref 6.0–8.3)

## 2014-02-02 LAB — GLUCOSE, CAPILLARY: Glucose-Capillary: 116 mg/dL — ABNORMAL HIGH (ref 70–99)

## 2014-02-02 LAB — URINALYSIS, ROUTINE W REFLEX MICROSCOPIC
Bilirubin Urine: NEGATIVE
Glucose, UA: NEGATIVE mg/dL
Ketones, ur: NEGATIVE mg/dL
Nitrite: POSITIVE — AB
Protein, ur: 30 mg/dL — AB
SPECIFIC GRAVITY, URINE: 1.016 (ref 1.005–1.030)
Urobilinogen, UA: 0.2 mg/dL (ref 0.0–1.0)
pH: 5.5 (ref 5.0–8.0)

## 2014-02-02 LAB — URINE MICROSCOPIC-ADD ON

## 2014-02-02 LAB — AMMONIA: Ammonia: 29 umol/L (ref 11–60)

## 2014-02-02 LAB — ETHANOL

## 2014-02-02 LAB — I-STAT CG4 LACTIC ACID, ED: LACTIC ACID, VENOUS: 0.69 mmol/L (ref 0.5–2.2)

## 2014-02-02 MED ORDER — SERTRALINE HCL 50 MG PO TABS
50.0000 mg | ORAL_TABLET | Freq: Every day | ORAL | Status: DC
Start: 2014-02-03 — End: 2014-02-05
  Administered 2014-02-04 – 2014-02-05 (×2): 50 mg via ORAL
  Filled 2014-02-02 (×2): qty 1

## 2014-02-02 MED ORDER — FENTANYL 50 MCG/HR TD PT72: 75.0000 ug | MEDICATED_PATCH | TRANSDERMAL | Status: DC

## 2014-02-02 MED ORDER — NALOXONE HCL 0.4 MG/ML IJ SOLN
INTRAMUSCULAR | Status: AC
  Administered 2014-02-02: 0.4 mg
  Filled 2014-02-02: qty 1

## 2014-02-02 MED ORDER — FUROSEMIDE 40 MG PO TABS
40.0000 mg | ORAL_TABLET | Freq: Two times a day (BID) | ORAL | Status: DC
  Administered 2014-02-04 – 2014-02-05 (×3): 40 mg via ORAL
  Filled 2014-02-02 (×3): qty 1

## 2014-02-02 MED ORDER — ENOXAPARIN SODIUM 40 MG/0.4ML ~~LOC~~ SOLN
40.0000 mg | SUBCUTANEOUS | Status: DC
  Administered 2014-02-02 – 2014-02-04 (×3): 40 mg via SUBCUTANEOUS
  Filled 2014-02-02 (×3): qty 0.4

## 2014-02-02 MED ORDER — ALBUTEROL SULFATE (2.5 MG/3ML) 0.083% IN NEBU: 2.5000 mg | INHALATION_SOLUTION | Freq: Four times a day (QID) | RESPIRATORY_TRACT | Status: DC | PRN

## 2014-02-02 MED ORDER — ACETAMINOPHEN 325 MG PO TABS
650.0000 mg | ORAL_TABLET | ORAL | Status: DC | PRN
Start: 2014-02-02 — End: 2014-02-05
  Administered 2014-02-04 (×2): 650 mg via ORAL
  Filled 2014-02-02 (×3): qty 2

## 2014-02-02 MED ORDER — TIOTROPIUM BROMIDE MONOHYDRATE 18 MCG IN CAPS
18.0000 ug | ORAL_CAPSULE | Freq: Every day | RESPIRATORY_TRACT | Status: DC
  Administered 2014-02-05: 18 ug via RESPIRATORY_TRACT
  Filled 2014-02-02: qty 5

## 2014-02-02 MED ORDER — ADULT MULTIVITAMIN W/MINERALS CH
1.0000 | ORAL_TABLET | Freq: Every day | ORAL | Status: DC
  Administered 2014-02-02 – 2014-02-05 (×3): 1 via ORAL
  Filled 2014-02-02 (×3): qty 1

## 2014-02-02 MED ORDER — TIZANIDINE HCL 4 MG PO TABS
4.0000 mg | ORAL_TABLET | Freq: Every day | ORAL | Status: DC
  Administered 2014-02-02 – 2014-02-03 (×2): 4 mg via ORAL
  Filled 2014-02-02 (×2): qty 1

## 2014-02-02 MED ORDER — MORPHINE SULFATE ER 15 MG PO TBCR
15.0000 mg | EXTENDED_RELEASE_TABLET | Freq: Two times a day (BID) | ORAL | Status: DC
  Administered 2014-02-02: 15 mg via ORAL
  Filled 2014-02-02: qty 1

## 2014-02-02 MED ORDER — ALBUTEROL SULFATE (2.5 MG/3ML) 0.083% IN NEBU: 3.0000 mL | INHALATION_SOLUTION | RESPIRATORY_TRACT | Status: DC | PRN

## 2014-02-02 MED ORDER — POLYETHYLENE GLYCOL 3350 17 G PO PACK
17.0000 g | PACK | Freq: Every day | ORAL | Status: DC
  Administered 2014-02-04 – 2014-02-05 (×2): 17 g via ORAL
  Filled 2014-02-02 (×2): qty 1

## 2014-02-02 MED ORDER — NALOXONE HCL 0.4 MG/ML IJ SOLN: 0.4000 mg | Freq: Once | INTRAMUSCULAR | Status: AC

## 2014-02-02 MED ORDER — SENNOSIDES-DOCUSATE SODIUM 8.6-50 MG PO TABS: 1.0000 | ORAL_TABLET | Freq: Every evening | ORAL | Status: DC | PRN

## 2014-02-02 MED ORDER — STROKE: EARLY STAGES OF RECOVERY BOOK
Freq: Once | Status: AC
  Administered 2014-02-02: 1
  Filled 2014-02-02: qty 1

## 2014-02-02 MED ORDER — PANTOPRAZOLE SODIUM 40 MG PO TBEC
40.0000 mg | DELAYED_RELEASE_TABLET | Freq: Every day | ORAL | Status: DC
  Administered 2014-02-02 – 2014-02-05 (×3): 40 mg via ORAL
  Filled 2014-02-02 (×3): qty 1

## 2014-02-02 MED ORDER — FENTANYL 50 MCG/HR TD PT72: 50.0000 ug | MEDICATED_PATCH | TRANSDERMAL | Status: DC

## 2014-02-02 MED ORDER — ALUM & MAG HYDROXIDE-SIMETH 200-200-20 MG/5ML PO SUSP: 30.0000 mL | Freq: Every day | ORAL | Status: DC | PRN

## 2014-02-02 MED ORDER — VITAMIN B-1 100 MG PO TABS
100.0000 mg | ORAL_TABLET | Freq: Every day | ORAL | Status: DC
  Administered 2014-02-02 – 2014-02-05 (×3): 100 mg via ORAL
  Filled 2014-02-02 (×3): qty 1

## 2014-02-02 MED ORDER — NALOXONE HCL 0.4 MG/ML IJ SOLN
INTRAMUSCULAR | Status: AC
Start: 2014-02-02 — End: 2014-02-02
  Filled 2014-02-02: qty 1

## 2014-02-02 MED ORDER — POTASSIUM CHLORIDE CRYS ER 20 MEQ PO TBCR
20.0000 meq | EXTENDED_RELEASE_TABLET | Freq: Two times a day (BID) | ORAL | Status: DC
  Administered 2014-02-03 – 2014-02-05 (×4): 20 meq via ORAL
  Filled 2014-02-02 (×4): qty 1

## 2014-02-02 MED ORDER — BUSPIRONE HCL 10 MG PO TABS
10.0000 mg | ORAL_TABLET | Freq: Three times a day (TID) | ORAL | Status: DC
  Administered 2014-02-02 – 2014-02-05 (×6): 10 mg via ORAL
  Filled 2014-02-02 (×6): qty 1

## 2014-02-02 MED ORDER — TRIAMCINOLONE ACETONIDE 0.1 % EX CREA
1.0000 "application " | TOPICAL_CREAM | Freq: Two times a day (BID) | CUTANEOUS | Status: DC | PRN
  Filled 2014-02-02: qty 15

## 2014-02-02 MED ORDER — ACETAMINOPHEN 650 MG RE SUPP: 650.0000 mg | RECTAL | Status: DC | PRN

## 2014-02-02 MED ORDER — VERAPAMIL HCL ER 120 MG PO TBCR: 120.0000 mg | EXTENDED_RELEASE_TABLET | Freq: Every day | ORAL | Status: DC

## 2014-02-02 MED ORDER — PREGABALIN 75 MG PO CAPS
75.0000 mg | ORAL_CAPSULE | Freq: Two times a day (BID) | ORAL | Status: DC
  Administered 2014-02-02 – 2014-02-05 (×5): 75 mg via ORAL
  Filled 2014-02-02 (×5): qty 1

## 2014-02-02 MED ORDER — NALOXONE HCL 0.4 MG/ML IJ SOLN
0.4000 mg | INTRAMUSCULAR | Status: DC | PRN
  Administered 2014-02-02: 0.4 mg via INTRAVENOUS

## 2014-02-02 MED ORDER — ASPIRIN 300 MG RE SUPP: 300.0000 mg | Freq: Every day | RECTAL | Status: DC

## 2014-02-02 MED ORDER — DEXTROSE 5 % IV SOLN
1.0000 g | Freq: Once | INTRAVENOUS | Status: AC
  Administered 2014-02-02: 1 g via INTRAVENOUS
  Filled 2014-02-02: qty 10

## 2014-02-02 MED ORDER — MIRTAZAPINE 7.5 MG PO TABS
7.5000 mg | ORAL_TABLET | Freq: Every day | ORAL | Status: DC
  Administered 2014-02-02 – 2014-02-03 (×2): 7.5 mg via ORAL
  Filled 2014-02-02 (×5): qty 1

## 2014-02-02 MED ORDER — ASPIRIN 325 MG PO TABS
325.0000 mg | ORAL_TABLET | Freq: Every day | ORAL | Status: DC
  Administered 2014-02-02 – 2014-02-05 (×3): 325 mg via ORAL
  Filled 2014-02-02 (×3): qty 1

## 2014-02-02 MED ORDER — ONDANSETRON HCL 4 MG/2ML IJ SOLN
4.0000 mg | INTRAMUSCULAR | Status: DC | PRN
  Administered 2014-02-02: 4 mg via INTRAVENOUS
  Filled 2014-02-02: qty 2

## 2014-02-02 MED ORDER — LORAZEPAM 0.5 MG PO TABS
0.5000 mg | ORAL_TABLET | Freq: Every day | ORAL | Status: DC
  Administered 2014-02-02 – 2014-02-03 (×2): 0.5 mg via ORAL
  Filled 2014-02-02 (×2): qty 1

## 2014-02-02 MED ORDER — HYDROCODONE-ACETAMINOPHEN 5-325 MG PO TABS: 1.0000 | ORAL_TABLET | ORAL | Status: DC | PRN

## 2014-02-02 MED ORDER — DEXTROSE 5 % IV SOLN
1.0000 g | INTRAVENOUS | Status: DC
  Administered 2014-02-04: 1 g via INTRAVENOUS
  Filled 2014-02-02 (×3): qty 10

## 2014-02-02 MED ORDER — LORAZEPAM 0.5 MG PO TABS: 0.2500 mg | ORAL_TABLET | Freq: Two times a day (BID) | ORAL | Status: DC

## 2014-02-02 MED ORDER — ONDANSETRON HCL 4 MG PO TABS: 4.0000 mg | ORAL_TABLET | Freq: Four times a day (QID) | ORAL | Status: DC | PRN

## 2014-02-02 MED ORDER — LORAZEPAM 0.5 MG PO TABS: 0.2500 mg | ORAL_TABLET | Freq: Every morning | ORAL | Status: DC

## 2014-02-02 MED ORDER — SODIUM CHLORIDE 0.9 % IV BOLUS (SEPSIS)
250.0000 mL | INTRAVENOUS | Status: AC
  Administered 2014-02-02: 250 mL via INTRAVENOUS

## 2014-02-02 MED ORDER — HYDROCODONE-ACETAMINOPHEN 5-325 MG PO TABS: 1.0000 | ORAL_TABLET | Freq: Four times a day (QID) | ORAL | Status: DC | PRN

## 2014-02-02 NOTE — ED Notes (Signed)
Respiratory [name]) called for Arterial blood gas.  Phlebotomy at bedside.

## 2014-02-02 NOTE — ED Provider Notes (Signed)
Medical screening examination/treatment/procedure(s) were conducted as a shared visit with non-physician practitioner(s) and myself.  I personally evaluated the patient during the encounter.  80yo F, c/o AMS since this morning. LSW last night. Family felt pt was also SOB despite wearing her usual home O2 N/C. EMS gave neb en route. VSS, awake/alert/confused re: events, lungs diminished without wheezing, RRR, abd soft/NT, +cholecystotomy tube in place and draining, neuro with RUE weakness that pt's son states is not new. ABG compensated resp acidosis, CT-H with possible pontine infarct, +UTI/UC pending. IV abx, Neuro MD consult, medicine admit.      EKG Interpretation   Date/Time:  Tuesday February 02 2014 11:17:47 EDT Ventricular Rate:  74 PR Interval:  153 QRS Duration: 105 QT Interval:  403 QTC Calculation: 447 R Axis:   -60 Text Interpretation:  Sinus rhythm LAD, consider left anterior fascicular  block Abnormal R-wave progression, early transition LVH with secondary  repolarization abnormality When compared with ECG of 01/15/2014 No  significant change was found Confirmed by [facility]  MD, Ruby Dilone ([id number])  on 02/02/2014 11:47:52 AM        Francine Graven, DO 02/04/14 1552

## 2014-02-02 NOTE — ED Notes (Signed)
Rocephin held for blood cultures.

## 2014-02-02 NOTE — H&P (Addendum)
[facility] History and Physical  Theresa Blackwell JIR:[id number] DOB: 14-Oct-1930 DOA: 02/02/2014  Referring physician: EDP PCP: Wenda Low, MD   Chief Complaint: Altered mental status and shortness of breath  HPI: Theresa Blackwell is a 80 y.o. female multiple medical problems including history of acute cholecystitis status post cholecystostomy tube, COPD/chronic respiratory failure -home O2-dependent on 3 L, chronic pain on multiple narcotics Who presented with AMS and SOB. She was treated with nebs and her oxygenation improved on his cannula O2 per EDP. Urinalysis was consistent with a UTI. In ED patient was noted to have right upper extremity weakness, a CT scan of her brain was done and it showed low attenuation in the left side of pons concerning for an acute pontine infarct. Neuro was consulted and admission to [facility] requested for further evaluation and management.  In ED patient was started on empiric antibiotics for UTI Patient denies cough, chest pain. She is on his cannula O2, and denies shortness of breath at this time.   Review of Systems The patient denies anorexia, fever, weight loss,, vision loss, decreased hearing, hoarseness, chest pain, syncope,  peripheral edema, balance deficits, hemoptysis, melena, hematochezia, severe indigestion/heartburn, hematuria, incontinence, genital sores, suspicious skin lesions.  Past Medical History  Diagnosis Date  . Anxiety   . Smoking   . Fibromyalgia   . Depression   . Hyperlipidemia   . Hypercalcemia   . Adrenal adenoma     Stable as of 05/2012 CT scan  . Chronic respiratory failure   . Coronary artery disease   . GERD (gastroesophageal reflux disease)   . Lymphedema syndrome, postmastectomy     RUE  . Breast CA 1970    rt breast  . CHF (congestive heart failure)   . COPD (chronic obstructive pulmonary disease)   . On home oxygen therapy     "3L; 24/7" (11/11/2013)  . DJD (degenerative joint disease)   . Arthritis      "joints" (11/11/2013)  . Chronic back pain     "shoulders to lower back" (11/11/2013)  . Frequent UTI    Past Surgical History  Procedure Laterality Date  . Abdominal hysterectomy    . Appendectomy    . Squamous cell carcinoma excision      Floor of mouth  . Colonoscopy w/ biopsies and polypectomy    . Nasal sinus surgery  1994  . Eus  04/03/2012    Procedure: UPPER ENDOSCOPIC ULTRASOUND (EUS) LINEAR;  Surgeon: Milus Banister, MD;  Location: [facility] ENDOSCOPY;  Service: Endoscopy;  Laterality: N/A;  radial linear  . Mastectomy, radical Right 1970  . Breast biopsy Right ~ 1970  . Refractive surgery Bilateral ~ 2012  . Back surgery    . Posterior lumbar fusion  ?1970's   Social History:  reports that she has been smoking Cigarettes.  She has a 64 pack-year smoking history. She has never used smokeless tobacco. She reports that she does not drink alcohol or use illicit drugs.  No Known Allergies  Family History  Problem Relation Age of Onset  . Colon cancer Brother   . Heart disease Father      Prior to Admission medications   Medication Sig Start Date End Date Taking? Authorizing Provider  albuterol (PROAIR HFA) 108 (90 BASE) MCG/ACT inhaler Inhale 2 puffs into the lungs every 4 (four) hours as needed for wheezing or shortness of breath.   Yes Historical Provider, MD  alum & mag hydroxide-simeth (MAALOX/MYLANTA) 200-200-20 MG/5ML suspension Take 30  mLs by mouth daily as needed for indigestion or heartburn.   Yes Historical Provider, MD  busPIRone (BUSPAR) 10 MG tablet Take 10 mg by mouth 3 (three) times daily.  01/16/14  Yes Historical Provider, MD  furosemide (LASIX) 40 MG tablet Take 40 mg by mouth 2 (two) times daily.   Yes Historical Provider, MD  HYDROcodone-acetaminophen (NORCO/VICODIN) 5-325 MG per tablet Take 1-2 tablets by mouth every 4 (four) hours as needed for moderate pain.   Yes Historical Provider, MD  LORazepam (ATIVAN) 0.5 MG tablet Take 0.25-0.5 mg by mouth 2 (two)  times daily. Take 1/2 tablet (0.25 mg) every morning and 1 tablet (0.5 mg) every night at bedtime   Yes Historical Provider, MD  mirtazapine (REMERON) 7.5 MG tablet Take 7.5 mg by mouth at bedtime.   Yes Historical Provider, MD  morphine (MS CONTIN) 15 MG 12 hr tablet Take 15 mg by mouth every 12 (twelve) hours.  01/28/14  Yes Historical Provider, MD  ondansetron (ZOFRAN) 4 MG tablet Take 4 mg by mouth every 6 (six) hours as needed for nausea or vomiting.   Yes Historical Provider, MD  potassium chloride SA (K-DUR,KLOR-CON) 20 MEQ tablet Take 20 mEq by mouth 2 (two) times daily.   Yes Historical Provider, MD  pregabalin (LYRICA) 75 MG capsule Take 1 capsule (75 mg total) by mouth 2 (two) times daily. 03/10/12  Yes Wenda Low, MD  sertraline (ZOLOFT) 50 MG tablet Take 1 tablet (50 mg total) by mouth daily. 11/13/13  Yes Wenda Low, MD  thiamine (VITAMIN B-1) 100 MG tablet Take 100 mg by mouth daily.   Yes Historical Provider, MD  tiotropium (SPIRIVA) 18 MCG inhalation capsule Place 18 mcg into inhaler and inhale daily as needed (shortness of breath/wheezing).    Yes Historical Provider, MD  tiZANidine (ZANAFLEX) 4 MG tablet Take 4 mg by mouth at bedtime.  01/31/14  Yes Historical Provider, MD  triamcinolone cream (KENALOG) 0.1 % Apply 1 application topically 2 (two) times daily as needed (swelling in right arm).    Yes Historical Provider, MD  albuterol (PROVENTIL) (2.5 MG/3ML) 0.083% nebulizer solution Take 2.5 mg by nebulization every 6 (six) hours as needed for wheezing or shortness of breath.    Historical Provider, MD  amoxicillin-clavulanate (AUGMENTIN) 875-125 MG per tablet Take 1 tablet by mouth every 12 (twelve) hours. 01/20/14   Irven Shelling, MD  ergocalciferol (VITAMIN D2) 50000 UNITS capsule Take 50,000 Units by mouth every 14 (fourteen) days.     Historical Provider, MD  fentaNYL (DURAGESIC - DOSED MCG/HR) 75 MCG/HR Place 75 mcg onto the skin every 3 (three) days.  10/26/13   Historical  Provider, MD  Multiple Vitamin (MULTIVITAMIN WITH MINERALS) TABS Take 1 tablet by mouth daily. 03/09/12   Wenda Low, MD  omeprazole (PRILOSEC) 20 MG capsule Take 20 mg by mouth daily.      Historical Provider, MD  polyethylene glycol (MIRALAX / GLYCOLAX) packet Take 17 g by mouth daily.    Historical Provider, MD  prochlorperazine (COMPAZINE) 10 MG tablet  02/01/14   Historical Provider, MD  verapamil (CALAN-SR) 120 MG CR tablet Take 1 tablet (120 mg total) by mouth daily. 11/13/13   Wenda Low, MD   Physical Exam: Filed Vitals:   02/02/14 1800  BP: 127/57  Pulse: 89  Temp: 99.3 F (37.4 C)  Resp: 20    BP 127/57  Pulse 89  Temp(Src) 99.3 F (37.4 C) (Oral)  Resp 20  Ht 5\' 6"  (1.676  m)  Wt 61.5 kg (135 lb 9.3 oz)  BMI 21.89 kg/m2  SpO2 95% Constitutional: Vital signs reviewed.  Patient is alert and oriented x2 in no acute distress, intermittent confusion.  Head: Normocephalic and atraumatic Mouth: no erythema or exudates,slightly dry MM Eyes: PERRL, EOMI, conjunctivae normal, No scleral icterus.  Neck: Supple, Trachea midline normal ROM, No JVD, mass, thyromegaly, or carotid bruit present.  Cardiovascular: RRR, S1 normal, S2 normal, no MRG, pulses symmetric and intact bilaterally Pulmonary/Chest: normal respiratory effort, CTAB, no wheezes, rales, or rhonchi Abdominal: Soft. Suprapubic tenderness, no rebound non-distended, bowel sounds are normal, no masses, organomegaly, cholecystostomy tube with greenish drainage GU: no CVA tenderness  Extremities: She has appears to be a lymphedema sleeve on her right upper extremity. Lower extremities with no cyanosis and no edema  Neurological: A&O x2 right upper extremity strength 3/5, normal left sided strength. cranial nerve II-XII are grossly intact, Skin: Warm, dry and intact. No rash, cyanosis, or clubbing.  Psychiatric: Normal mood and affect.               Labs on Admission:  Basic Metabolic Panel:  Recent Labs Lab  02/02/14 1045  NA 142  K 4.2  CL 98  CO2 35*  GLUCOSE 94  BUN 15  CREATININE 0.44*  CALCIUM 10.1   Liver Function Tests:  Recent Labs Lab 02/02/14 1045  AST 12  ALT 9  ALKPHOS 65  BILITOT 0.8  PROT 6.4  ALBUMIN 3.3*   No results found for this basename: LIPASE, AMYLASE,  in the last 168 hours  Recent Labs Lab 02/02/14 1045  AMMONIA 29   CBC:  Recent Labs Lab 02/02/14 1045  WBC 8.6  NEUTROABS 6.6  HGB 10.8*  HCT 35.3*  MCV 102.9*  PLT 146*   Cardiac Enzymes: No results found for this basename: CKTOTAL, CKMB, CKMBINDEX, TROPONINI,  in the last 168 hours  BNP (last 3 results) No results found for this basename: PROBNP,  in the last 8760 hours CBG: No results found for this basename: GLUCAP,  in the last 168 hours  Radiological Exams on Admission: Dg Chest 2 View  02/02/2014   CLINICAL DATA:  Shortness some breath, confusion  EXAM: CHEST  2 VIEW  COMPARISON:  01/15/2014  FINDINGS: Cardiomediastinal silhouette is stable. No acute infiltrate or pleural effusion. No pulmonary edema. Mild degenerative changes thoracic spine. Stable right apical pleural parenchymal scarring.  IMPRESSION: No active cardiopulmonary disease.   Electronically Signed   By: Lahoma Crocker M.D.   On: 02/02/2014 11:47   Ct Head Wo Contrast  02/02/2014   CLINICAL DATA:  Shortness of breath, altered mental status  EXAM: CT HEAD WITHOUT CONTRAST  TECHNIQUE: Contiguous axial images were obtained from the base of the skull through the vertex without intravenous contrast.  COMPARISON:  01/15/2014,  FINDINGS: There is no evidence of mass effect, midline shift, or extra-axial fluid collections. There is no evidence of a space-occupying lesion or intracranial hemorrhage. There is no evidence of a cortical-based area of acute infarction. There is relative low attenuation in the left side of the pons concerning for an acute pontine infarct. There is generalized cerebral atrophy. There is periventricular white  matter low attenuation likely secondary to microangiopathy.  The ventricles and sulci are appropriate for the patient's age. The basal cisterns are patent.  Visualized portions of the orbits are unremarkable. The visualized portions of the paranasal sinuses and mastoid air cells are unremarkable. Cerebrovascular atherosclerotic calcifications are noted.  The  osseous structures are unremarkable.  IMPRESSION: 1. Relative low attenuation in the left side of the pons concerning for an acute pontine infarct.   Electronically Signed   By: Kathreen Devoid   On: 02/02/2014 12:56    EKG: Independently reviewed.  Assessment/Plan Active Problems:   Present on Admission:  . CVA (cerebral infarction) -As discussed above will obtain a stroke workup as per protocol>> MRI/MRA, carotid Dopplers, 2-D echo , also A1c, fasting lipid profile and follow  -Will place on aspirin -ST PT OT consults -Neuro consulted per EDP await eval and further recommendations . UTI  -Obtain blood and urine cultures  -Continue empiric antibiotics with Rocephin pending cultures  . Chronic diastolic heart failure -appears compensated  -caution with IVF -Follow and resume outpt Lasix in am -strict I&O's daily weights follow and further treat accordingly . Paroxysmal Atrial Fibrillation -Currently normal sinus rhythm, continue verapamil  -place on aspirin as above, follow  . acute on chronic respiratory failure/COPD (chronic obstructive pulmonary disease) with hypercapnia -ABG in the ED with each 7.37, PO2 of 62.9 PO2 81 and O2 sat of 95% -she is on multiple narcotics and this is possible contributing factor -Decrease narcotics meds doses and frequency, follow -Continue supplemental oxygen, she is home O2 dependent  -Improved with NEbs, continue outpatient meds and when necessary bronchodilators -Chest x-ray shows no acute infiltrates -Cycle cardiac enzymes, follow and consult cardiology as appropriate pending results -Follow and  recheck ABG in am . history of acute cholecystitis status post cholecystostomy tube. -Cholecystostomy seems to be draining well at this time, LFTs within normal limits.  -Per Dr.Tsuei's note of 8/10 surgery was being considered>>follow and consult in am as appropriate.  . Fibromyalgia/chronic pain  -Decrease narcotics frequency and doses for now as discussed above  . Depression with anxiety -Continue outpatient meds  . GERD . Chronic right upper extremity edema- continue compressive/lymphedema sleeve      Code Status: full Family Communication: family at bedside Disposition Plan: admitted to neuro telemetry bed   Time spent: >78mins  [facility] Pager [phone]

## 2014-02-02 NOTE — ED Notes (Signed)
Per EMS, pt lives at home with son and grandson; called out for AMS and dyspnea; home oxygen at 3L [place]- 91% on arrival; received albuterol neb in route ; BS 108, BP 110/60; recent admit for same in July; pt has drain to gallbladder- MD has prescribed her morphine (recent change from tylenol to morphine)- family did not give morphine last night

## 2014-02-02 NOTE — Progress Notes (Signed)
Pt arrived to 4N unit.  Oriented and settled into her room.  Pt uncooperative with the majority of assessment.  Pt pleasantly confused.  A/O to her name, DOB, why she's here and hospital name.  Pt grandson at bedside.  Will continue to monitor. Cori Razor, RN 02/02/2014 5:06 PM

## 2014-02-02 NOTE — ED Provider Notes (Signed)
CSN: [id number]     Arrival date & time 02/02/14  3825 History   First MD Initiated Contact with Patient 02/02/14 1012     Chief Complaint  Patient presents with  . Shortness of Breath     (Consider location/radiation/quality/duration/timing/severity/associated sxs/prior Treatment) HPI Comments: Patient is a 80 y.o. F with history of depression, fibromyalgia, chronic respiratory failure, COPD on 3 L of oxygen at home at all times, presenting from home with a chief complaint of shortness breath and altered mental status. Per EMS the patient 91% on 3 L, patient received nebulizer in route. Patient's family is not at bedside at this time.  The patient does not have any complaints and does not know why she is in the ED at this time. Level 5 Caveat due to history of dementia.  Patient is a 80 y.o. female presenting with shortness of breath. The history is provided by the patient. No language interpreter was used.  Shortness of Breath   Past Medical History  Diagnosis Date  . Anxiety   . Smoking   . Fibromyalgia   . Depression   . Hyperlipidemia   . Hypercalcemia   . Adrenal adenoma     Stable as of 05/2012 CT scan  . Chronic respiratory failure   . Coronary artery disease   . GERD (gastroesophageal reflux disease)   . Lymphedema syndrome, postmastectomy     RUE  . Breast CA 1970    rt breast  . CHF (congestive heart failure)   . COPD (chronic obstructive pulmonary disease)   . On home oxygen therapy     "3L; 24/7" (11/11/2013)  . DJD (degenerative joint disease)   . Arthritis     "joints" (11/11/2013)  . Chronic back pain     "shoulders to lower back" (11/11/2013)  . Frequent UTI    Past Surgical History  Procedure Laterality Date  . Abdominal hysterectomy    . Appendectomy    . Squamous cell carcinoma excision      Floor of mouth  . Colonoscopy w/ biopsies and polypectomy    . Nasal sinus surgery  1994  . Eus  04/03/2012    Procedure: UPPER ENDOSCOPIC ULTRASOUND (EUS)  LINEAR;  Surgeon: Milus Banister, MD;  Location: [facility];  Service: Endoscopy;  Laterality: N/A;  radial linear  . Mastectomy, radical Right 1970  . Breast biopsy Right ~ 1970  . Refractive surgery Bilateral ~ 2012  . Back surgery    . Posterior lumbar fusion  ?1970's   Family History  Problem Relation Age of Onset  . Colon cancer Brother   . Heart disease Father    History  Substance Use Topics  . Smoking status: Current Every Day Smoker -- 1.00 packs/day for 64 years    Types: Cigarettes  . Smokeless tobacco: Never Used  . Alcohol Use: No     Comment: stopped in 2013 but drank 1/5 every couple of days   OB History   Grav Para Term Preterm Abortions TAB SAB Ect Mult Living                 Review of Systems  Unable to perform ROS: Dementia      Allergies  Review of patient's allergies indicates no known allergies.  Home Medications   Prior to Admission medications   Medication Sig Start Date End Date Taking? Authorizing Provider  albuterol (PROVENTIL) (2.5 MG/3ML) 0.083% nebulizer solution Take 2.5 mg by nebulization every 6 (six) hours as  needed for wheezing or shortness of breath.    Historical Provider, MD  amoxicillin-clavulanate (AUGMENTIN) 875-125 MG per tablet Take 1 tablet by mouth every 12 (twelve) hours. 01/20/14   Irven Shelling, MD  ergocalciferol (VITAMIN D2) 50000 UNITS capsule Take 50,000 Units by mouth every 14 (fourteen) days.     Historical Provider, MD  fluticasone (FLONASE) 50 MCG/ACT nasal spray Place 2 sprays into the nose daily as needed. For allergies     Historical Provider, MD  furosemide (LASIX) 20 MG tablet Take 1 tablet (20 mg total) by mouth daily. 11/13/13   Wenda Low, MD  magnesium hydroxide (MILK OF MAGNESIA) 400 MG/5ML suspension Take 30 mLs by mouth daily as needed for mild constipation. 11/16/13   Horton Finer, MD  mirtazapine (REMERON) 7.5 MG tablet Take 7.5 mg by mouth daily. 01/15/14   Historical Provider, MD   Multiple Vitamin (MULTIVITAMIN WITH MINERALS) TABS Take 1 tablet by mouth daily. 03/09/12   Wenda Low, MD  omeprazole (PRILOSEC) 20 MG capsule Take 20 mg by mouth daily.      Historical Provider, MD  polyethylene glycol (MIRALAX / GLYCOLAX) packet Take 17 g by mouth daily.    Historical Provider, MD  potassium chloride (K-DUR,KLOR-CON) 10 MEQ tablet Take 1 tablet (10 mEq total) by mouth daily. 11/13/13   Wenda Low, MD  pregabalin (LYRICA) 75 MG capsule Take 1 capsule (75 mg total) by mouth 2 (two) times daily. 03/10/12   Wenda Low, MD  sertraline (ZOLOFT) 50 MG tablet Take 1 tablet (50 mg total) by mouth daily. 11/13/13   Wenda Low, MD  tiotropium (SPIRIVA) 18 MCG inhalation capsule Place 18 mcg into inhaler and inhale daily.      Historical Provider, MD  triamcinolone cream (KENALOG) 0.1 % Apply 1 application topically 2 (two) times daily as needed. Swelling in right arm.    Historical Provider, MD  verapamil (CALAN-SR) 120 MG CR tablet Take 1 tablet (120 mg total) by mouth daily. 11/13/13   Wenda Low, MD   BP 110/61  Pulse 92  Temp(Src) 98.2 F (36.8 C) (Oral)  Ht 5\' 6"  (1.676 m)  Wt 135 lb 9.3 oz (61.5 kg)  BMI 21.89 kg/m2  SpO2 94% Physical Exam  Nursing note and vitals reviewed. Constitutional: She appears well-developed and well-nourished. No distress.  HENT:  Head: Normocephalic and atraumatic.  Mouth/Throat: No oropharyngeal exudate.  Eyes: EOM are normal. Pupils are equal, round, and reactive to light.  Neck: Neck supple.  Cardiovascular: Normal rate.  An irregular rhythm present.  Pulmonary/Chest: Effort normal and breath sounds normal. No respiratory distress. She has no wheezes. She has no rales.  Patient is able to speak in complete sentences.   Abdominal: Soft. She exhibits no distension. There is tenderness. There is no rebound and no guarding.  Right upper abdominal with biliary drain in place, greenish discharge in bag. Mild tenderness to palpation. No  obvious drainage. Minimal erythema from site.  Neurological: She is alert. She is disoriented.  Disoriented to date, month and day. Oriented to president. Decrease right upper extremity strength.  Right upper extremity decrease grip strength, ataxia on finger-nose finger. NO lower extremity weakness or decrease in sensation. Speech is clear, no facial droop.    Skin: Skin is warm and dry. She is not diaphoretic.  Psychiatric: She has a normal mood and affect. Her behavior is normal.    ED Course  Procedures (including critical care time) Labs Review Labs Reviewed - No data to  display  Results for orders placed during the hospital encounter of 02/02/14  CBC WITH DIFFERENTIAL      Result Value Ref Range   WBC 8.6  4.0 - 10.5 K/uL   RBC 3.43 (*) 3.87 - 5.11 MIL/uL   Hemoglobin 10.8 (*) 12.0 - 15.0 g/dL   HCT 35.3 (*) 36.0 - 46.0 %   MCV 102.9 (*) 78.0 - 100.0 fL   MCH 31.5  26.0 - 34.0 pg   MCHC 30.6  30.0 - 36.0 g/dL   RDW 14.1  11.5 - 15.5 %   Platelets 146 (*) 150 - 400 K/uL   Neutrophils Relative % 77  43 - 77 %   Neutro Abs 6.6  1.7 - 7.7 K/uL   Lymphocytes Relative 12  12 - 46 %   Lymphs Abs 1.0  0.7 - 4.0 K/uL   Monocytes Relative 11  3 - 12 %   Monocytes Absolute 0.9  0.1 - 1.0 K/uL   Eosinophils Relative 0  0 - 5 %   Eosinophils Absolute 0.0  0.0 - 0.7 K/uL   Basophils Relative 0  0 - 1 %   Basophils Absolute 0.0  0.0 - 0.1 K/uL  COMPREHENSIVE METABOLIC PANEL      Result Value Ref Range   Sodium 142  137 - 147 mEq/L   Potassium 4.2  3.7 - 5.3 mEq/L   Chloride 98  96 - 112 mEq/L   CO2 35 (*) 19 - 32 mEq/L   Glucose, Bld 94  70 - 99 mg/dL   BUN 15  6 - 23 mg/dL   Creatinine, Ser 0.44 (*) 0.50 - 1.10 mg/dL   Calcium 10.1  8.4 - 10.5 mg/dL   Total Protein 6.4  6.0 - 8.3 g/dL   Albumin 3.3 (*) 3.5 - 5.2 g/dL   AST 12  0 - 37 U/L   ALT 9  0 - 35 U/L   Alkaline Phosphatase 65  39 - 117 U/L   Total Bilirubin 0.8  0.3 - 1.2 mg/dL   GFR calc non Af Amer >90  >90  mL/min   GFR calc Af Amer >90  >90 mL/min   Anion gap 9  5 - 15  URINALYSIS, ROUTINE W REFLEX MICROSCOPIC      Result Value Ref Range   Color, Urine YELLOW  YELLOW   APPearance CLOUDY (*) CLEAR   Specific Gravity, Urine 1.016  1.005 - 1.030   pH 5.5  5.0 - 8.0   Glucose, UA NEGATIVE  NEGATIVE mg/dL   Hgb urine dipstick SMALL (*) NEGATIVE   Bilirubin Urine NEGATIVE  NEGATIVE   Ketones, ur NEGATIVE  NEGATIVE mg/dL   Protein, ur 30 (*) NEGATIVE mg/dL   Urobilinogen, UA 0.2  0.0 - 1.0 mg/dL   Nitrite POSITIVE (*) NEGATIVE   Leukocytes, UA LARGE (*) NEGATIVE  AMMONIA      Result Value Ref Range   Ammonia 29  11 - 60 umol/L  ETHANOL      Result Value Ref Range   Alcohol, Ethyl (B) <11  0 - 11 mg/dL  URINE MICROSCOPIC-ADD ON      Result Value Ref Range   Squamous Epithelial / LPF FEW (*) RARE   WBC, UA TOO NUMEROUS TO COUNT  <3 WBC/hpf   RBC / HPF 3-6  <3 RBC/hpf   Bacteria, UA MANY (*) RARE  I-STAT ARTERIAL BLOOD GAS, ED      Result Value Ref Range   pH, Arterial  7.374  7.350 - 7.450   pCO2 arterial 62.9 (*) 35.0 - 45.0 mmHg   pO2, Arterial 81.0  80.0 - 100.0 mmHg   Bicarbonate 36.7 (*) 20.0 - 24.0 mEq/L   TCO2 39  0 - 100 mmol/L   O2 Saturation 95.0     Acid-Base Excess 9.0 (*) 0.0 - 2.0 mmol/L   Patient temperature 98.6 F     Collection site RADIAL, ALLEN'S TEST ACCEPTABLE     Drawn by Operator     Sample type ARTERIAL     Comment NOTIFIED PHYSICIAN    I-STAT CG4 LACTIC ACID, ED      Result Value Ref Range   Lactic Acid, Venous 0.69  0.5 - 2.2 mmol/L   Dg Chest 2 View  02/02/2014   CLINICAL DATA:  Shortness some breath, confusion  EXAM: CHEST  2 VIEW  COMPARISON:  01/15/2014  FINDINGS: Cardiomediastinal silhouette is stable. No acute infiltrate or pleural effusion. No pulmonary edema. Mild degenerative changes thoracic spine. Stable right apical pleural parenchymal scarring.  IMPRESSION: No active cardiopulmonary disease.   Electronically Signed   By: Lahoma Crocker M.D.   On:  02/02/2014 11:47   Ct Head Wo Contrast  02/02/2014   CLINICAL DATA:  Shortness of breath, altered mental status  EXAM: CT HEAD WITHOUT CONTRAST  TECHNIQUE: Contiguous axial images were obtained from the base of the skull through the vertex without intravenous contrast.  COMPARISON:  01/15/2014,  FINDINGS: There is no evidence of mass effect, midline shift, or extra-axial fluid collections. There is no evidence of a space-occupying lesion or intracranial hemorrhage. There is no evidence of a cortical-based area of acute infarction. There is relative low attenuation in the left side of the pons concerning for an acute pontine infarct. There is generalized cerebral atrophy. There is periventricular white matter low attenuation likely secondary to microangiopathy.  The ventricles and sulci are appropriate for the patient's age. The basal cisterns are patent.  Visualized portions of the orbits are unremarkable. The visualized portions of the paranasal sinuses and mastoid air cells are unremarkable. Cerebrovascular atherosclerotic calcifications are noted.  The osseous structures are unremarkable.  IMPRESSION: 1. Relative low attenuation in the left side of the pons concerning for an acute pontine infarct.   Electronically Signed   By: Kathreen Devoid   On: 02/02/2014 12:56    Imaging Review No results found.  EKG Interpretation   Date/Time:  Tuesday February 02 2014 11:17:47 EDT Ventricular Rate:  74 PR Interval:  153 QRS Duration: 105 QT Interval:  403 QTC Calculation: 447 R Axis:   -60 Text Interpretation:  Sinus rhythm LAD, consider left anterior fascicular  block Abnormal R-wave progression, early transition LVH with secondary  repolarization abnormality When compared with ECG of 01/15/2014 No  significant change was found Confirmed by [facility]  MD, Nunzio Cory [phone])  on 02/02/2014 11:47:52 AM      MDM   Final diagnoses:  Left pontine CVA  Urinary tract infection without hematuria, site  unspecified  Altered mental status, unspecified altered mental status type   Patient presents from home to 2 altered mental status and dyspnea. Patient's oxygen saturation greater than 97% on 3L . Patient history dementia, decreased right grip strength, ataxia with finger-nose-finger. Unknown last time at baseline. CT and dyspnea workup ordered. ABG shows PCO2 62.9, normal 40's, bicarbonate, 36.7, patient compensating. Patient grandson at bedside states last known normal his 48, yesterday. He reports patient was slumped over in a chair and would not  awaken to voice. He also reports the patient removed her nasal cannula the previous night, for unknown time. States when he tried to wake up his grandmother nasal cannula was in place. CT shows questionable acute pontine infarct, discussed with neurology who will consult on patient, MRI ordered. UA shows infection, ceftriaxone ordered. Discussed patient history, condition with hospitalist who agrees to admission.  Harvie Heck, PA-C 02/02/14 1652

## 2014-02-02 NOTE — ED Notes (Signed)
Pt family at bedside states "the moments of confusion on today's date and year and other questions are normal for her to know one day and to be confused other days".  Pt is alert and oriented to her name, her date of birth and the hospital she is at.

## 2014-02-02 NOTE — ED Notes (Signed)
CT phoned, notified pt available for transport.

## 2014-02-02 NOTE — Progress Notes (Addendum)
Called by Prescott Parma at 2215  that pt is unarousable and hypotensive with BP 80/30.  Pt had been on bedpan to void at 2200, was taken off of bedpan and found unresponsive. On arrival to floor pt  responds to sternal rub, is diaphoretic with limited respiratory effort, clear diminished breath sounds throughout . RR 16 O2 sats 90% on 3 l [place].  Increased to 6 l with sats 92 %.Placed on NRB mask  With sats 100%, then  placed pt on ventimask at 50% by Caryl Pina, RT  Receiving NS 250 cc bolus per Dr Nicole Kindred.  12 lead EKG obtained  showing NSR rate 73 with Septal infarct, age undetermined. Unable to follow commands, PERL 42mm, and sluggish, no focal deficits from limited exam.  Pt had received MS CContin 15 mg. Ativan 0.5 mg, Lyrica 75 mg, Remeron 7.5 mg and Buspar 10 mg  as ordered at 2145.  Chaney Malling, NP informed by Caryl Pina, RN of pt's status . Per family friend at bedside and phone call to son pt had not been given MS Contin at home the past 3 days  because it made her too confused and sleepy.   Narcan .4 mg IV given at  2231  with pt becoming more alert.  Was able to state age correctly and follow simple commands.  Weaned back to 3 l [place] with O2 sats 98%. Received 2nd dosage of Narcan 0.4 at 2249 with little change.   VS at  2300:    98/37 71 SR 16 99% on 3 l [place]. Received  second bolus 250 cc NSS.  Remains very lethargic,responds to vigorous stimulation, states name and age correctly but will not follow commands.   MAE spontaneously.  Made NPO  Chaney Malling, NP at bedside at 2335.  Pt stuporous, responding to tactile stimulation. Decision made by Belenda Cruise, NP, staff RN, Merleen Nicely  and RRT that pt is  stable to stay on floor with sitter at bedside. Will continue q 2 hr VS  continuous pulse ox. Hand off to [place], RN with instructions to contact RRT for any concerns.  Will follow as needed.  0200 F/U :  Pt drowsy, sitting on bedpan, talking to sitter.   FC, MAE  VS:  116/49  82 SR,  18 98% on 3 l [place].  Receiving NS  at 75 cc/hr. Will continue to follow as needed.  0500 Called by Merleen Nicely, RN with ABG results:  7.31 PH    70 PCO2   82.8 PO2    Bicarb 34.4  Markedly improved neuro status.    Pt alert, oriented, watching TV,  Bilat BS clear, diminished in Bilat bases, coughing clear sputum.  RR 20   96% 3 LNC   115/47   84 SR  Requested staff to call for any changes in pt status.

## 2014-02-02 NOTE — Progress Notes (Signed)
Called for Rapid Response for pt going unresponsive. Arrived with pt on NRB sats 100%. Clear/diminished breath sounds. Pt weaned to 4L [place] sats 99% at 2250. RT will continue to monitor.

## 2014-02-02 NOTE — ED Notes (Signed)
PA at bedside.    Grandson at bedside states right sided weakness to the RUE is not a new occurrence.  Pt states LKN is 8:00pm yesterday.

## 2014-02-03 DIAGNOSIS — K219 Gastro-esophageal reflux disease without esophagitis: Secondary | ICD-10-CM

## 2014-02-03 DIAGNOSIS — IMO0001 Reserved for inherently not codable concepts without codable children: Secondary | ICD-10-CM

## 2014-02-03 DIAGNOSIS — F411 Generalized anxiety disorder: Secondary | ICD-10-CM

## 2014-02-03 DIAGNOSIS — J440 Chronic obstructive pulmonary disease with acute lower respiratory infection: Secondary | ICD-10-CM

## 2014-02-03 DIAGNOSIS — R7989 Other specified abnormal findings of blood chemistry: Secondary | ICD-10-CM

## 2014-02-03 DIAGNOSIS — I359 Nonrheumatic aortic valve disorder, unspecified: Secondary | ICD-10-CM

## 2014-02-03 DIAGNOSIS — G9340 Encephalopathy, unspecified: Secondary | ICD-10-CM

## 2014-02-03 DIAGNOSIS — R4182 Altered mental status, unspecified: Secondary | ICD-10-CM

## 2014-02-03 LAB — HEMOGLOBIN A1C
Hgb A1c MFr Bld: 5.1 % (ref <5.7)
MEAN PLASMA GLUCOSE: 100 mg/dL (ref <117)

## 2014-02-03 LAB — CBC
HCT: 34.3 % — ABNORMAL LOW (ref 36.0–46.0)
HEMOGLOBIN: 10.5 g/dL — AB (ref 12.0–15.0)
MCH: 31.3 pg (ref 26.0–34.0)
MCHC: 30.6 g/dL (ref 30.0–36.0)
MCV: 102.4 fL — ABNORMAL HIGH (ref 78.0–100.0)
Platelets: 110 10*3/uL — ABNORMAL LOW (ref 150–400)
RBC: 3.35 MIL/uL — AB (ref 3.87–5.11)
RDW: 14 % (ref 11.5–15.5)
WBC: 8 10*3/uL (ref 4.0–10.5)

## 2014-02-03 LAB — BLOOD GAS, ARTERIAL
Acid-Base Excess: 8.2 mmol/L — ABNORMAL HIGH (ref 0.0–2.0)
BICARBONATE: 34.4 meq/L — AB (ref 20.0–24.0)
Drawn by: 39866
O2 Content: 4 L/min
O2 SAT: 95.1 %
PATIENT TEMPERATURE: 98.4
TCO2: 36.6 mmol/L (ref 0–100)
pCO2 arterial: 70 mmHg (ref 35.0–45.0)
pH, Arterial: 7.312 — ABNORMAL LOW (ref 7.350–7.450)
pO2, Arterial: 82.8 mmHg (ref 80.0–100.0)

## 2014-02-03 LAB — BASIC METABOLIC PANEL
Anion gap: 10 (ref 5–15)
BUN: 12 mg/dL (ref 6–23)
CO2: 30 meq/L (ref 19–32)
Calcium: 9.5 mg/dL (ref 8.4–10.5)
Chloride: 102 mEq/L (ref 96–112)
Creatinine, Ser: 0.4 mg/dL — ABNORMAL LOW (ref 0.50–1.10)
GFR calc Af Amer: >90 mL/min (ref >90)
GLUCOSE: 95 mg/dL (ref 70–99)
POTASSIUM: 4.2 meq/L (ref 3.7–5.3)
Sodium: 142 mEq/L (ref 137–147)

## 2014-02-03 LAB — LIPID PANEL
CHOL/HDL RATIO: 1.9 ratio
CHOLESTEROL: 141 mg/dL (ref 0–200)
HDL: 75 mg/dL (ref >39)
LDL Cholesterol: 51 mg/dL (ref 0–99)
Triglycerides: 77 mg/dL (ref <150)
VLDL: 15 mg/dL (ref 0–40)

## 2014-02-03 LAB — TROPONIN I
Troponin I: <0.3 ng/mL (ref <0.30)
Troponin I: <0.3 ng/mL (ref <0.30)

## 2014-02-03 MED ORDER — SODIUM CHLORIDE 0.9 % IV BOLUS (SEPSIS)
500.0000 mL | Freq: Once | INTRAVENOUS | Status: AC
  Administered 2014-02-03: 500 mL via INTRAVENOUS

## 2014-02-03 MED ORDER — LORAZEPAM 2 MG/ML IJ SOLN
0.5000 mg | Freq: Once | INTRAMUSCULAR | Status: AC
  Administered 2014-02-03: 0.5 mg via INTRAVENOUS

## 2014-02-03 MED ORDER — LORAZEPAM 2 MG/ML IJ SOLN
INTRAMUSCULAR | Status: AC
  Filled 2014-02-03: qty 1

## 2014-02-03 MED ORDER — HALOPERIDOL LACTATE 5 MG/ML IJ SOLN: 0.5000 mg | Freq: Once | INTRAMUSCULAR | Status: DC

## 2014-02-03 MED ORDER — TIZANIDINE HCL 2 MG PO TABS
2.0000 mg | ORAL_TABLET | Freq: Every day | ORAL | Status: DC
  Administered 2014-02-04: 2 mg via ORAL
  Filled 2014-02-03 (×2): qty 1

## 2014-02-03 MED ORDER — HALOPERIDOL LACTATE 5 MG/ML IJ SOLN: 0.5000 mg | Freq: Once | INTRAMUSCULAR | Status: DC | PRN

## 2014-02-03 NOTE — Evaluation (Signed)
Occupational Therapy Evaluation Patient Details Name: Theresa Blackwell MRN: [id number] DOB: [birth year] Today's Date: 02/03/2014    History of Present Illness Theresa Blackwell is a 80 y.o. female multiple medical problems including history of acute cholecystitis status post cholecystostomy tube, COPD/chronic respiratory failure -home O2-dependent on 3 L, chronic pain on multiple narcotics   Clinical Impression   Patient admitted with above. Patient supervision>occasional min assist PTA. Feel patient will benefit from acute OT to enhance overall performance in the areas of ADLs, dynamic standing balance/tolerance/endurance for functional mobility & transfers, overall activity tolerance/endurance, education to patient and family regarding safety at home. Patient's son present during OT acute eval and stated that patient has had 24/7 supervision/assist at home for ~1year prior to this hospital admittance.  Patient with baseline dementia and disoriented to birth date during OT eval. Patient's 02sats at 91% on 3 liters of 02 via [place]. No signs of distress during session. Patient with increased aggravation, thinking this may be baseline. However, patient distracted by anything on or around hands.      Follow Up Recommendations  No OT follow up (Patient has 24/7 supervision at discharge)    Equipment Recommendations  Tub/shower seat    Recommendations for Other Services  n/a at this time     Precautions / Restrictions Precautions Precautions: Fall Precaution Comments: patient with history of dementia and confused during OT eval Restrictions Weight Bearing Restrictions: No      Mobility Bed Mobility Overal bed mobility: Needs Assistance Bed Mobility: Rolling;Sidelying to Sit;Supine to Sit;Sit to Supine Rolling: Min guard Sidelying to sit: Min guard Supine to sit: Min guard Sit to supine: Mod assist   General bed mobility comments: Patient's son stated he normally assists with bed mobility prn  at home  Transfers Overall transfer level: Needs assistance Equipment used: 1 person hand held assist Transfers: Sit to/from Stand Sit to Stand: Min assist              Balance Overall balance assessment: Needs assistance         Standing balance support: No upper extremity supported Standing balance-Leahy Scale: Fair                              ADL Overall ADL's : Needs assistance/impaired;At baseline                                                       Pertinent Vitals/Pain Pain Assessment:  (no signs of distress)     Hand Dominance Right   Extremity/Trunk Assessment Upper Extremity Assessment Upper Extremity Assessment: Overall WFL for tasks assessed;Generalized weakness;RUE deficits/detail RUE Deficits / Details: lymphedema with compression garment in place RUE:  (Patient with general weakness, but overall WFL) RUE Coordination: decreased fine motor   Lower Extremity Assessment Lower Extremity Assessment: Defer to PT evaluation   Cervical / Trunk Assessment Cervical / Trunk Assessment: Kyphotic   Communication Communication Communication: No difficulties (patient with basline dementia)   Cognition Arousal/Alertness: Awake/alert Behavior During Therapy: WFL for tasks assessed/performed;Anxious;Impulsive Overall Cognitive Status: History of cognitive impairments - at baseline Area of Impairment: Orientation;Following commands;Awareness;Problem solving;Memory Orientation Level: Disoriented to;Person (patient unable to state year of birth, even with total cues)     Following Commands: Follows multi-step commands inconsistently Safety/Judgement:  Decreased awareness of safety   Problem Solving: Decreased initiation;Requires verbal cues General Comments: Patient presents easily distracted by internal/external stimuli              [place] expects to be discharged to:: Private residence Living  Arrangements: Spouse/significant other;Children Available Help at Discharge: Family;Available 24 hours/day;Friend(s) Type of Home: House Home Access: Stairs to enter [employer] of Steps: 3-4 per patient report Entrance Stairs-Rails: Can reach both Home Layout: One level     Bathroom Shower/Tub: Occupational psychologist: Standard Bathroom Accessibility: Yes How Accessible: Accessible via walker Home Equipment: Bedside commode   Additional Comments: Patient with 24/7 supervision PTA for ~1 yr now.       Prior Functioning/Environment Level of Independence: Needs assistance  Gait / Transfers Assistance Needed: Patient's son stated she was ambulting with cane more than RW recently ADL's / Homemaking Assistance Needed: Patient needs assistance with IADL tasks        OT Diagnosis: Generalized weakness;Altered mental status   OT Problem List: Decreased strength;Decreased activity tolerance;Impaired balance (sitting and/or standing);Decreased safety awareness;Decreased cognition   OT Treatment/Interventions: Self-care/ADL training;Therapeutic exercise;Energy conservation;Therapeutic activities;Balance training;Patient/family education    OT Goals(Current goals can be found in the care plan section) Acute Rehab OT Goals Patient Stated Goal: none stated OT Goal Formulation: With patient/family Time For Goal Achievement: 02/10/14 Potential to Achieve Goals: Good ADL Goals Pt Will Perform Grooming: with supervision;standing Pt Will Perform Upper Body Bathing: with supervision;sitting Pt Will Perform Lower Body Bathing: with supervision;sit to/from stand Pt Will Perform Upper Body Dressing: with supervision;sitting Pt Will Perform Lower Body Dressing: with supervision;sit to/from stand Pt Will Transfer to Toilet: with supervision;bedside commode Pt Will Perform Toileting - Clothing Manipulation and hygiene: with supervision;sit to/from stand Pt Will Perform  Tub/Shower Transfer: with supervision;Shower transfer;ambulating;shower seat (DME prn)  OT Frequency: Min 2X/week   Barriers to D/C:  (none known at this time)   (none known at this time)       Co-evaluation              End of Session Equipment Utilized During Treatment: Gait belt Nurse Communication:  (Notifed RN of son's request for 24/7 sitter)  Activity Tolerance: Treatment limited secondary to agitation Patient left: in bed;with call bell/phone within reach;with family/visitor present   Time: 3875-6433 OT Time Calculation (min): 38 min Charges:  OT General Charges $OT Visit: 1 Procedure OT Evaluation $Initial OT Evaluation Tier I: 1 Procedure OT Treatments $Self Care/Home Management : 23-37 mins G-Codes:    Garrus Gauthreaux, MS, OTR/L, CLT 02/03/2014, 10:24 AM

## 2014-02-03 NOTE — Progress Notes (Signed)
*  PRELIMINARY RESULTS* Vascular Ultrasound Carotid Duplex (Doppler) has been completed. Findings suggest 1-39% internal carotid artery stenosis bilaterally. Vertebral arteries are patent with antegrade flow.  02/03/2014 11:42 AM Maudry Mayhew, RVT, RDCS, RDMS

## 2014-02-03 NOTE — Progress Notes (Signed)
[facility]                                                                              Patient Demographics  Theresa Blackwell, is a 80 y.o. female, DOB - 09-07-1930, FOY:[id number]  Admit date - 02/02/2014   Admitting Physician Adeline Saralyn Pilar, MD  Outpatient Primary MD for the patient is Wenda Low, MD  LOS - 1   Chief Complaint  Patient presents with  . Shortness of Breath  . Altered Mental Status      HPI on 02/02/2014 Theresa Blackwell is a 80 y.o. female multiple medical problems including history of acute cholecystitis status post cholecystostomy tube, COPD/chronic respiratory failure -home O2-dependent on 3 L, chronic pain on multiple narcotics, who presented with altered mental status and shortness of breath. She was treated with nebulizer treatments and her oxygenation improved on nasal cannula O2 per ED physician. Urinalysis was consistent with a UTI. In ED, patient was noted to have right upper extremity weakness, a CT scan of her brain was done and it showed low attenuation in the left side of pons concerning for an acute pontine infarct. Neurology was consulted, and admission to [facility] requested for further evaluation and management.  In ED patient was started on empiric antibiotics for UTI.  Upon admission, patient denied cough, chest pain. She was on nasal cannula O2, and denied shortness of breath.  Assessment & Plan   Acute encephalopathy, TIA versus CVA -Possibly also related to narcotics and multiple medications -Patient appears read back at baseline, was given Narcan overnight  -CT head: Relative low attenuation in the left side of the pons concerning  for an acute pontine infarct. -MRI brain:No evidence of acute intracranial abnormality -Neurology consulted and appreicated -Carotid doppler: 1-39% internal carotid artery stenosis bilaterally. Vertebral arteries are patent with antegrade flow. -Echocardiogram: pending -LDL 51, hemoglobin A1c pending -Pending PT  and OT evaluations -Continue aspirin for secondary stroke prevention  Urinary tract infection -UA: TNTC WBC, positive nitrites, large leukocytes, many bacteria -Urine cultures pending -Continue ceftriaxone  Chronic diastolic heart failure -Currently compensated -Continue to monitor daily weights, intake and output, Lasix  Paroxysmal atrial fibrillation -Currently in sinus rhythm -Continue verapamil and aspirin  Acute on chronic respiratory failure/COPD with hypercapnia -Improved, likely secondary to narcotics -Patient uses oxygen at home, will continue to maintain her saturations above 92% -Continue nebulizer treatments -Chest x-ray shows no acute infiltrate  History of acute cholecystitis status post colostomy tube -LFTs within normal limits, tube appears to be draining well -Dr.Tsuei's note of 8/10 surgery was being considered -Will follow up with surgery  Fibromyalgia/chronic pain -Patient on multiple medications -Continue Zanaflex, Lyrica -Patient had been given Narcan -Will hold narcotics. -Patient will need to speak with her primary care physician regarding her polypharmacy  Depression with anxiety -Continue BuSpar, Ativan, Remeron, Zoloft  GERD -Continue PPI  Chronic right upper extremity edema/lymphedema -Currently has sleeve in place  Code Status: Full  Family Communication: None at bedside  Disposition Plan: Admitted, pending Neurology recommendations and CVA workup  Time Spent in minutes   30 minutes  Procedures  Carotid doppler: 1-39% internal carotid artery stenosis bilaterally. Vertebral arteries are patent with antegrade  flow.  Echocardiogram pending  Consults   Neurology  DVT Prophylaxis  Lovenox  Lab Results  Component Value Date   PLT 110* 02/03/2014    Medications  Scheduled Meds: . aspirin  300 mg Rectal Daily   Or  . aspirin  325 mg Oral Daily  . busPIRone  10 mg Oral TID  . cefTRIAXone (ROCEPHIN) IVPB 1 gram/50 mL D5W  1 g  Intravenous Q24H  . enoxaparin (LOVENOX) injection  40 mg Subcutaneous Q24H  . furosemide  40 mg Oral BID  . LORazepam  0.5 mg Oral QHS  . mirtazapine  7.5 mg Oral QHS  . multivitamin with minerals  1 tablet Oral Daily  . pantoprazole  40 mg Oral Daily  . polyethylene glycol  17 g Oral Daily  . potassium chloride SA  20 mEq Oral BID  . pregabalin  75 mg Oral BID  . sertraline  50 mg Oral Daily  . thiamine  100 mg Oral Daily  . tiotropium  18 mcg Inhalation Daily  . tiZANidine  4 mg Oral QHS  . verapamil  120 mg Oral Daily   Continuous Infusions:  PRN Meds:.acetaminophen, acetaminophen, albuterol, alum & mag hydroxide-simeth, HYDROcodone-acetaminophen, naLOXone (NARCAN)  injection, naLOXone (NARCAN)  injection, ondansetron, senna-docusate, triamcinolone cream  Antibiotics    Anti-infectives   Start     Dose/Rate Route Frequency Ordered Stop   02/03/14 1400  cefTRIAXone (ROCEPHIN) 1 g in dextrose 5 % 50 mL IVPB     1 g 100 mL/hr over 30 Minutes Intravenous Every 24 hours 02/02/14 1854     02/02/14 1300  cefTRIAXone (ROCEPHIN) 1 g in dextrose 5 % 50 mL IVPB     1 g 100 mL/hr over 30 Minutes Intravenous  Once 02/02/14 1245 02/02/14 1450        Subjective:   Theresa Blackwell seen and examined today. Patient states she does not feel well today. She has no specific complaints. Patient states her shortness of breath has somewhat improved. She denies any cough at this time. Patient denies any dizziness, headache, numbness, chest pain, abdominal pain.  Objective:   Filed Vitals:   02/03/14 0600 02/03/14 1038 02/03/14 1045 02/03/14 1114  BP: 102/40 103/51 120/54 106/35  Pulse: 55 56  87  Temp:    98.8 F (37.1 C)  TempSrc:    Oral  Resp: 18   24  Height:      Weight:      SpO2: 96% 98%  99%    Wt Readings from Last 3 Encounters:  02/03/14 59 kg (130 lb 1.1 oz)  01/19/14 61.499 kg (135 lb 9.3 oz)  01/04/14 57.97 kg (127 lb 12.8 oz)     Intake/Output Summary (Last 24 hours)  at 02/03/14 1214 Last data filed at 02/03/14 1011  Gross per 24 hour  Intake      0 ml  Output    750 ml  Net   -750 ml    Exam  General: Well developed, well nourished, NAD, appears stated age  HEENT: NCAT, PERRLA, EOMI, Anicteic Sclera, mucous membranes moist.   Neck: Supple, no JVD, no masses  Cardiovascular: S1 S2 auscultated, no rubs, murmurs or gallops. Regular rate and rhythm.  Respiratory: Clear to auscultation bilaterally with equal chest rise  Abdomen: Soft, nontender, nondistended, + bowel sounds  Extremities: warm dry without cyanosis clubbing or edema  Neuro: AAO to person, place, time (except year), cranial nerves grossly intact. Strength equal and bilateral in upper/lower ext.  Skin: Without rashes exudates or nodules  Psych: Normal affect and demeanor with intact judgement and insight  Data Review   Micro Results Recent Results (from the past 240 hour(s))  CULTURE, BLOOD (ROUTINE X 2)     Status: None   Collection Time    02/02/14  2:10 PM      Result Value Ref Range Status   Specimen Description BLOOD LEFT ANTECUBITAL   Final   Special Requests BOTTLES DRAWN AEROBIC AND ANAEROBIC 10MLS   Final   Culture  Setup Time     Final   Value: 02/02/2014 18:51     Performed at [facility]   Culture     Final   Value: GRAM VARIABLE ROD     Note: Gram Stain Report Called to,Read Back By and Verified With: JULIA HURRELBRINK@0948  ON [id number] BY [facility]     Performed at [facility]   Report Status PENDING   Incomplete  CULTURE, BLOOD (ROUTINE X 2)     Status: None   Collection Time    02/02/14  2:25 PM      Result Value Ref Range Status   Specimen Description BLOOD LEFT ARM   Final   Special Requests BOTTLES DRAWN AEROBIC AND ANAEROBIC 10MLS   Final   Culture  Setup Time     Final   Value: 02/02/2014 18:49     Performed at [facility]   Culture     Final   Value: GRAM VARIABLE ROD     Note: Gram Stain Report Called to,Read Back By  and Verified With: JULIA HURRELBRINK@0948  ON [id number] BY [facility]     Performed at [facility]   Report Status PENDING   Incomplete    Radiology Reports Dg Chest 2 View  02/02/2014   CLINICAL DATA:  Shortness some breath, confusion  EXAM: CHEST  2 VIEW  COMPARISON:  01/15/2014  FINDINGS: Cardiomediastinal silhouette is stable. No acute infiltrate or pleural effusion. No pulmonary edema. Mild degenerative changes thoracic spine. Stable right apical pleural parenchymal scarring.  IMPRESSION: No active cardiopulmonary disease.   Electronically Signed   By: Lahoma Crocker M.D.   On: 02/02/2014 11:47   Ct Head Wo Contrast  02/02/2014   CLINICAL DATA:  Shortness of breath, altered mental status  EXAM: CT HEAD WITHOUT CONTRAST  TECHNIQUE: Contiguous axial images were obtained from the base of the skull through the vertex without intravenous contrast.  COMPARISON:  01/15/2014,  FINDINGS: There is no evidence of mass effect, midline shift, or extra-axial fluid collections. There is no evidence of a space-occupying lesion or intracranial hemorrhage. There is no evidence of a cortical-based area of acute infarction. There is relative low attenuation in the left side of the pons concerning for an acute pontine infarct. There is generalized cerebral atrophy. There is periventricular white matter low attenuation likely secondary to microangiopathy.  The ventricles and sulci are appropriate for the patient's age. The basal cisterns are patent.  Visualized portions of the orbits are unremarkable. The visualized portions of the paranasal sinuses and mastoid air cells are unremarkable. Cerebrovascular atherosclerotic calcifications are noted.  The osseous structures are unremarkable.  IMPRESSION: 1. Relative low attenuation in the left side of the pons concerning for an acute pontine infarct.   Electronically Signed   By: Kathreen Devoid   On: 02/02/2014 12:56   Ct Head Wo Contrast  01/15/2014   CLINICAL DATA:  Altered  mental status. ; history of CHF, breast malignancy,  and COPD  EXAM: CT HEAD WITHOUT CONTRAST  TECHNIQUE: Contiguous axial images were obtained from the base of the skull through the vertex without intravenous contrast.  COMPARISON:  Sinus CT scan dated May 31, 2003  FINDINGS: There is mild age appropriate diffuse cerebral atrophy with compensatory ventriculomegaly. There is symmetric decreased density in the deep white matter of both cerebral hemispheres consistent with chronic small vessel ischemia. There is no acute intracranial hemorrhage nor evidence of an acute ischemic event. The cerebellum and brainstem are normal.  There is fluid in the right maxillary sinus. The observed bony margins of the sinus are normal. Fluid was present bilaterally in the maxillary sinuses on the previous CT scan. The other paranasal sinuses are clear. The mastoid air cells are well pneumatized. There is no skull fracture.  IMPRESSION: 1. There is no acute ischemic or hemorrhagic event. 2. There are age related changes of chronic small vessel ischemia and acute atrophy. 3. Fluid in the right maxillary sinus in the absence of facial trauma is consistent with acute sinusitis. This may impact the patient's mental status. 4. There is no acute skull fracture.   Electronically Signed   By: David  Martinique   On: 01/15/2014 14:30   Mr Brain Wo Contrast  02/02/2014   CLINICAL DATA:  Altered mental status. Possible pontine infarct on head CT.  EXAM: MRI HEAD WITHOUT CONTRAST  MRA HEAD WITHOUT CONTRAST  TECHNIQUE: Multiplanar, multiecho pulse sequences of the brain and surrounding structures were obtained without intravenous contrast. Angiographic images of the head were obtained using MRA technique without contrast.  COMPARISON:  Head CT 02/02/2014  FINDINGS: MRI HEAD FINDINGS  Images are mildly to moderately degraded by motion. There is no evidence of acute infarct, intracranial hemorrhage, mass, midline shift, or extra-axial fluid  collection. Patchy and confluent T2 hyperintensities in the subcortical and deep cerebral white matter and pons are nonspecific but compatible with moderate chronic small vessel ischemic disease. A small, remote infarct is noted in the left centrum semiovale. There is mild, age-appropriate cerebral atrophy.  Prior bilateral cataract extraction is noted. There is a small left mastoid effusion. Major intracranial vascular flow voids are grossly preserved.  MRA HEAD FINDINGS  Images are moderately to severely degraded by motion. Visualized distal vertebral arteries are patent and codominant. PICA origins are patent. Left AICA appears dominant. SCA origins are patent. Basilar artery is patent without stenosis. There is a small right posterior communicating artery. Right P1 segment appears hypoplastic. Proximal PCAs are grossly unremarkable, with limited branch vessel evaluation due to medication.  Internal carotid arteries are patent from skullbase to carotid termini. Proximal MCAs are patent without evidence of significant stenosis. M2 and more distal MCA branch vessels are not well evaluated due to motion. Left A1 segment is unremarkable. Right A1 is not well evaluated due to motion. A2 segments appear patent bilaterally, with detailed evaluation limited by motion.  IMPRESSION: 1. No evidence of acute intracranial abnormality. 2. Moderate chronic small vessel ischemic disease in the deep cerebral white matter and pons. 3. Limited head MRA due to motion. No evidence of major intracranial arterial occlusion.   Electronically Signed   By: Logan Bores   On: 02/02/2014 19:53   Dg Chest Port 1 View  01/15/2014   CLINICAL DATA:  Acute mental status changes.  Long-time smoker.  EXAM: PORTABLE CHEST - 1 VIEW  COMPARISON:  Two-view chest x-ray 11/11/2013, 02/02/2012, 05/30/2005. Portable chest x-ray 03/05/2012. CTA chest 06/05/2011.  FINDINGS: Patient rotated to  the right. Cardiac silhouette mildly enlarged but stable.  Thoracic aorta tortuous and atherosclerotic, unchanged. Right apical pleuroparenchymal scarring, unchanged when accounting for differences in technique. Lungs otherwise clear. No pneumothorax. Pulmonary vascularity normal. Costophrenic angles excluded from the image.  IMPRESSION: No acute cardiopulmonary disease. Stable right apical pleuroparenchymal scarring.   Electronically Signed   By: Evangeline Dakin M.D.   On: 01/15/2014 13:55   Mr Jodene Nam Head/brain Wo Cm  02/02/2014   CLINICAL DATA:  Altered mental status. Possible pontine infarct on head CT.  EXAM: MRI HEAD WITHOUT CONTRAST  MRA HEAD WITHOUT CONTRAST  TECHNIQUE: Multiplanar, multiecho pulse sequences of the brain and surrounding structures were obtained without intravenous contrast. Angiographic images of the head were obtained using MRA technique without contrast.  COMPARISON:  Head CT 02/02/2014  FINDINGS: MRI HEAD FINDINGS  Images are mildly to moderately degraded by motion. There is no evidence of acute infarct, intracranial hemorrhage, mass, midline shift, or extra-axial fluid collection. Patchy and confluent T2 hyperintensities in the subcortical and deep cerebral white matter and pons are nonspecific but compatible with moderate chronic small vessel ischemic disease. A small, remote infarct is noted in the left centrum semiovale. There is mild, age-appropriate cerebral atrophy.  Prior bilateral cataract extraction is noted. There is a small left mastoid effusion. Major intracranial vascular flow voids are grossly preserved.  MRA HEAD FINDINGS  Images are moderately to severely degraded by motion. Visualized distal vertebral arteries are patent and codominant. PICA origins are patent. Left AICA appears dominant. SCA origins are patent. Basilar artery is patent without stenosis. There is a small right posterior communicating artery. Right P1 segment appears hypoplastic. Proximal PCAs are grossly unremarkable, with limited branch vessel evaluation due  to medication.  Internal carotid arteries are patent from skullbase to carotid termini. Proximal MCAs are patent without evidence of significant stenosis. M2 and more distal MCA branch vessels are not well evaluated due to motion. Left A1 segment is unremarkable. Right A1 is not well evaluated due to motion. A2 segments appear patent bilaterally, with detailed evaluation limited by motion.  IMPRESSION: 1. No evidence of acute intracranial abnormality. 2. Moderate chronic small vessel ischemic disease in the deep cerebral white matter and pons. 3. Limited head MRA due to motion. No evidence of major intracranial arterial occlusion.   Electronically Signed   By: Logan Bores   On: 02/02/2014 19:53    CBC  Recent Labs Lab 02/02/14 1045 02/03/14 0509  WBC 8.6 8.0  HGB 10.8* 10.5*  HCT 35.3* 34.3*  PLT 146* 110*  MCV 102.9* 102.4*  MCH 31.5 31.3  MCHC 30.6 30.6  RDW 14.1 14.0  LYMPHSABS 1.0  --   MONOABS 0.9  --   EOSABS 0.0  --   BASOSABS 0.0  --     Chemistries   Recent Labs Lab 02/02/14 1045 02/03/14 0509  NA 142 142  K 4.2 4.2  CL 98 102  CO2 35* 30  GLUCOSE 94 95  BUN 15 12  CREATININE 0.44* 0.40*  CALCIUM 10.1 9.5  AST 12  --   ALT 9  --   ALKPHOS 65  --   BILITOT 0.8  --    ------------------------------------------------------------------------------------------------------------------ estimated creatinine clearance is 51.4 ml/min (by C-G formula based on Cr of 0.4). ------------------------------------------------------------------------------------------------------------------ No results found for this basename: HGBA1C,  in the last 72 hours ------------------------------------------------------------------------------------------------------------------  Recent Labs  02/03/14 0509  CHOL 141  HDL 75  LDLCALC 51  TRIG 77  CHOLHDL 1.9   ------------------------------------------------------------------------------------------------------------------  No  results found for this basename: TSH, T4TOTAL, FREET3, T3FREE, THYROIDAB,  in the last 72 hours ------------------------------------------------------------------------------------------------------------------ No results found for this basename: VITAMINB12, FOLATE, FERRITIN, TIBC, IRON, RETICCTPCT,  in the last 72 hours  Coagulation profile No results found for this basename: INR, PROTIME,  in the last 168 hours  No results found for this basename: DDIMER,  in the last 72 hours  Cardiac Enzymes  Recent Labs Lab 02/03/14 02/03/14 0509 02/03/14 0955  TROPONINI <0.30 <0.30 <0.30   ------------------------------------------------------------------------------------------------------------------ No components found with this basename: POCBNP,     Honorio Devol D.O. on 02/03/2014 at 12:14 PM  Between 7am to 7pm - Pager - [phone]  After 7pm go to [email or web address] - password [email or web address]  And look for the night coverage person covering for me after hours  [facility] Office  [phone]

## 2014-02-03 NOTE — Progress Notes (Signed)
Echo Lab  2D Echocardiogram completed.  [place], [place] 02/03/2014 2:47 PM

## 2014-02-03 NOTE — Progress Notes (Signed)
Pt's HR observed to be in the 140s at shift change and in Afib, pt denies any discomfort, visiting with family in room, NP Forrest Moron (on call) paged and notified, ordered to get an EKG same done read AFIB RVR with a rate of 145 same related back to [place] but pt converted back to sinus rythym with a rate of 80-90s  about 2020, quiet and sleeping in bed,will however continue to monitor.Theresa Blackwell, Theresa Blackwell

## 2014-02-03 NOTE — Progress Notes (Signed)
MD made aware of pt's wishes regarding a sitter.

## 2014-02-03 NOTE — Progress Notes (Signed)
Event: Notified by RN that pt unresponsive and hypotensive (82/40). She has very shallow respirations and sats are 90% on 3L [place] prior to being switched to NRB. Sats currently 100%. RR RN has been paged and is currently at bedside. Pt received multiple sedating medications at approx 2130 and symptoms noted after approx 45 min. Narcan 0.4 mg given IV and pt noted to respond and become much more awake and responsive though is somnolent if not stimulated. Narcan 0.4 mg IV ordered to be repeated and to start 250cc NS IVF bolus. NP to bedside. Subjective: Pt unable to provide information given decreased LOC.  Objective: Ms Folger is an 80 y.o. female w/ multiple medical problems including history of acute cholecystitis status post cholecystostomy tube, COPD/chronic respiratory failure -home O2-dependent on 3 L, chronic pain on multiple narcotics who presented to ED on 02/02/14 with AMS and SOB. She was treated with nebs and her 02 sats improved on O2 via [place]. U/a was c/w  UTI and patient was started on empiric antibiotics. In ED patient was also noted to have RUE weakness. CT scan of her brain was done and showed low attenuation in the left side of the pons concerning for an acute pontine infarct. Neuro was consulted and admission to [facility] requested for further evaluation and management. At bedside pt noted to be very somnolent. She will respond to sternal rub and clearly MAE's x 4. Pupils are 1-2 and sluggish. No obvious facial droop. Respirations now normal w/ 02 sats of 98% on 3L [place]. BP has improved to 103/55 after IVF bolus. RN reports pt received multiple sedating medications prior to onset of symptoms including MS Contin 15 mg, Ativan 0.5 mg, Lyrica 75 mg, Remeron 7.5 mg and Buspar 10 mg. Family friend at bedside reports that family stopped giving her the MS Contin 3-4 days ago at home because after approx 3 days of taking it she became difficult to wake up. Pt is also on Fentanyl patch at home but it is unclear  when she had one last. There is no Fentanyl patch on pt at this time. MRI from this evening was negative for stroke.  Assessment/Plan: 1. Decreased LOC/Somnolence: Given exam and response to Narcan felt most likely related to multiple sedating medications given at same time an hour prior to onset of sx's.  Pt has apparently been off MS Contin at home d/t increased somnolence after several doses. It is unclear when pt was last on Fentanyl patch. I have d/c'd MS Contin and AM dose of Ativan. VS have normalized and pt is consistently responsive to stimulation though falls back asleep quickly. Narcan qtt considered but will hold for now. Will leave pt on floor w/ q1h VS and q2h neuro checks. Low threshold to transfer to SDU if pt continues to deteriorate.  Will defer further changes in pt's medications and plan to rounding MD.   Jeryl Columbia, NP-C [facility] Pager [phone]

## 2014-02-03 NOTE — Progress Notes (Signed)
UR complete.  Trennon Torbeck RN, MSN 

## 2014-02-03 NOTE — Progress Notes (Signed)
INITIAL NUTRITION ASSESSMENT  DOCUMENTATION CODES Per approved criteria  -Not Applicable   INTERVENTION: Diet advancement per SLP/MD RD to continue to monitor  NUTRITION DIAGNOSIS: Inadequate oral intake related to recent stroke as evidenced by current NPO status.   Goal: Pt to meet >/= 90% of their estimated nutrition needs   Monitor:  Diet advancement, PO intake/tolerance, weight trend, lasb  Reason for Assessment: Malnutrition Screening Tool  80 y.o. female  Admitting Dx: <principal problem not specified>  ASSESSMENT: 80 y.o. female multiple medical problems including history of acute cholecystitis status post cholecystostomy tube, COPD/chronic respiratory failure -home O2-dependent on 3 L, chronic pain on multiple narcotics, who presented with altered mental status and shortness of breath. Urinalysis was consistent with a UTI. In ED, patient was noted to have right upper extremity weakness, a CT scan of her brain was done and it showed low attenuation in the left side of pons concerning for an acute pontine infarct.   Pt out of room at time of visit. RD familiar with pt from previous admission at which time pt reported good appetite, eating well, and maintaining weight around 130-135 lbs. Pt is currently NPO due to recent CVA. Diet advancement pending SLP swallow evaluation. Per weight history pt's weight has been fairly stable for the past 2 months.  Labs reviewed. Lipid panel WNL. HgbA1C pending.   Height: Ht Readings from Last 1 Encounters:  02/02/14 5\' 6"  (1.676 m)    Weight: Wt Readings from Last 1 Encounters:  02/03/14 130 lb 1.1 oz (59 kg)    Ideal Body Weight: 130 lbs  % Ideal Body Weight: 100%  Wt Readings from Last 10 Encounters:  02/03/14 130 lb 1.1 oz (59 kg)  01/19/14 135 lb 9.3 oz (61.499 kg)  01/04/14 127 lb 12.8 oz (57.97 kg)  12/14/13 133 lb (60.328 kg)  11/15/13 136 lb 14.4 oz (62.096 kg)  04/03/12 175 lb (79.379 kg)  04/03/12 175 lb (79.379  kg)  03/10/12 156 lb 1.4 oz (70.8 kg)  03/04/12 153 lb (69.4 kg)  02/05/12 167 lb 8.8 oz (76 kg)    Usual Body Weight: 130-135 lbs  % Usual Body Weight: 100%  BMI:  Body mass index is 21 kg/(m^2).  Estimated Nutritional Needs: Kcal: 1500-1700 Protein: 85-95 grams Fluid: 1.5 L/day  Skin: intact  Diet Order: NPO  EDUCATION NEEDS: -No education needs identified at this time   Intake/Output Summary (Last 24 hours) at 02/03/14 1444 Last data filed at 02/03/14 1011  Gross per 24 hour  Intake      0 ml  Output    750 ml  Net   -750 ml    Last BM: PTA   Labs:   Recent Labs Lab 02/02/14 1045 02/03/14 0509  NA 142 142  K 4.2 4.2  CL 98 102  CO2 35* 30  BUN 15 12  CREATININE 0.44* 0.40*  CALCIUM 10.1 9.5  GLUCOSE 94 95    CBG (last 3)   Recent Labs  02/02/14 2225  GLUCAP 116*    Scheduled Meds: . aspirin  300 mg Rectal Daily   Or  . aspirin  325 mg Oral Daily  . busPIRone  10 mg Oral TID  . cefTRIAXone (ROCEPHIN) IVPB 1 gram/50 mL D5W  1 g Intravenous Q24H  . enoxaparin (LOVENOX) injection  40 mg Subcutaneous Q24H  . furosemide  40 mg Oral BID  . LORazepam  0.5 mg Oral QHS  . mirtazapine  7.5 mg Oral QHS  .  multivitamin with minerals  1 tablet Oral Daily  . pantoprazole  40 mg Oral Daily  . polyethylene glycol  17 g Oral Daily  . potassium chloride SA  20 mEq Oral BID  . pregabalin  75 mg Oral BID  . sertraline  50 mg Oral Daily  . thiamine  100 mg Oral Daily  . tiotropium  18 mcg Inhalation Daily  . tiZANidine  4 mg Oral QHS  . verapamil  120 mg Oral Daily    Continuous Infusions:   Past Medical History  Diagnosis Date  . Anxiety   . Smoking   . Fibromyalgia   . Depression   . Hyperlipidemia   . Hypercalcemia   . Adrenal adenoma     Stable as of 05/2012 CT scan  . Chronic respiratory failure   . Coronary artery disease   . GERD (gastroesophageal reflux disease)   . Lymphedema syndrome, postmastectomy     RUE  . Breast CA 1970     rt breast  . CHF (congestive heart failure)   . COPD (chronic obstructive pulmonary disease)   . On home oxygen therapy     "3L; 24/7" (11/11/2013)  . DJD (degenerative joint disease)   . Arthritis     "joints" (11/11/2013)  . Chronic back pain     "shoulders to lower back" (11/11/2013)  . Frequent UTI     Past Surgical History  Procedure Laterality Date  . Abdominal hysterectomy    . Appendectomy    . Squamous cell carcinoma excision      Floor of mouth  . Colonoscopy w/ biopsies and polypectomy    . Nasal sinus surgery  1994  . Eus  04/03/2012    Procedure: UPPER ENDOSCOPIC ULTRASOUND (EUS) LINEAR;  Surgeon: Milus Banister, MD;  Location: [facility];  Service: Endoscopy;  Laterality: N/A;  radial linear  . Mastectomy, radical Right 1970  . Breast biopsy Right ~ 1970  . Refractive surgery Bilateral ~ 2012  . Back surgery    . Posterior lumbar fusion  ?[place], LDN Inpatient Clinical Dietitian Pager: [phone] After Hours Pager: [phone]

## 2014-02-03 NOTE — Evaluation (Signed)
Physical Therapy Evaluation Patient Details Name: Theresa Blackwell MRN: [id number] DOB: May 08, 1931 Today's Date: 02/03/2014   History of Present Illness  80 y.o. female admitted to [facility] on 02/02/14 with AMS and SOB.  She has been dx with acute encepholopathy, UTI, TIA vs CVA (however, CT was suggestive of pons infart, however, MRI showed no acute infarct).  Pt has significant PMhx of  acute cholecystitis status post cholecystostomy tube, COPD/chronic respiratory failure -home O2-dependent on 3 L, chronic pain on multiple narcotics (fibromyalgia), Breast CA with right mastectomy and right arm lymphedema, CHF, and lumbar surgery.    Clinical Impression  Per son's report, pt is presenting weaker than her usual level of mobility.  She has some baseline dementia and he wonders if is has been exacerbated by new pain medication.  She needs assist with all levels of mobility and may benefit from RW use (will try next session).  She would benefit from HHPT (son not sure, but will think about it) for continued balance and strength training as well as safe gait training in her home environment.   PT to follow acutely for deficits listed below.       Follow Up Recommendations Home health PT;Supervision/Assistance - 24 hour    Equipment Recommendations  None recommended by PT    Recommendations for Other Services   NA    Precautions / Restrictions Precautions Precautions: Fall Precaution Comments: h/o dementia, requiring assist to ambulate right now.  Restrictions Weight Bearing Restrictions: No      Mobility  Bed Mobility Overal bed mobility: Needs Assistance Bed Mobility: Supine to Sit Rolling: Min guard Sidelying to sit: Min guard Supine to sit: Min guard Sit to supine: Mod assist   General bed mobility comments: Min guard assist to support trunk during transitions to EOB.  Pt using bed rail for leverage and having difficulty on compliant mattress.   Transfers Overall transfer level: Needs  assistance Equipment used: Quad cane Transfers: Sit to/from Stand Sit to Stand: Min assist         General transfer comment: Min assist to support trunk for balance during transitions.   Ambulation/Gait Ambulation/Gait assistance: Min assist Ambulation Distance (Feet): 20 Feet Assistive device: Quad cane Gait Pattern/deviations: Step-through pattern;Shuffle;Trunk flexed Gait velocity: decreased Gait velocity interpretation: Below normal speed for age/gender General Gait Details: Pt having difficulty holding onto her quad cane with her right hand.  Son reports there are some days where her grip strength varies due to the right arm lymphedema.  Therapist used hand over hand assist to help pt maintain grip and correctly sequence the quad cane.  Min assist provided at trunk for balance.  Per son report she can normally get to the bathroom and back on her own without family's assist and he says she is looking weaker than usual.           Balance Overall balance assessment: Needs assistance Sitting-balance support: Feet supported;No upper extremity supported Sitting balance-Leahy Scale: Good     Standing balance support: Single extremity supported;During functional activity Standing balance-Leahy Scale: Fair Standing balance comment: static standing she can do unsupported, however, any dynamic activity requires assist right now.                              Pertinent Vitals/Pain Pain Assessment: No/denies pain (at rest)    Home Living Family/patient expects to be discharged to:: Private residence Living Arrangements: Spouse/significant other;Children Available Help at  Discharge: Family;Available 24 hours/day;Friend(s) Type of Home: House Home Access: Stairs to enter Entrance Stairs-Rails: Can reach both Entrance Stairs-Number of Steps: 3-4 per patient report Home Layout: One level Home Equipment: Bedside commode;Cane - single point;Walker - standard (hurry  cane) Additional Comments: Patient with 24/7 supervision PTA for ~1 yr now.     Prior Function Level of Independence: Needs assistance   Gait / Transfers Assistance Needed: Patient's son stated she was ambulting with cane more than RW recently  ADL's / Homemaking Assistance Needed: Patient needs assistance with IADL tasks  Comments: on 3-4 L O2 [place] "90% of the time" at home per son report.  He also reports she smokes.       Hand Dominance   Dominant Hand: Right    Extremity/Trunk Assessment   Upper Extremity Assessment: Defer to OT evaluation RUE Deficits / Details: lymphedema with compression garment in place RUE:  (Patient with general weakness, but overall WFL)       Lower Extremity Assessment: Generalized weakness (3+/5 throughout, equal bil legs)      Cervical / Trunk Assessment: Kyphotic  Communication   Communication: No difficulties  Cognition Arousal/Alertness: Awake/alert Behavior During Therapy: WFL for tasks assessed/performed;Anxious;Impulsive Overall Cognitive Status: History of cognitive impairments - at baseline Area of Impairment: Orientation;Following commands;Awareness;Problem solving;Memory Orientation Level: Disoriented to;Person (patient unable to state year of birth, even with total cues)     Following Commands: Follows multi-step commands inconsistently Safety/Judgement: Decreased awareness of safety   Problem Solving: Decreased initiation;Requires verbal cues General Comments: Dementia at baseline, see also details in OT eval             Assessment/Plan    PT Assessment Patient needs continued PT services  PT Diagnosis Difficulty walking;Abnormality of gait;Generalized weakness;Altered mental status   PT Problem List Decreased strength;Decreased activity tolerance;Decreased balance;Decreased mobility;Decreased cognition;Decreased knowledge of use of DME;Decreased safety awareness;Pain  PT Treatment Interventions DME instruction;Gait  training;Stair training;Functional mobility training;Therapeutic activities;Therapeutic exercise;Balance training;Neuromuscular re-education;Cognitive remediation;Patient/family education;Modalities   PT Goals (Current goals can be found in the Care Plan section) Acute Rehab PT Goals Patient Stated Goal: unable to state, son says he would like for her to be independent getting up and going back and forth to the bathroom like she was PTA PT Goal Formulation: With patient/family Time For Goal Achievement: 02/17/14 Potential to Achieve Goals: Good    Frequency Min 3X/week    End of Session Equipment Utilized During Treatment: Gait belt;Oxygen Activity Tolerance: Patient limited by fatigue Patient left: in chair;with call bell/phone within reach;with chair alarm set Nurse Communication: Other (comment) (activity belt would be helpful)         Time: 1022-1101 PT Time Calculation (min): 39 min   Charges:   PT Evaluation $Initial PT Evaluation Tier I: 1 Procedure PT Treatments $Therapeutic Activity: 23-37 mins        Rosine Solecki B. [place], [place], DPT [phone]   02/03/2014, 1:11 PM

## 2014-02-03 NOTE — Consult Note (Signed)
Reason for Consult: Altered mental status.  HPI:                                                                                                                                          Theresa Blackwell is an 80 y.o. female with multiple medical including COPD, diastolic heart failure, acute cholecystitis, coronary artery disease, status post right mastectomy for breast cancer and frequent UTIs, who was brought to the emergency room because of a change in mental status. Patient appear to be lethargic on waking up this morning and remained sleepy and confused. No focal deficits were noted. CT scan of her head showed equivocal changes suggestive of possible pontine stroke. MRI of the brain showed no acute stroke. MRA showed no large vessel intracranial occlusion. ABG showed acute hypercarbia and hypoxia. AST and ALT were markedly elevated. Patient reportedly has been taking a large amount of Tylenol for pain management.  Past Medical History  Diagnosis Date  . Anxiety   . Smoking   . Fibromyalgia   . Depression   . Hyperlipidemia   . Hypercalcemia   . Adrenal adenoma     Stable as of 05/2012 CT scan  . Chronic respiratory failure   . Coronary artery disease   . GERD (gastroesophageal reflux disease)   . Lymphedema syndrome, postmastectomy     RUE  . Breast CA 1970    rt breast  . CHF (congestive heart failure)   . COPD (chronic obstructive pulmonary disease)   . On home oxygen therapy     "3L; 24/7" (11/11/2013)  . DJD (degenerative joint disease)   . Arthritis     "joints" (11/11/2013)  . Chronic back pain     "shoulders to lower back" (11/11/2013)  . Frequent UTI     Past Surgical History  Procedure Laterality Date  . Abdominal hysterectomy    . Appendectomy    . Squamous cell carcinoma excision      Floor of mouth  . Colonoscopy w/ biopsies and polypectomy    . Nasal sinus surgery  1994  . Eus  04/03/2012    Procedure: UPPER ENDOSCOPIC ULTRASOUND (EUS) LINEAR;  Surgeon:  Milus Banister, MD;  Location: [facility];  Service: Endoscopy;  Laterality: N/A;  radial linear  . Mastectomy, radical Right 1970  . Breast biopsy Right ~ 1970  . Refractive surgery Bilateral ~ 2012  . Back surgery    . Posterior lumbar fusion  ?1970's    Family History  Problem Relation Age of Onset  . Colon cancer Brother   . Heart disease Father     Social History:  reports that she has been smoking Cigarettes.  She has a 64 pack-year smoking history. She has never used smokeless tobacco. She reports that she does not drink alcohol or use illicit drugs.  No Known Allergies  MEDICATIONS:  I have reviewed the patient's current medications.   ROS:                                                                                                                                       History obtained from patient's grandson and chart review  General ROS: negative for - chills, fatigue, fever, night sweats, weight gain or weight loss Psychological ROS: negative for - behavioral disorder, hallucinations, memory difficulties, mood swings or suicidal ideation Ophthalmic ROS: negative for - blurry vision, double vision, eye pain or loss of vision ENT ROS: negative for - epistaxis, nasal discharge, oral lesions, sore throat, tinnitus or vertigo Allergy and Immunology ROS: negative for - hives or itchy/watery eyes Hematological and Lymphatic ROS: negative for - bleeding problems, bruising or swollen lymph nodes Endocrine ROS: negative for - galactorrhea, hair pattern changes, polydipsia/polyuria or temperature intolerance Respiratory ROS: negative for - cough, hemoptysis, shortness of breath or wheezing Cardiovascular ROS: negative for - chest pain, dyspnea on exertion, edema or irregular heartbeat Gastrointestinal ROS: Acute cholecystitis presentation Genito-Urinary ROS:  negative for - dysuria, hematuria, incontinence or urinary frequency/urgency Musculoskeletal ROS: Positive for chronic musculoskeletal pain  Neurological ROS: as noted in HPI Dermatological ROS: negative for rash and skin lesion changes   Blood pressure 102/40, pulse 55, temperature 97.9 F (36.6 C), temperature source Oral, resp. rate 18, height _0  (1.676 m), weight 59 kg (130 lb 1.1 oz), SpO2 96.00%.   Neurologic Examination:                                                                                                      Patient was stuporous and only minimally arousable. He appear to be in no acute distress. Verbal responses for minimal and intelligible. Pupils equal and reacted normally to light. Extraocular movements were full and conjugate. No facial weakness was noted. Muscle tone was flaccid throughout. She moved extremities equally spontaneously. Deep tendon reflexes were 1+ and symmetrical. Plantars this mute bilaterally.  Lab Results  Component Value Date/Time   CHOL 141 02/03/2014  5:09 AM    Results for orders placed during the hospital encounter of 02/02/14 (from the past 48 hour(s))  CBC WITH DIFFERENTIAL     Status: Abnormal   Collection Time    02/02/14 10:45 AM      Result Value Ref Range   WBC 8.6  4.0 - 10.5 K/uL   RBC 3.43 (*) 3.87 - 5.11 MIL/uL   Hemoglobin 10.8 (*) 12.0 -  15.0 g/dL   HCT 35.3 (*) 36.0 - 46.0 %   MCV 102.9 (*) 78.0 - 100.0 fL   MCH 31.5  26.0 - 34.0 pg   MCHC 30.6  30.0 - 36.0 g/dL   RDW 14.1  11.5 - 15.5 %   Platelets 146 (*) 150 - 400 K/uL   Neutrophils Relative % 77  43 - 77 %   Neutro Abs 6.6  1.7 - 7.7 K/uL   Lymphocytes Relative 12  12 - 46 %   Lymphs Abs 1.0  0.7 - 4.0 K/uL   Monocytes Relative 11  3 - 12 %   Monocytes Absolute 0.9  0.1 - 1.0 K/uL   Eosinophils Relative 0  0 - 5 %   Eosinophils Absolute 0.0  0.0 - 0.7 K/uL   Basophils Relative 0  0 - 1 %   Basophils Absolute 0.0  0.0 - 0.1 K/uL  COMPREHENSIVE  METABOLIC PANEL     Status: Abnormal   Collection Time    02/02/14 10:45 AM      Result Value Ref Range   Sodium 142  137 - 147 mEq/L   Potassium 4.2  3.7 - 5.3 mEq/L   Chloride 98  96 - 112 mEq/L   CO2 35 (*) 19 - 32 mEq/L   Glucose, Bld 94  70 - 99 mg/dL   BUN 15  6 - 23 mg/dL   Creatinine, Ser 0.44 (*) 0.50 - 1.10 mg/dL   Calcium 10.1  8.4 - 10.5 mg/dL   Total Protein 6.4  6.0 - 8.3 g/dL   Albumin 3.3 (*) 3.5 - 5.2 g/dL   AST 12  0 - 37 U/L   ALT 9  0 - 35 U/L   Alkaline Phosphatase 65  39 - 117 U/L   Total Bilirubin 0.8  0.3 - 1.2 mg/dL   GFR calc non Af Amer >90  >90 mL/min   GFR calc Af Amer >90  >90 mL/min   Comment: (NOTE)     The eGFR has been calculated using the CKD EPI equation.     This calculation has not been validated in all clinical situations.     eGFR's persistently <90 mL/min signify possible Chronic Kidney     Disease.   Anion gap 9  5 - 15  AMMONIA     Status: None   Collection Time    02/02/14 10:45 AM      Result Value Ref Range   Ammonia 29  11 - 60 umol/L  ETHANOL     Status: None   Collection Time    02/02/14 10:45 AM      Result Value Ref Range   Alcohol, Ethyl (B) <11  0 - 11 mg/dL   Comment:            LOWEST DETECTABLE LIMIT FOR     SERUM ALCOHOL IS 11 mg/dL     FOR MEDICAL PURPOSES ONLY  I-STAT ARTERIAL BLOOD GAS, ED     Status: Abnormal   Collection Time    02/02/14 10:54 AM      Result Value Ref Range   pH, Arterial 7.374  7.350 - 7.450   pCO2 arterial 62.9 (*) 35.0 - 45.0 mmHg   pO2, Arterial 81.0  80.0 - 100.0 mmHg   Bicarbonate 36.7 (*) 20.0 - 24.0 mEq/L   TCO2 39  0 - 100 mmol/L   O2 Saturation 95.0     Acid-Base Excess 9.0 (*) 0.0 - 2.0  mmol/L   Patient temperature 98.6 F     Collection site RADIAL, ALLEN'S TEST ACCEPTABLE     Drawn by Operator     Sample type ARTERIAL     Comment NOTIFIED PHYSICIAN    I-STAT CG4 LACTIC ACID, ED     Status: None   Collection Time    02/02/14 10:55 AM      Result Value Ref Range    Lactic Acid, Venous 0.69  0.5 - 2.2 mmol/L  URINALYSIS, ROUTINE W REFLEX MICROSCOPIC     Status: Abnormal   Collection Time    02/02/14 11:36 AM      Result Value Ref Range   Color, Urine YELLOW  YELLOW   APPearance CLOUDY (*) CLEAR   Specific Gravity, Urine 1.016  1.005 - 1.030   pH 5.5  5.0 - 8.0   Glucose, UA NEGATIVE  NEGATIVE mg/dL   Hgb urine dipstick SMALL (*) NEGATIVE   Bilirubin Urine NEGATIVE  NEGATIVE   Ketones, ur NEGATIVE  NEGATIVE mg/dL   Protein, ur 30 (*) NEGATIVE mg/dL   Urobilinogen, UA 0.2  0.0 - 1.0 mg/dL   Nitrite POSITIVE (*) NEGATIVE   Leukocytes, UA LARGE (*) NEGATIVE  URINE MICROSCOPIC-ADD ON     Status: Abnormal   Collection Time    02/02/14 11:36 AM      Result Value Ref Range   Squamous Epithelial / LPF FEW (*) RARE   WBC, UA TOO NUMEROUS TO COUNT  <3 WBC/hpf   RBC / HPF 3-6  <3 RBC/hpf   Bacteria, UA MANY (*) RARE  GLUCOSE, CAPILLARY     Status: Abnormal   Collection Time    02/02/14 10:25 PM      Result Value Ref Range   Glucose-Capillary 116 (*) 70 - 99 mg/dL   Comment 1 Documented in Chart     Comment 2 Notify RN    TROPONIN I     Status: None   Collection Time    02/03/14 12:00 AM      Result Value Ref Range   Troponin I <0.30  <0.30 ng/mL   Comment:            Due to the release kinetics of cTnI,     a negative result within the first hours     of the onset of symptoms does not rule out     myocardial infarction with certainty.     If myocardial infarction is still suspected,     repeat the test at appropriate intervals.  BLOOD GAS, ARTERIAL     Status: Abnormal   Collection Time    02/03/14  4:40 AM      Result Value Ref Range   O2 Content 4.0     Delivery systems NASAL CANNULA     pH, Arterial 7.312 (*) 7.350 - 7.450   pCO2 arterial 70.0 (*) 35.0 - 45.0 mmHg   Comment: CRITICAL RESULT CALLED TO, READ BACK BY AND VERIFIED WITH:      KELSEY [place], RN AT 0448, BY ASHLEY BROOKER RRT, RCP ON 02/03/2014   pO2, Arterial 82.8  80.0 -  100.0 mmHg   Bicarbonate 34.4 (*) 20.0 - 24.0 mEq/L   TCO2 36.6  0 - 100 mmol/L   Acid-Base Excess 8.2 (*) 0.0 - 2.0 mmol/L   O2 Saturation 95.1     Patient temperature 98.4     Collection site LEFT RADIAL     Drawn by [id number]     Sample  type ARTERIAL DRAW     Allens test (pass/fail) PASS  PASS  CBC     Status: Abnormal   Collection Time    02/03/14  5:09 AM      Result Value Ref Range   WBC 8.0  4.0 - 10.5 K/uL   RBC 3.35 (*) 3.87 - 5.11 MIL/uL   Hemoglobin 10.5 (*) 12.0 - 15.0 g/dL   HCT 34.3 (*) 36.0 - 46.0 %   MCV 102.4 (*) 78.0 - 100.0 fL   MCH 31.3  26.0 - 34.0 pg   MCHC 30.6  30.0 - 36.0 g/dL   RDW 14.0  11.5 - 15.5 %   Platelets 110 (*) 150 - 400 K/uL   Comment: REPEATED TO VERIFY     SPECIMEN CHECKED FOR CLOTS     PLATELET COUNT CONFIRMED BY SMEAR     DELTA CHECK NOTED  BASIC METABOLIC PANEL     Status: Abnormal   Collection Time    02/03/14  5:09 AM      Result Value Ref Range   Sodium 142  137 - 147 mEq/L   Potassium 4.2  3.7 - 5.3 mEq/L   Chloride 102  96 - 112 mEq/L   CO2 30  19 - 32 mEq/L   Glucose, Bld 95  70 - 99 mg/dL   BUN 12  6 - 23 mg/dL   Creatinine, Ser 0.40 (*) 0.50 - 1.10 mg/dL   Calcium 9.5  8.4 - 10.5 mg/dL   GFR calc non Af Amer >90  >90 mL/min   GFR calc Af Amer >90  >90 mL/min   Comment: (NOTE)     The eGFR has been calculated using the CKD EPI equation.     This calculation has not been validated in all clinical situations.     eGFR's persistently <90 mL/min signify possible Chronic Kidney     Disease.   Anion gap 10  5 - 15  TROPONIN I     Status: None   Collection Time    02/03/14  5:09 AM      Result Value Ref Range   Troponin I <0.30  <0.30 ng/mL   Comment:            Due to the release kinetics of cTnI,     a negative result within the first hours     of the onset of symptoms does not rule out     myocardial infarction with certainty.     If myocardial infarction is still suspected,     repeat the test at appropriate  intervals.  LIPID PANEL     Status: None   Collection Time    02/03/14  5:09 AM      Result Value Ref Range   Cholesterol 141  0 - 200 mg/dL   Triglycerides 77  <150 mg/dL   HDL 75  >39 mg/dL   Total CHOL/HDL Ratio 1.9     VLDL 15  0 - 40 mg/dL   LDL Cholesterol 51  0 - 99 mg/dL   Comment:            Total Cholesterol/HDL:CHD Risk     Coronary Heart Disease Risk Table                         Men   Women      1/2 Average Risk   3.4   3.3      Average Risk  5.0   4.4      2 X Average Risk   9.6   7.1      3 X Average Risk  23.4   11.0                Use the calculated Patient Ratio     above and the CHD Risk Table     to determine the patient's CHD Risk.                ATP III CLASSIFICATION (LDL):      <100     mg/dL   Optimal      100-129  mg/dL   Near or Above                        Optimal      130-159  mg/dL   Borderline      160-189  mg/dL   High      >190     mg/dL   Very High    Dg Chest 2 View  02/02/2014   CLINICAL DATA:  Shortness some breath, confusion  EXAM: CHEST  2 VIEW  COMPARISON:  01/15/2014  FINDINGS: Cardiomediastinal silhouette is stable. No acute infiltrate or pleural effusion. No pulmonary edema. Mild degenerative changes thoracic spine. Stable right apical pleural parenchymal scarring.  IMPRESSION: No active cardiopulmonary disease.   Electronically Signed   By: Lahoma Crocker M.D.   On: 02/02/2014 11:47   Ct Head Wo Contrast  02/02/2014   CLINICAL DATA:  Shortness of breath, altered mental status  EXAM: CT HEAD WITHOUT CONTRAST  TECHNIQUE: Contiguous axial images were obtained from the base of the skull through the vertex without intravenous contrast.  COMPARISON:  01/15/2014,  FINDINGS: There is no evidence of mass effect, midline shift, or extra-axial fluid collections. There is no evidence of a space-occupying lesion or intracranial hemorrhage. There is no evidence of a cortical-based area of acute infarction. There is relative low attenuation in the  left side of the pons concerning for an acute pontine infarct. There is generalized cerebral atrophy. There is periventricular white matter low attenuation likely secondary to microangiopathy.  The ventricles and sulci are appropriate for the patient's age. The basal cisterns are patent.  Visualized portions of the orbits are unremarkable. The visualized portions of the paranasal sinuses and mastoid air cells are unremarkable. Cerebrovascular atherosclerotic calcifications are noted.  The osseous structures are unremarkable.  IMPRESSION: 1. Relative low attenuation in the left side of the pons concerning for an acute pontine infarct.   Electronically Signed   By: Kathreen Devoid   On: 02/02/2014 12:56   Mr Brain Wo Contrast  02/02/2014   CLINICAL DATA:  Altered mental status. Possible pontine infarct on head CT.  EXAM: MRI HEAD WITHOUT CONTRAST  MRA HEAD WITHOUT CONTRAST  TECHNIQUE: Multiplanar, multiecho pulse sequences of the brain and surrounding structures were obtained without intravenous contrast. Angiographic images of the head were obtained using MRA technique without contrast.  COMPARISON:  Head CT 02/02/2014  FINDINGS: MRI HEAD FINDINGS  Images are mildly to moderately degraded by motion. There is no evidence of acute infarct, intracranial hemorrhage, mass, midline shift, or extra-axial fluid collection. Patchy and confluent T2 hyperintensities in the subcortical and deep cerebral white matter and pons are nonspecific but compatible with moderate chronic small vessel ischemic disease. A small, remote infarct is noted in the left centrum semiovale. There is mild, age-appropriate cerebral atrophy.  Prior bilateral cataract extraction is noted. There is a small left mastoid effusion. Major intracranial vascular flow voids are grossly preserved.  MRA HEAD FINDINGS  Images are moderately to severely degraded by motion. Visualized distal vertebral arteries are patent and codominant. PICA origins are patent. Left  AICA appears dominant. SCA origins are patent. Basilar artery is patent without stenosis. There is a small right posterior communicating artery. Right P1 segment appears hypoplastic. Proximal PCAs are grossly unremarkable, with limited branch vessel evaluation due to medication.  Internal carotid arteries are patent from skullbase to carotid termini. Proximal MCAs are patent without evidence of significant stenosis. M2 and more distal MCA branch vessels are not well evaluated due to motion. Left A1 segment is unremarkable. Right A1 is not well evaluated due to motion. A2 segments appear patent bilaterally, with detailed evaluation limited by motion.  IMPRESSION: 1. No evidence of acute intracranial abnormality. 2. Moderate chronic small vessel ischemic disease in the deep cerebral white matter and pons. 3. Limited head MRA due to motion. No evidence of major intracranial arterial occlusion.   Electronically Signed   By: Logan Bores   On: 02/02/2014 19:53   Mr Jodene Nam Head/brain Wo Cm  02/02/2014   CLINICAL DATA:  Altered mental status. Possible pontine infarct on head CT.  EXAM: MRI HEAD WITHOUT CONTRAST  MRA HEAD WITHOUT CONTRAST  TECHNIQUE: Multiplanar, multiecho pulse sequences of the brain and surrounding structures were obtained without intravenous contrast. Angiographic images of the head were obtained using MRA technique without contrast.  COMPARISON:  Head CT 02/02/2014  FINDINGS: MRI HEAD FINDINGS  Images are mildly to moderately degraded by motion. There is no evidence of acute infarct, intracranial hemorrhage, mass, midline shift, or extra-axial fluid collection. Patchy and confluent T2 hyperintensities in the subcortical and deep cerebral white matter and pons are nonspecific but compatible with moderate chronic small vessel ischemic disease. A small, remote infarct is noted in the left centrum semiovale. There is mild, age-appropriate cerebral atrophy.  Prior bilateral cataract extraction is noted.  There is a small left mastoid effusion. Major intracranial vascular flow voids are grossly preserved.  MRA HEAD FINDINGS  Images are moderately to severely degraded by motion. Visualized distal vertebral arteries are patent and codominant. PICA origins are patent. Left AICA appears dominant. SCA origins are patent. Basilar artery is patent without stenosis. There is a small right posterior communicating artery. Right P1 segment appears hypoplastic. Proximal PCAs are grossly unremarkable, with limited branch vessel evaluation due to medication.  Internal carotid arteries are patent from skullbase to carotid termini. Proximal MCAs are patent without evidence of significant stenosis. M2 and more distal MCA branch vessels are not well evaluated due to motion. Left A1 segment is unremarkable. Right A1 is not well evaluated due to motion. A2 segments appear patent bilaterally, with detailed evaluation limited by motion.  IMPRESSION: 1. No evidence of acute intracranial abnormality. 2. Moderate chronic small vessel ischemic disease in the deep cerebral white matter and pons. 3. Limited head MRA due to motion. No evidence of major intracranial arterial occlusion.   Electronically Signed   By: Logan Bores   On: 02/02/2014 19:53    Assessment/Plan: 80 year old lady with multiple medical problems presenting with altered mental status is likely secondary to multiple factors, including encephalopathy associated with hypoxia and hypercarbia as well as hepatic dysfunction, and sedating medications as well as possible infectious process. Patient has no clinical signs of acute stroke and no indications on MRI of stroke as well.  TIA is unlikely with no focal deficits described.  Recommendations: 1. EEG, routine study to assess severity of encephalopathy. 2. Continue aspirin daily vitamin 25 mg by mouth or 300 mg suppository. 3. Minimize potentially sedating medications. 4. No neurological contraindication to surgical  intervention for cholecystectomy.  We will continue to follow this patient with you.  C.R. Nicole Kindred, MD [facility] [phone]  02/03/2014, 6:45 AM

## 2014-02-03 NOTE — Progress Notes (Signed)
At 2129 patient kicking, yelling, attempting to bite, refusing all po meds.  Royanne Foots notified, new orders received & initiated. IV restarted & secured, NSL flushed, unremarkable.

## 2014-02-03 NOTE — Progress Notes (Addendum)
CRITICAL VALUE ALERT  Critical value received: Aerobic blood cultures (2 sets) positive for gram variable rods.  Date of notification:  02/03/14  Time of notification:  0948  Critical value read back:Yes.    Nurse who received alert:  Teresa Coombs  MD notified (1st page):  Dr. Ree Kida  Time of first page:  [phone]  MD notified (2nd page):  Time of second page:  Responding MD: Dr. Ree Kida   Time MD responded: [phone]  No new orders received at this time.

## 2014-02-03 NOTE — Progress Notes (Signed)
Pt agitated about not being able to drink (she is NPO pending a swallow evaluation) and about not being free to walk around the room freely. Bedside RN has explained her situation multiple times. Family at bedside. Family cooperative and trying to calm pt down as well.

## 2014-02-03 NOTE — Evaluation (Signed)
Clinical/Bedside Swallow Evaluation Patient Details  Name: MAYO OWCZARZAK MRN: [id number] Date of Birth: 1930-08-02  Today's Date: 02/03/2014 Time: 4680-3212 SLP Time Calculation (min): 10 min  Past Medical History:  Past Medical History  Diagnosis Date  . Anxiety   . Smoking   . Fibromyalgia   . Depression   . Hyperlipidemia   . Hypercalcemia   . Adrenal adenoma     Stable as of 05/2012 CT scan  . Chronic respiratory failure   . Coronary artery disease   . GERD (gastroesophageal reflux disease)   . Lymphedema syndrome, postmastectomy     RUE  . Breast CA 1970    rt breast  . CHF (congestive heart failure)   . COPD (chronic obstructive pulmonary disease)   . On home oxygen therapy     "3L; 24/7" (11/11/2013)  . DJD (degenerative joint disease)   . Arthritis     "joints" (11/11/2013)  . Chronic back pain     "shoulders to lower back" (11/11/2013)  . Frequent UTI    Past Surgical History:  Past Surgical History  Procedure Laterality Date  . Abdominal hysterectomy    . Appendectomy    . Squamous cell carcinoma excision      Floor of mouth  . Colonoscopy w/ biopsies and polypectomy    . Nasal sinus surgery  1994  . Eus  04/03/2012    Procedure: UPPER ENDOSCOPIC ULTRASOUND (EUS) LINEAR;  Surgeon: Milus Banister, MD;  Location: [facility];  Service: Endoscopy;  Laterality: N/A;  radial linear  . Mastectomy, radical Right 1970  . Breast biopsy Right ~ 1970  . Refractive surgery Bilateral ~ 2012  . Back surgery    . Posterior lumbar fusion  ?56's   HPI:  80 y.o. female admitted to [facility] on 02/02/14 with AMS and SOB.  She has been dx with acute encepholopathy, UTI, TIA vs CVA (however, CT was suggestive of pons infart, however, MRI showed no acute infarct).  Pt has significant PMhx of  acute cholecystitis status post cholecystostomy tube, COPD/chronic respiratory failure -home O2-dependent on 3 L, chronic pain on multiple narcotics (fibromyalgia), Breast CA with right  mastectomy and right arm lymphedema, CHF, and lumbar surgery.     Assessment / Plan / Recommendation Clinical Impression  Bedside swallow evaluation completed following change in function and rapid response call yesterday pm.  Oral motor exam was unremarkable.  Patient presents with baseline cough and intermittent delayed cough/throat clears with oral expectoration of clear secretions throughout session.  Cues for small portions and a slow pace effectively prevented immediate overt s/s of aspiration with thin liquid sips.  Recommend initiation of regular textures and thin liquids with full supervision given cognition and need for consistent cues to carryover safe swallow strategies.  SLP to follow briefly on acute for toleration.      Aspiration Risk  Mild    Diet Recommendation Regular;Thin liquid   Liquid Administration via: Cup;Straw Medication Administration: Whole meds with liquid (one at a time) Supervision: Patient able to self feed;Full supervision/cueing for compensatory strategies Compensations: Slow rate;Small sips/bites;Check for pocketing Postural Changes and/or Swallow Maneuvers: Out of bed for meals;Seated upright 90 degrees    Other  Recommendations Oral Care Recommendations: Oral care BID   Follow Up Recommendations  Other (comment) (TBD)    Frequency and Duration min 2x/week  1 week   Pertinent Vitals/Pain None    SLP Swallow Goals  See Care Plan for details    Swallow Study  Prior Functional Status  Type of Home: House Available Help at Discharge: Family;Available 24 hours/day;Friend(s)    General Date of Onset: 02/02/14 HPI: 80 y.o. female admitted to [facility] on 02/02/14 with AMS and SOB.  She has been dx with acute encepholopathy, UTI, TIA vs CVA (however, CT was suggestive of pons infart, however, MRI showed no acute infarct).  Pt has significant PMhx of  acute cholecystitis status post cholecystostomy tube, COPD/chronic respiratory failure -home O2-dependent on 3  L, chronic pain on multiple narcotics (fibromyalgia), Breast CA with right mastectomy and right arm lymphedema, CHF, and lumbar surgery.   Type of Study: Bedside swallow evaluation Previous Swallow Assessment: none on record Diet Prior to this Study: NPO Temperature Spikes Noted: No Respiratory Status: Nasal cannula History of Recent Intubation: No Behavior/Cognition: Alert;Confused;Distractible;Requires cueing;Decreased sustained attention Oral Cavity - Dentition: Dentures, top;Dentures, bottom Self-Feeding Abilities: Able to feed self;Needs set up Patient Positioning: Upright in bed Baseline Vocal Quality: Clear Volitional Cough: Strong;Congested (smoker at baseline) Volitional Swallow: Able to elicit    Oral/Motor/Sensory Function Overall Oral Motor/Sensory Function: Appears within functional limits for tasks assessed   Ice Chips Ice chips: Within functional limits   Thin Liquid Thin Liquid: Impaired Presentation: Cup;Self Fed Pharyngeal  Phase Impairments: Throat Clearing - Delayed;Cough - Delayed;Throat Clearing - Immediate;Cough - Immediate Other Comments: cues for portion control and pacing were effective at preventing immediate cough/throat clears.  Suspect delayed coughs/throat clears realted to history of smoking    Nectar Thick Nectar Thick Liquid: Not tested   Honey Thick Honey Thick Liquid: Not tested   Puree Puree: Within functional limits   Solid   GO    Solid: Within functional limits      Carmelia Roller., CCC-SLP [phone]  Lucy Woolever 02/03/2014,4:08 PM

## 2014-02-03 NOTE — Progress Notes (Signed)
Before giving 2200 meds, this RN asked repeatedly (twice) to pt and pt friend Kasandra Knudsen Quillian Quince) if this is the medication she takes at home every night, with the name of the medication, and its dose. They replied yes, gave pt medications due at 2200 at 2140. Pt q2hr neuro and vital signs check at 2200, BP had dropped 91/35 and pt was diaphoretic, pt arousable with sternal rub. Paged rapid response and on call Baltazar Najjar. Dr Nicole Kindred walked in room at time and ordered 229ml bolus of NS. Pt follows commands intermittently. Called Son, Jeneen Rinks, and informed this RN that pt was not being given the MS contin and fentanyl patch at home because it made her to drowsy and confused. Jeneen Rinks stated that they told EMS this when pt was picked up, but must have not been passed along. Pt responded to first dose of 0.4mg  narcan increasing LOC, second dose of 0.4mg  narcan no change. Pt continues to answer orientation questions same, stating name and age, location correct, but date not correct (consistent with initial assessment. Increasing LOC in time, q1 hr VSS. Pt tried to use bedpan at 0220, unsuccessful. Bladder scan showed >664, pt attempted to void on BSC, voided 178ml. Rescan shows >589ml. In and out cath 23french at 0250 emptied 624ml. Post cath residual <62ml. Pt states she feels better. 0447 ABG critical value alert pH 7.31 CO2 70 pO2 82.8 HCO3 34.4, paged Kirby no new orders given 0449.   0500 Pt states she feels much better, alert and oriented to self, place, situation, not time.  Will continue to monitor.

## 2014-02-03 NOTE — Progress Notes (Signed)
Family expressed concern that the pt becomes "obsessed" with things on her arm (like an IV) and would feel more comfortable with the sitter present. Will discuss with charge RN.

## 2014-02-04 ENCOUNTER — Encounter (HOSPITAL_COMMUNITY): Payer: Self-pay | Admitting: General Practice

## 2014-02-04 ENCOUNTER — Inpatient Hospital Stay (HOSPITAL_COMMUNITY): Payer: Medicare Other

## 2014-02-04 DIAGNOSIS — R5381 Other malaise: Secondary | ICD-10-CM

## 2014-02-04 MED ORDER — HYDROCODONE-ACETAMINOPHEN 5-325 MG PO TABS
1.0000 | ORAL_TABLET | ORAL | Status: DC | PRN
  Administered 2014-02-04 – 2014-02-05 (×2): 1 via ORAL
  Filled 2014-02-04 (×2): qty 1

## 2014-02-04 MED ORDER — ZIPRASIDONE HCL 20 MG PO CAPS
20.0000 mg | ORAL_CAPSULE | Freq: Every day | ORAL | Status: DC
Start: 2014-02-04 — End: 2014-02-05
  Administered 2014-02-04: 20 mg via ORAL
  Filled 2014-02-04 (×2): qty 1
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
## 2014-02-04 NOTE — ED Provider Notes (Signed)
Medical screening examination/treatment/procedure(s) were conducted as a shared visit with non-physician practitioner(s) and myself.  I personally evaluated the patient during the encounter. Please see my previous note.    EKG Interpretation   Date/Time:  Tuesday February 02 2014 11:17:47 EDT Ventricular Rate:  74 PR Interval:  153 QRS Duration: 105 QT Interval:  403 QTC Calculation: 447 R Axis:   -60 Text Interpretation:  Sinus rhythm LAD, consider left anterior fascicular  block Abnormal R-wave progression, early transition LVH with secondary  repolarization abnormality When compared with ECG of 01/15/2014 No  significant change was found Confirmed by [facility]  MD, Delquan Poucher ([id number])  on 02/02/2014 11:47:52 AM        Francine Graven, DO 02/04/14 1552

## 2014-02-04 NOTE — Progress Notes (Signed)
Occupational Therapy Treatment Patient Details Name: Theresa Blackwell MRN: [id number] DOB: 1931-06-02 Today's Date: 02/04/2014    History of present illness 80 y.o. female admitted to [facility] on 02/02/14 with AMS and SOB.  She has been dx with acute encepholopathy, UTI, TIA vs CVA (however, CT was suggestive of pons infart, however, MRI showed no acute infarct).  Pt has significant PMhx of  acute cholecystitis status post cholecystostomy tube, COPD/chronic respiratory failure -home O2-dependent on 3 L, chronic pain on multiple narcotics (fibromyalgia), Breast CA with right mastectomy and right arm lymphedema, CHF, and lumbar surgery.     OT comments  Patient seen for OT session. Focused skilled intervention on sit<>stands using RW, functional mobility with RW, toilet transfer on/off elevated toilet seat, toileting (peri care & clothing management), grooming task of washing hands at sink, overall activity tolerance/endurance, and energy conservation techniques due to poor oxygen support. Patient on 3 liters of 02 via [place] during entire session. Sats remained <90%. Patient functioning at an overall supervision level using RW. Patient will continue to benefit from acute OT for functional strengthening, endurance, and increase independence back to PLOF (supervision level). Will continue to follow for above stated reasons and for education to patient and family.   Follow Up Recommendations  No OT follow up    Equipment Recommendations  Tub/shower seat    Recommendations for Other Services  (none at this time)    Precautions / Restrictions Precautions Precautions: Fall Precaution Comments:  (patient with baseline dementia, not as confused this session) Restrictions Weight Bearing Restrictions: No          Balance Overall balance assessment: Needs assistance         Standing balance support: Bilateral upper extremity supported Standing balance-Leahy Scale: Fair                     ADL  Overall ADL's : Needs assistance/impaired;At baseline     Grooming: Min Teacher, music: Designer, fashion/clothing and Hygiene: Min guard                Vision  No apparent vision impairments                           Cognition   Behavior During Therapy: WFL for tasks assessed/performed Overall Cognitive Status: History of cognitive impairments - at baseline Area of Impairment: Orientation;Following commands;Awareness;Problem solving;Memory        Following Commands: Follows one step commands consistently Safety/Judgement: Decreased awareness of safety;Decreased awareness of deficits   Problem Solving: Decreased initiation;Requires verbal cues                   Pertinent Vitals/ Pain       Pain Assessment: No/denies pain         Frequency Min 2X/week     Progress Toward Goals  OT Goals(current goals can now be found in the care plan section)  Progress towards OT goals: Progressing toward goals     Plan Discharge plan remains appropriate       End of Session Equipment Utilized During Treatment: Gait belt;Rolling walker   Activity Tolerance Patient tolerated treatment well   Patient Left in chair;with nursing/sitter in room     Time: 1125-1150 OT Time Calculation (min): 25 min  Charges: OT General Charges $  OT Visit: 1 Procedure OT Treatments $Self Care/Home Management : 23-37 mins (25)  Linlee Cromie, MS, OTR/L, CLT 02/04/2014, 12:08 PM

## 2014-02-04 NOTE — Progress Notes (Signed)
SLP Cancellation Note  Patient Details Name: Theresa Blackwell MRN: [id number] DOB: 02-03-1931   Cancelled treatment:       Reason Eval/Treat Not Completed: Patient at procedure or test/unavailable.  Will follow-up as able.    [name], M.A., CCC-SLP [phone]  [place] 02/04/2014, 2:24 PM

## 2014-02-04 NOTE — Progress Notes (Signed)
Patient is active with [facility] as prior to admission for HHRN/ PT; Aneta Mins [phone]

## 2014-02-04 NOTE — Progress Notes (Signed)
Physical Therapy Treatment Patient Details Name: Theresa Blackwell MRN: [id number] DOB: May 13, 1931 Today's Date: 02/04/2014    History of Present Illness 80 y.o. female admitted to [facility] on 02/02/14 with AMS and SOB.  She has been dx with acute encepholopathy, UTI, TIA vs CVA (however, CT was suggestive of pons infart, however, MRI showed no acute infarct).  Pt has significant PMhx of  acute cholecystitis status post cholecystostomy tube, COPD/chronic respiratory failure -home O2-dependent on 3 L, chronic pain on multiple narcotics (fibromyalgia), Breast CA with right mastectomy and right arm lymphedema, CHF, and lumbar surgery.      PT Comments    Pt is progressing well with gait despite not feeling well today per pt report.  She looks much more stable walking with the RW than with the quad cane and this would be my recommendation at home is to use her RW. She continues to be appropriate for HHPT at discharge.   Follow Up Recommendations  Home health PT;Supervision/Assistance - 24 hour     Equipment Recommendations  None recommended by PT    Recommendations for Other Services   NA     Precautions / Restrictions Precautions Precautions: Fall Precaution Comments: h/o dementia, requiring assist to ambulate right now.  Restrictions Weight Bearing Restrictions: No    Mobility  Bed Mobility Overal bed mobility: Needs Assistance Bed Mobility: Supine to Sit     Supine to sit: Min guard     General bed mobility comments: Min guard assist for pt safety  Transfers Overall transfer level: Needs assistance Equipment used: Rolling walker (2 wheeled) Transfers: Sit to/from Stand Sit to Stand: Min assist         General transfer comment: Min assist to support trunk to get to standing.  I had pt stand for ~20 seconds to make sure she felt well enough to keep going.   Ambulation/Gait Ambulation/Gait assistance: Min assist Ambulation Distance (Feet): 40 Feet Assistive device: Rolling  walker (2 wheeled) Gait Pattern/deviations: Step-through pattern;Shuffle;Trunk flexed Gait velocity: decreased Gait velocity interpretation: Below normal speed for age/gender General Gait Details: Pt is more steady with the RW today.  Right hand swelling is down and she is better able to grip assistive device today.  She still needs min assist for stability during gait.          Balance Overall balance assessment: Needs assistance Sitting-balance support: Feet supported;No upper extremity supported Sitting balance-Leahy Scale: Good     Standing balance support: Bilateral upper extremity supported Standing balance-Leahy Scale: Fair                      Cognition Arousal/Alertness: Lethargic (PT woke her up) Behavior During Therapy: WFL for tasks assessed/performed Overall Cognitive Status: History of cognitive impairments - at baseline Area of Impairment: Orientation;Following commands;Awareness;Problem solving;Memory       Following Commands: Follows one step commands consistently Safety/Judgement: Decreased awareness of safety;Decreased awareness of deficits   Problem Solving: Decreased initiation;Requires verbal cues             Pertinent Vitals/Pain Pain Assessment: Faces Faces Pain Scale: Hurts little more Pain Location: abdomen, where her drain inserts Pain Descriptors / Indicators: Nagging Pain Intervention(s): Limited activity within patient's tolerance;Monitored during session;Repositioned           PT Goals (current goals can now be found in the care plan section) Acute Rehab PT Goals Patient Stated Goal: unable to state, son says he would like for her to be independent  getting up and going back and forth to the bathroom like she was PTA Progress towards PT goals: Progressing toward goals    Frequency  Min 3X/week    PT Plan Current plan remains appropriate       End of Session Equipment Utilized During Treatment: Gait  belt;Oxygen Activity Tolerance: Patient limited by fatigue Patient left: in chair;with call bell/phone within reach;with nursing/sitter in room     Time: 4742-5956 PT Time Calculation (min): 25 min  Charges:  $Gait Training: 8-22 mins $Therapeutic Activity: 8-22 mins                     Glenna Brunkow B. [place], [place], DPT [phone]   02/04/2014, 2:47 PM

## 2014-02-04 NOTE — Progress Notes (Signed)
Routine EEG completed, results pending. 

## 2014-02-04 NOTE — Progress Notes (Signed)
Pt Restraints were removed this am and pt tolerated having the restraints off well with no problem. Pt remained calm during shift with sitter and family members at bedside well with no problem during shift. No s/s irritation or agitation noted or reported during shift. Reported off to incoming RN. Francis Gaines Karrigan Messamore RN.

## 2014-02-05 DIAGNOSIS — E785 Hyperlipidemia, unspecified: Secondary | ICD-10-CM

## 2014-02-05 DIAGNOSIS — R51 Headache: Secondary | ICD-10-CM

## 2014-02-05 DIAGNOSIS — E43 Unspecified severe protein-calorie malnutrition: Secondary | ICD-10-CM

## 2014-02-05 LAB — CULTURE, BLOOD (ROUTINE X 2)

## 2014-02-05 LAB — URINE CULTURE: Colony Count: >=100000

## 2014-02-05 MED ORDER — MORPHINE SULFATE 2 MG/ML IJ SOLN: 2.0000 mg | Freq: Once | INTRAMUSCULAR | Status: AC

## 2014-02-05 MED ORDER — VANCOMYCIN HCL 500 MG IV SOLR
500.0000 mg | Freq: Two times a day (BID) | INTRAVENOUS | Status: DC
  Filled 2014-02-05: qty 500

## 2014-02-05 MED ORDER — VANCOMYCIN HCL IN DEXTROSE 1-5 GM/200ML-% IV SOLN
1000.0000 mg | Freq: Once | INTRAVENOUS | Status: AC
  Administered 2014-02-05: 1000 mg via INTRAVENOUS
  Filled 2014-02-05: qty 200

## 2014-02-05 MED ORDER — ASPIRIN 325 MG PO TABS: 325.0000 mg | ORAL_TABLET | Freq: Every day | ORAL | Status: AC

## 2014-02-05 MED ORDER — CEFUROXIME AXETIL 500 MG PO TABS: 500.0000 mg | ORAL_TABLET | Freq: Two times a day (BID) | ORAL | Status: DC

## 2014-02-05 MED ORDER — TIZANIDINE HCL 2 MG PO TABS: 2.0000 mg | ORAL_TABLET | Freq: Every day | ORAL | Status: AC

## 2014-02-05 MED ORDER — MORPHINE SULFATE 2 MG/ML IJ SOLN
2.0000 mg | Freq: Once | INTRAMUSCULAR | Status: AC
  Administered 2014-02-05: 2 mg via INTRAVENOUS
  Filled 2014-02-05: qty 1

## 2014-02-05 NOTE — Progress Notes (Signed)
Contacted by lab 0035, informed of Positive blood culture gram positive cocci and clusters. Paged on call K.Kirby with information [phone].

## 2014-02-05 NOTE — Progress Notes (Signed)
ANTIBIOTIC CONSULT NOTE - INITIAL  Pharmacy Consult for vancomycin  Indication: G+ cocci in blood  No Known Allergies  Patient Measurements: Height: 5\' 6"  (167.6 cm) Weight: 134 lb 0.6 oz (60.8 kg) IBW/kg (Calculated) : 59.3 Adjusted Body Weight:   Vital Signs: Temp: 97.7 F (36.5 C) (08/13 2023) Temp src: Oral (08/13 2023) BP: 148/69 mmHg (08/13 2023) Pulse Rate: 82 (08/13 2023) Intake/Output from previous day: 08/13 0701 - 08/14 0700 In: 1200 [P.O.:1200] Out: -  Intake/Output from this shift:    Labs:  Recent Labs  02/02/14 1045 02/03/14 0509  WBC 8.6 8.0  HGB 10.8* 10.5*  PLT 146* 110*  CREATININE 0.44* 0.40*   Estimated Creatinine Clearance: 51.6 ml/min (by C-G formula based on Cr of 0.4). No results found for this basename: VANCOTROUGH, Corlis Leak, VANCORANDOM, [place], GENTPEAK, GENTRANDOM, TOBRATROUGH, TOBRAPEAK, TOBRARND, AMIKACINPEAK, AMIKACINTROU, AMIKACIN,  in the last 72 hours   Microbiology: Recent Results (from the past 720 hour(s))  MRSA PCR SCREENING     Status: None   Collection Time    01/15/14  5:19 PM      Result Value Ref Range Status   MRSA by PCR NEGATIVE  NEGATIVE Final   Comment:            The GeneXpert MRSA Assay (FDA     approved for NASAL specimens     only), is one component of a     comprehensive MRSA colonization     surveillance program. It is not     intended to diagnose MRSA     infection nor to guide or     monitor treatment for     MRSA infections.  CULTURE, BLOOD (ROUTINE X 2)     Status: None   Collection Time    01/15/14  7:13 PM      Result Value Ref Range Status   Specimen Description BLOOD ARM LEFT   Final   Special Requests BOTTLES DRAWN AEROBIC ONLY 3CC   Final   Culture  Setup Time     Final   Value: 01/16/2014 00:21     Performed at [facility]   Culture     Final   Value: NO GROWTH 5 DAYS     Performed at [facility]   Report Status 01/22/2014 FINAL   Final  CULTURE, BLOOD  (ROUTINE X 2)     Status: None   Collection Time    01/15/14  7:20 PM      Result Value Ref Range Status   Specimen Description BLOOD FOREARM LEFT   Final   Special Requests BOTTLES DRAWN AEROBIC ONLY 10CC   Final   Culture  Setup Time     Final   Value: 01/16/2014 00:21     Performed at [facility]   Culture     Final   Value: NO GROWTH 5 DAYS     Performed at [facility]   Report Status 01/22/2014 FINAL   Final  URINE CULTURE     Status: None   Collection Time    02/02/14 11:36 AM      Result Value Ref Range Status   Specimen Description URINE, RANDOM   Final   Special Requests NONE   Final   Culture  Setup Time     Final   Value: 02/02/2014 17:15     Performed at [place]     Final   Value: >=100,000 COLONIES/ML  Performed at [facility]     Final   Value: ESCHERICHIA COLI     Performed at [facility]   Report Status 02/05/2014 FINAL   Final   Organism ID, Bacteria ESCHERICHIA COLI   Final  CULTURE, BLOOD (ROUTINE X 2)     Status: None   Collection Time    02/02/14  2:10 PM      Result Value Ref Range Status   Specimen Description BLOOD LEFT ANTECUBITAL   Final   Special Requests BOTTLES DRAWN AEROBIC AND ANAEROBIC 10MLS   Final   Culture  Setup Time     Final   Value: 02/02/2014 18:51     Performed at [facility]   Culture     Final   Value: ESCHERICHIA COLI     Note: Gram Stain Report Called to,Read Back By and Verified With: JULIA HURRELBRINK@0948  ON [id number] BY [facility]     Performed at [facility]   Report Status PENDING   Incomplete  CULTURE, BLOOD (ROUTINE X 2)     Status: None   Collection Time    02/02/14  2:25 PM      Result Value Ref Range Status   Specimen Description BLOOD LEFT ARM   Final   Special Requests BOTTLES DRAWN AEROBIC AND ANAEROBIC 10MLS   Final   Culture  Setup Time     Final   Value: 02/02/2014 18:49     Performed at [facility]   Culture      Final   Value: ESCHERICHIA COLI     Note: Gram Stain Report Called to,Read Back By and Verified With: JULIA HURRELBRINK@0948  ON [id number] BY [facility]     Performed at [facility]   Report Status PENDING   Incomplete  CULTURE, BLOOD (ROUTINE X 2)     Status: None   Collection Time    02/03/14  7:02 PM      Result Value Ref Range Status   Specimen Description BLOOD LEFT ANTECUBITAL   Final   Special Requests BOTTLES DRAWN AEROBIC AND ANAEROBIC 5CC   Final   Culture  Setup Time     Final   Value: 02/03/2014 22:25     Performed at [facility]   Culture     Final   Value:        BLOOD CULTURE RECEIVED NO GROWTH TO DATE CULTURE WILL BE HELD FOR 5 DAYS BEFORE ISSUING A FINAL NEGATIVE REPORT     Performed at [facility]   Report Status PENDING   Incomplete  CULTURE, BLOOD (ROUTINE X 2)     Status: None   Collection Time    02/03/14  7:14 PM      Result Value Ref Range Status   Specimen Description BLOOD LEFT ARM   Final   Special Requests BOTTLES DRAWN AEROBIC AND ANAEROBIC 5CC   Final   Culture  Setup Time     Final   Value: 02/03/2014 22:25     Performed at [facility]   Culture     Final   Value: GRAM POSITIVE COCCI IN CLUSTERS     Note: Gram Stain Report Called to,Read Back By and Verified With: JEFF SIMPSON ON 02/05/2014 AT 12:38A BY Dennard Nip     Performed at [facility]   Report Status PENDING   Incomplete    Medical History: Past Medical History  Diagnosis Date  . Anxiety   .  Smoking   . Fibromyalgia   . Depression   . Hyperlipidemia   . Hypercalcemia   . Adrenal adenoma     Stable as of 05/2012 CT scan  . Chronic respiratory failure   . Coronary artery disease   . GERD (gastroesophageal reflux disease)   . Lymphedema syndrome, postmastectomy     RUE  . Breast CA 1970    rt breast  . CHF (congestive heart failure)   . COPD (chronic obstructive pulmonary disease)   . On home oxygen therapy     "3L; 24/7" (11/11/2013)  . DJD  (degenerative joint disease)   . Arthritis     "joints" (11/11/2013)  . Chronic back pain     "shoulders to lower back" (11/11/2013)  . Frequent UTI     Medications:  Prescriptions prior to admission  Medication Sig Dispense Refill  . albuterol (PROAIR HFA) 108 (90 BASE) MCG/ACT inhaler Inhale 2 puffs into the lungs every 4 (four) hours as needed for wheezing or shortness of breath.      Marland Kitchen alum & mag hydroxide-simeth (MAALOX/MYLANTA) 200-200-20 MG/5ML suspension Take 30 mLs by mouth daily as needed for indigestion or heartburn.      . busPIRone (BUSPAR) 10 MG tablet Take 10 mg by mouth 3 (three) times daily.       . furosemide (LASIX) 40 MG tablet Take 40 mg by mouth 2 (two) times daily.      Marland Kitchen HYDROcodone-acetaminophen (NORCO/VICODIN) 5-325 MG per tablet Take 1-2 tablets by mouth every 4 (four) hours as needed for moderate pain.      Marland Kitchen LORazepam (ATIVAN) 0.5 MG tablet Take 0.25-0.5 mg by mouth 2 (two) times daily. Take 1/2 tablet (0.25 mg) every morning and 1 tablet (0.5 mg) every night at bedtime      . mirtazapine (REMERON) 7.5 MG tablet Take 7.5 mg by mouth at bedtime.      Marland Kitchen morphine (MS CONTIN) 15 MG 12 hr tablet Take 15 mg by mouth every 12 (twelve) hours.       . ondansetron (ZOFRAN) 4 MG tablet Take 4 mg by mouth every 6 (six) hours as needed for nausea or vomiting.      . potassium chloride SA (K-DUR,KLOR-CON) 20 MEQ tablet Take 20 mEq by mouth 2 (two) times daily.      . pregabalin (LYRICA) 75 MG capsule Take 1 capsule (75 mg total) by mouth 2 (two) times daily.  60 capsule  3  . sertraline (ZOLOFT) 50 MG tablet Take 1 tablet (50 mg total) by mouth daily.  30 tablet  3  . thiamine (VITAMIN B-1) 100 MG tablet Take 100 mg by mouth daily.      Marland Kitchen tiotropium (SPIRIVA) 18 MCG inhalation capsule Place 18 mcg into inhaler and inhale daily as needed (shortness of breath/wheezing).       Marland Kitchen tiZANidine (ZANAFLEX) 4 MG tablet Take 4 mg by mouth at bedtime.       . triamcinolone cream (KENALOG)  0.1 % Apply 1 application topically 2 (two) times daily as needed (swelling in right arm).       Marland Kitchen albuterol (PROVENTIL) (2.5 MG/3ML) 0.083% nebulizer solution Take 2.5 mg by nebulization every 6 (six) hours as needed for wheezing or shortness of breath.      Marland Kitchen amoxicillin-clavulanate (AUGMENTIN) 875-125 MG per tablet Take 1 tablet by mouth every 12 (twelve) hours.  4 tablet  0  . ergocalciferol (VITAMIN D2) 50000 UNITS capsule Take 50,000 Units by mouth  every 14 (fourteen) days.       . fentaNYL (DURAGESIC - DOSED MCG/HR) 75 MCG/HR Place 75 mcg onto the skin every 3 (three) days.       . Multiple Vitamin (MULTIVITAMIN WITH MINERALS) TABS Take 1 tablet by mouth daily.  30 tablet  0  . omeprazole (PRILOSEC) 20 MG capsule Take 20 mg by mouth daily.        . polyethylene glycol (MIRALAX / GLYCOLAX) packet Take 17 g by mouth daily.      . prochlorperazine (COMPAZINE) 10 MG tablet       . verapamil (CALAN-SR) 120 MG CR tablet Take 1 tablet (120 mg total) by mouth daily.  30 tablet  0   Assessment: 80 yo with g+cocci in blood cx's. Vanc to begin now.   Goal of Therapy  vanc 15-82mcg/ml  Plan:  Vancomycin 1gm iv once then 500 mg iv q12h  Trough before 4th dose.   Curlene Dolphin 02/05/2014,3:18 AM

## 2014-02-05 NOTE — Progress Notes (Addendum)
Speech Language Pathology Treatment:    Patient Details Name: Theresa Blackwell MRN: [id number] DOB: 05-16-1931 Today's Date: 02/05/2014 Time: 9872-1587 SLP Time Calculation (min): 8 min  Assessment / Plan / Recommendation Clinical Impression  Skilled treatment session focused on addressing dysphagia goals.  Patient consumed regular textures and thin liqudis with efficient mastication and no overt s/s of aspiration following SLP set-up and increased wait time.  Additionally, sitter reports that patient consumed 100% of breakfast without difficulty.  Suspect patient at baseline function and has no further skilled SLP needs at this time.  SLP signing off.     HPI HPI: 80 y.o. female admitted to [facility] on 02/02/14 with AMS and SOB.  She has been dx with acute encepholopathy, UTI, TIA vs CVA (however, CT was suggestive of pons infart, however, MRI showed no acute infarct).  Pt has significant PMhx of  acute cholecystitis status post cholecystostomy tube, COPD/chronic respiratory failure -home O2-dependent on 3 L, chronic pain on multiple narcotics (fibromyalgia), Breast CA with right mastectomy and right arm lymphedema, CHF, and lumbar surgery.     Pertinent Vitals Pain Assessment: No/denies pain  SLP Plan  All goals met;Discharge SLP treatment due to (comment)    Recommendations Diet recommendations: Regular;Thin liquid Liquids provided via: Cup;Straw Medication Administration: Whole meds with liquid Supervision: Patient able to self feed;Intermittent supervision to cue for compensatory strategies Compensations: Slow rate;Small sips/bites Postural Changes and/or Swallow Maneuvers: Out of bed for meals;Seated upright 90 degrees              Oral Care Recommendations: Oral care BID Follow up Recommendations: None Plan: All goals met;Discharge SLP treatment due to (comment)    GO    [name], M.A., CCC-SLP [phone]  [place] 02/05/2014, 10:21 AM

## 2014-02-05 NOTE — Discharge Summary (Signed)
Physician Discharge Summary  Theresa Blackwell YPP:[id number] DOB: [birth year] DOA: 02/02/2014  PCP: Wenda Low, MD  Admit date: 02/02/2014 Discharge date: 02/05/2014  Time spent: 45 minutes  Recommendations for Outpatient Follow-up:  Patient will be discharged to home with home health to be resumed. Patient will need to continue physical therapy and occupational therapy according to her home health agency. Patient to continue her medications as prescribed. Patient should also follow up with her primary care physician within one week of discharge. Patient to continue her diet as tolerated.  Discharge Diagnoses:  Acute encephalopathy Sundowning Escherichia coli bacteremia/urinary tract infection Diastolic heart failure Paroxysmal atrial fibrillation Acute on chronic respiratory failure/COPD with hypercapnia History of acute cholecystitis status post colostomy tube Fibromyalgia/chronic pain Depression with anxiety GERD Chronic right upper extremity edema and lymphedema  Discharge Condition: Stable   Diet recommendation: Regular  Filed Weights   02/03/14 0500 02/04/14 0426 02/05/14 0300  Weight: 59 kg (130 lb 1.1 oz) 60.8 kg (134 lb 0.6 oz) 63 kg (138 lb 14.2 oz)    History of present illness:  on 02/02/2014  Theresa Blackwell is a 80 y.o. female multiple medical problems including history of acute cholecystitis status post cholecystostomy tube, COPD/chronic respiratory failure -home O2-dependent on 3 L, chronic pain on multiple narcotics, who presented with altered mental status and shortness of breath. She was treated with nebulizer treatments and her oxygenation improved on nasal cannula O2 per ED physician. Urinalysis was consistent with a UTI. In ED, patient was noted to have right upper extremity weakness, a CT scan of her brain was done and it showed low attenuation in the left side of pons concerning for an acute pontine infarct. Neurology was consulted, and admission to [facility]  requested for further evaluation and management.  In ED patient was started on empiric antibiotics for UTI. Upon admission, patient denied cough, chest pain. She was on nasal cannula O2, and denied shortness of breath.   Hospital Course:  Acute encephalopathy -Possibly also related to narcotics and multiple medications vs bacteremia/UTI -Patient appears read back at baseline, was given Narcan -CT head: Relative low attenuation in the left side of the pons concerning for an acute pontine infarct.  -MRI brain:No evidence of acute intracranial abnormality  -Neurology consulted and appreicated  -Carotid doppler: 1-39% internal carotid artery stenosis bilaterally. Vertebral arteries are patent with antegrade flow.  -Echocardiogram: EF 45-80%, grade 1 diastolic dysfunction  -LDL 51, hemoglobin A1c 5.1  -PT and OT consulted and recommended home health PT, tube/shower seat  -Continue aspirin for secondary stroke prevention  -EEG Impression: this is a normal awake and asleep EEG. Please, be aware that a normal EEG does not exclude the possibility of epilepsy.  -neurology signed off  [place]  -Patient appears to be alert and oriented during the day, however becomes agitated and encephalopathic at night.  -Likely secondary to multiple medications as well as her UTI/bacteremia.  -Will order small dose of Geodon at night.  -Patient will likely benefit from discharge to home. Trying to reach family.   E. coli bacteremia/Urinary tract infection  -UA: TNTC WBC, positive nitrites, large leukocytes, many bacteria  -Urine cultures: >100K ESCHERICHIA coli  -Blood cultures first showed gram variable rods, have resulted in Escherichia coli  -Was placed on ceftriaxone  -Spoke with infectious disease, Dr. Megan Salon, who recommended 10-14 day oral antibiotics pending susceptibility.  Patient will be discharged with Ceftin 500mg  BID for 10 days -repeat cultures on 8/12 show 1/2: gram + cocci in clusters (  spoke  with ID, likely a contaminant), sensitivities only to be followed by an outpatient  Chronic diastolic heart failure  -Currently compensated  -Continue to monitor daily weights, intake and output, Lasix   Paroxysmal atrial fibrillation  -Currently in sinus rhythm  -Continue verapamil and aspirin   Acute on chronic respiratory failure/COPD with hypercapnia  -Improved, likely secondary to narcotics  -Patient uses oxygen at home, will continue to maintain her saturations above 92%  -Continue nebulizer treatments  -Chest x-ray shows no acute infiltrate   History of acute cholecystitis status post colostomy tube  -LFTs within normal limits, tube appears to be draining well  -Dr.Tsuei's note of 8/10 surgery was being considered  -Will follow up with surgery   Fibromyalgia/chronic pain  -Patient on multiple medications  -Continue Zanaflex, Lyrica  -Patient had been given Narcan  -Will hold narcotics.  -Patient will need to speak with her primary care physician regarding her polypharmacy   Depression with anxiety  -Continue BuSpar, Ativan, Remeron, Zoloft   GERD  -Continue PPI   Chronic right upper extremity edema/lymphedema  -Currently has sleeve in place  Procedures: Carotid doppler: 1-39% internal carotid artery stenosis bilaterally. Vertebral arteries are patent with antegrade flow.   Echocardiogram Study Conclusions - Left ventricle: The cavity size was normal. Systolic function was mildly reduced. The estimated ejection fraction was in the range of 45% to 50%. Wall motion was normal; there were no regional wall motion abnormalities. Doppler parameters are consistent with abnormal left ventricular relaxation (grade 1 diastolic dysfunction). Doppler parameters are consistent with elevated ventricular end-diastolic filling pressure. - Aortic valve: Trileaflet; normal thickness leaflets. There was mild regurgitation. - Mitral valve: Calcified annulus. There was no  regurgitation. - Left atrium: The atrium was normal in size. - Right atrium: The atrium was normal in size. - Tricuspid valve: There was no regurgitation. - Pulmonic valve: There was trivial regurgitation. - Pulmonary arteries: PASP can&'t be assessed on current study. - Pericardium, extracardiac: There was no pericardial effusion. Impressions: There are frequent PACs that might be affecting LVEF assessment.  EEG Impression: this is a normal awake and asleep EEG. Please, be aware that a normal EEG does not exclude the possibility of epilepsy.   Consultations: Neurology  Infectious disease, Dr. Megan Salon, via phone  Discharge Exam: Filed Vitals:   02/05/14 0944  BP: 137/71  Pulse: 103  Temp: 98.4 F (36.9 C)  Resp: 20   Exam  General: Well developed, well nourished, NAD, appears stated age  HEENT: NCAT, mucous membranes moist.  Cardiovascular: S1 S2 auscultated, Regular rate and rhythm.  Respiratory: Clear to auscultation bilaterally with equal chest rise  Abdomen: Soft, nontender, nondistended, + bowel sounds  Extremities: warm dry without cyanosis clubbing or edema  Neuro: AAO to person, place, time, no focal deficits.  Discharge Instructions      Discharge Instructions   Discharge instructions    Complete by:  As directed   Patient will be discharged to home with home health to be resumed. Patient will need to continue physical therapy and occupational therapy according to her home health agency. Patient to continue her medications as prescribed. Patient should also follow up with her primary care physician within one week of discharge. Patient to continue her diet as tolerated.            Medication List    STOP taking these medications       amoxicillin-clavulanate 875-125 MG per tablet  Commonly known as:  AUGMENTIN  TAKE these medications       albuterol (2.5 MG/3ML) 0.083% nebulizer solution  Commonly known as:  PROVENTIL  Take 2.5 mg by nebulization  every 6 (six) hours as needed for wheezing or shortness of breath.     PROAIR HFA 108 (90 BASE) MCG/ACT inhaler  Generic drug:  albuterol  Inhale 2 puffs into the lungs every 4 (four) hours as needed for wheezing or shortness of breath.     alum & mag hydroxide-simeth 200-200-20 MG/5ML suspension  Commonly known as:  MAALOX/MYLANTA  Take 30 mLs by mouth daily as needed for indigestion or heartburn.     aspirin 325 MG tablet  Take 1 tablet (325 mg total) by mouth daily.     busPIRone 10 MG tablet  Commonly known as:  BUSPAR  Take 10 mg by mouth 3 (three) times daily.     cefUROXime 500 MG tablet  Commonly known as:  CEFTIN  Take 1 tablet (500 mg total) by mouth 2 (two) times daily with a meal.     ergocalciferol 50000 UNITS capsule  Commonly known as:  VITAMIN D2  Take 50,000 Units by mouth every 14 (fourteen) days.     fentaNYL 75 MCG/HR  Commonly known as:  DURAGESIC - dosed mcg/hr  Place 75 mcg onto the skin every 3 (three) days.     furosemide 40 MG tablet  Commonly known as:  LASIX  Take 40 mg by mouth 2 (two) times daily.     HYDROcodone-acetaminophen 5-325 MG per tablet  Commonly known as:  NORCO/VICODIN  Take 1-2 tablets by mouth every 4 (four) hours as needed for moderate pain.     LORazepam 0.5 MG tablet  Commonly known as:  ATIVAN  Take 0.25-0.5 mg by mouth 2 (two) times daily. Take 1/2 tablet (0.25 mg) every morning and 1 tablet (0.5 mg) every night at bedtime     mirtazapine 7.5 MG tablet  Commonly known as:  REMERON  Take 7.5 mg by mouth at bedtime.     morphine 15 MG 12 hr tablet  Commonly known as:  MS CONTIN  Take 15 mg by mouth every 12 (twelve) hours.     multivitamin with minerals Tabs tablet  Take 1 tablet by mouth daily.     omeprazole 20 MG capsule  Commonly known as:  PRILOSEC  Take 20 mg by mouth daily.     ondansetron 4 MG tablet  Commonly known as:  ZOFRAN  Take 4 mg by mouth every 6 (six) hours as needed for nausea or vomiting.      polyethylene glycol packet  Commonly known as:  MIRALAX / GLYCOLAX  Take 17 g by mouth daily.     potassium chloride SA 20 MEQ tablet  Commonly known as:  K-DUR,KLOR-CON  Take 20 mEq by mouth 2 (two) times daily.     pregabalin 75 MG capsule  Commonly known as:  LYRICA  Take 1 capsule (75 mg total) by mouth 2 (two) times daily.     prochlorperazine 10 MG tablet  Commonly known as:  COMPAZINE     sertraline 50 MG tablet  Commonly known as:  ZOLOFT  Take 1 tablet (50 mg total) by mouth daily.     thiamine 100 MG tablet  Commonly known as:  VITAMIN B-1  Take 100 mg by mouth daily.     tiotropium 18 MCG inhalation capsule  Commonly known as:  SPIRIVA  Place 18 mcg into inhaler and inhale daily as  needed (shortness of breath/wheezing).     tiZANidine 2 MG tablet  Commonly known as:  ZANAFLEX  Take 1 tablet (2 mg total) by mouth at bedtime.     triamcinolone cream 0.1 %  Commonly known as:  KENALOG  Apply 1 application topically 2 (two) times daily as needed (swelling in right arm).     verapamil 120 MG CR tablet  Commonly known as:  CALAN-SR  Take 1 tablet (120 mg total) by mouth daily.       No Known Allergies Follow-up Information   Follow up with HUSAIN,KARRAR, MD. Schedule an appointment as soon as possible for a visit in 1 week. [facility] followup)    Specialty:  Internal Medicine   Contact information:   [address], [address] [place] [address] [phone]        The results of significant diagnostics from this hospitalization (including imaging, microbiology, ancillary and laboratory) are listed below for reference.    Significant Diagnostic Studies: Dg Chest 2 View  02/02/2014   CLINICAL DATA:  Shortness some breath, confusion  EXAM: CHEST  2 VIEW  COMPARISON:  01/15/2014  FINDINGS: Cardiomediastinal silhouette is stable. No acute infiltrate or pleural effusion. No pulmonary edema. Mild degenerative changes thoracic spine. Stable right  apical pleural parenchymal scarring.  IMPRESSION: No active cardiopulmonary disease.   Electronically Signed   By: Lahoma Crocker M.D.   On: 02/02/2014 11:47   Ct Head Wo Contrast  02/02/2014   CLINICAL DATA:  Shortness of breath, altered mental status  EXAM: CT HEAD WITHOUT CONTRAST  TECHNIQUE: Contiguous axial images were obtained from the base of the skull through the vertex without intravenous contrast.  COMPARISON:  01/15/2014,  FINDINGS: There is no evidence of mass effect, midline shift, or extra-axial fluid collections. There is no evidence of a space-occupying lesion or intracranial hemorrhage. There is no evidence of a cortical-based area of acute infarction. There is relative low attenuation in the left side of the pons concerning for an acute pontine infarct. There is generalized cerebral atrophy. There is periventricular white matter low attenuation likely secondary to microangiopathy.  The ventricles and sulci are appropriate for the patient's age. The basal cisterns are patent.  Visualized portions of the orbits are unremarkable. The visualized portions of the paranasal sinuses and mastoid air cells are unremarkable. Cerebrovascular atherosclerotic calcifications are noted.  The osseous structures are unremarkable.  IMPRESSION: 1. Relative low attenuation in the left side of the pons concerning for an acute pontine infarct.   Electronically Signed   By: Kathreen Devoid   On: 02/02/2014 12:56   Ct Head Wo Contrast  01/15/2014   CLINICAL DATA:  Altered mental status. ; history of CHF, breast malignancy, and COPD  EXAM: CT HEAD WITHOUT CONTRAST  TECHNIQUE: Contiguous axial images were obtained from the base of the skull through the vertex without intravenous contrast.  COMPARISON:  Sinus CT scan dated May 31, 2003  FINDINGS: There is mild age appropriate diffuse cerebral atrophy with compensatory ventriculomegaly. There is symmetric decreased density in the deep white matter of both cerebral  hemispheres consistent with chronic small vessel ischemia. There is no acute intracranial hemorrhage nor evidence of an acute ischemic event. The cerebellum and brainstem are normal.  There is fluid in the right maxillary sinus. The observed bony margins of the sinus are normal. Fluid was present bilaterally in the maxillary sinuses on the previous CT scan. The other paranasal sinuses are clear. The mastoid air cells are  well pneumatized. There is no skull fracture.  IMPRESSION: 1. There is no acute ischemic or hemorrhagic event. 2. There are age related changes of chronic small vessel ischemia and acute atrophy. 3. Fluid in the right maxillary sinus in the absence of facial trauma is consistent with acute sinusitis. This may impact the patient's mental status. 4. There is no acute skull fracture.   Electronically Signed   By: David  Martinique   On: 01/15/2014 14:30   Mr Brain Wo Contrast  02/02/2014   CLINICAL DATA:  Altered mental status. Possible pontine infarct on head CT.  EXAM: MRI HEAD WITHOUT CONTRAST  MRA HEAD WITHOUT CONTRAST  TECHNIQUE: Multiplanar, multiecho pulse sequences of the brain and surrounding structures were obtained without intravenous contrast. Angiographic images of the head were obtained using MRA technique without contrast.  COMPARISON:  Head CT 02/02/2014  FINDINGS: MRI HEAD FINDINGS  Images are mildly to moderately degraded by motion. There is no evidence of acute infarct, intracranial hemorrhage, mass, midline shift, or extra-axial fluid collection. Patchy and confluent T2 hyperintensities in the subcortical and deep cerebral white matter and pons are nonspecific but compatible with moderate chronic small vessel ischemic disease. A small, remote infarct is noted in the left centrum semiovale. There is mild, age-appropriate cerebral atrophy.  Prior bilateral cataract extraction is noted. There is a small left mastoid effusion. Major intracranial vascular flow voids are grossly preserved.   MRA HEAD FINDINGS  Images are moderately to severely degraded by motion. Visualized distal vertebral arteries are patent and codominant. PICA origins are patent. Left AICA appears dominant. SCA origins are patent. Basilar artery is patent without stenosis. There is a small right posterior communicating artery. Right P1 segment appears hypoplastic. Proximal PCAs are grossly unremarkable, with limited branch vessel evaluation due to medication.  Internal carotid arteries are patent from skullbase to carotid termini. Proximal MCAs are patent without evidence of significant stenosis. M2 and more distal MCA branch vessels are not well evaluated due to motion. Left A1 segment is unremarkable. Right A1 is not well evaluated due to motion. A2 segments appear patent bilaterally, with detailed evaluation limited by motion.  IMPRESSION: 1. No evidence of acute intracranial abnormality. 2. Moderate chronic small vessel ischemic disease in the deep cerebral white matter and pons. 3. Limited head MRA due to motion. No evidence of major intracranial arterial occlusion.   Electronically Signed   By: Logan Bores   On: 02/02/2014 19:53   Dg Chest Port 1 View  01/15/2014   CLINICAL DATA:  Acute mental status changes.  Long-time smoker.  EXAM: PORTABLE CHEST - 1 VIEW  COMPARISON:  Two-view chest x-ray 11/11/2013, 02/02/2012, 05/30/2005. Portable chest x-ray 03/05/2012. CTA chest 06/05/2011.  FINDINGS: Patient rotated to the right. Cardiac silhouette mildly enlarged but stable. Thoracic aorta tortuous and atherosclerotic, unchanged. Right apical pleuroparenchymal scarring, unchanged when accounting for differences in technique. Lungs otherwise clear. No pneumothorax. Pulmonary vascularity normal. Costophrenic angles excluded from the image.  IMPRESSION: No acute cardiopulmonary disease. Stable right apical pleuroparenchymal scarring.   Electronically Signed   By: Evangeline Dakin M.D.   On: 01/15/2014 13:55   Mr Jodene Nam Head/brain Wo  Cm  02/02/2014   CLINICAL DATA:  Altered mental status. Possible pontine infarct on head CT.  EXAM: MRI HEAD WITHOUT CONTRAST  MRA HEAD WITHOUT CONTRAST  TECHNIQUE: Multiplanar, multiecho pulse sequences of the brain and surrounding structures were obtained without intravenous contrast. Angiographic images of the head were obtained using MRA technique without contrast.  COMPARISON:  Head CT 02/02/2014  FINDINGS: MRI HEAD FINDINGS  Images are mildly to moderately degraded by motion. There is no evidence of acute infarct, intracranial hemorrhage, mass, midline shift, or extra-axial fluid collection. Patchy and confluent T2 hyperintensities in the subcortical and deep cerebral white matter and pons are nonspecific but compatible with moderate chronic small vessel ischemic disease. A small, remote infarct is noted in the left centrum semiovale. There is mild, age-appropriate cerebral atrophy.  Prior bilateral cataract extraction is noted. There is a small left mastoid effusion. Major intracranial vascular flow voids are grossly preserved.  MRA HEAD FINDINGS  Images are moderately to severely degraded by motion. Visualized distal vertebral arteries are patent and codominant. PICA origins are patent. Left AICA appears dominant. SCA origins are patent. Basilar artery is patent without stenosis. There is a small right posterior communicating artery. Right P1 segment appears hypoplastic. Proximal PCAs are grossly unremarkable, with limited branch vessel evaluation due to medication.  Internal carotid arteries are patent from skullbase to carotid termini. Proximal MCAs are patent without evidence of significant stenosis. M2 and more distal MCA branch vessels are not well evaluated due to motion. Left A1 segment is unremarkable. Right A1 is not well evaluated due to motion. A2 segments appear patent bilaterally, with detailed evaluation limited by motion.  IMPRESSION: 1. No evidence of acute intracranial abnormality. 2.  Moderate chronic small vessel ischemic disease in the deep cerebral white matter and pons. 3. Limited head MRA due to motion. No evidence of major intracranial arterial occlusion.   Electronically Signed   By: Logan Bores   On: 02/02/2014 19:53    Microbiology: Recent Results (from the past 240 hour(s))  URINE CULTURE     Status: None   Collection Time    02/02/14 11:36 AM      Result Value Ref Range Status   Specimen Description URINE, RANDOM   Final   Special Requests NONE   Final   Culture  Setup Time     Final   Value: 02/02/2014 17:15     Performed at [facility]     Final   Value: >=100,000 COLONIES/ML     Performed at [facility]   Culture     Final   Value: ESCHERICHIA COLI     Performed at [facility]   Report Status 02/05/2014 FINAL   Final   Organism ID, Bacteria ESCHERICHIA COLI   Final  CULTURE, BLOOD (ROUTINE X 2)     Status: None   Collection Time    02/02/14  2:10 PM      Result Value Ref Range Status   Specimen Description BLOOD LEFT ANTECUBITAL   Final   Special Requests BOTTLES DRAWN AEROBIC AND ANAEROBIC 10MLS   Final   Culture  Setup Time     Final   Value: 02/02/2014 18:51     Performed at [facility]   Culture     Final   Value: ESCHERICHIA COLI     Note: SUSCEPTIBILITIES PERFORMED ON PREVIOUS CULTURE WITHIN THE LAST 5 DAYS.     Note: Gram Stain Report Called to,Read Back By and Verified With: JULIA HURRELBRINK@0948  ON [id number] BY [facility]     Performed at [facility]   Report Status 02/05/2014 FINAL   Final  CULTURE, BLOOD (ROUTINE X 2)     Status: None   Collection Time    02/02/14  2:25 PM  Result Value Ref Range Status   Specimen Description BLOOD LEFT ARM   Final   Special Requests BOTTLES DRAWN AEROBIC AND ANAEROBIC 10MLS   Final   Culture  Setup Time     Final   Value: 02/02/2014 18:49     Performed at [facility]   Culture     Final   Value: ESCHERICHIA COLI     Note:  Gram Stain Report Called to,Read Back By and Verified With: JULIA HURRELBRINK@0948  ON [id number] BY [facility]     Performed at [facility]   Report Status 02/05/2014 FINAL   Final   Organism ID, Bacteria ESCHERICHIA COLI   Final  CULTURE, BLOOD (ROUTINE X 2)     Status: None   Collection Time    02/03/14  7:02 PM      Result Value Ref Range Status   Specimen Description BLOOD LEFT ANTECUBITAL   Final   Special Requests BOTTLES DRAWN AEROBIC AND ANAEROBIC 5CC   Final   Culture  Setup Time     Final   Value: 02/03/2014 22:25     Performed at [facility]   Culture     Final   Value:        BLOOD CULTURE RECEIVED NO GROWTH TO DATE CULTURE WILL BE HELD FOR 5 DAYS BEFORE ISSUING A FINAL NEGATIVE REPORT     Performed at [facility]   Report Status PENDING   Incomplete  CULTURE, BLOOD (ROUTINE X 2)     Status: None   Collection Time    02/03/14  7:14 PM      Result Value Ref Range Status   Specimen Description BLOOD LEFT ARM   Final   Special Requests BOTTLES DRAWN AEROBIC AND ANAEROBIC 5CC   Final   Culture  Setup Time     Final   Value: 02/03/2014 22:25     Performed at [facility]   Culture     Final   Value: GRAM POSITIVE COCCI IN CLUSTERS     Note: Gram Stain Report Called to,Read Back By and Verified With: JEFF SIMPSON ON 02/05/2014 AT 12:38A BY [facility]     Performed at [facility]   Report Status PENDING   Incomplete     Labs: Basic Metabolic Panel:  Recent Labs Lab 02/02/14 1045 02/03/14 0509  NA 142 142  K 4.2 4.2  CL 98 102  CO2 35* 30  GLUCOSE 94 95  BUN 15 12  CREATININE 0.44* 0.40*  CALCIUM 10.1 9.5   Liver Function Tests:  Recent Labs Lab 02/02/14 1045  AST 12  ALT 9  ALKPHOS 65  BILITOT 0.8  PROT 6.4  ALBUMIN 3.3*   No results found for this basename: LIPASE, AMYLASE,  in the last 168 hours  Recent Labs Lab 02/02/14 1045  AMMONIA 29   CBC:  Recent Labs Lab 02/02/14 1045 02/03/14 0509  WBC 8.6 8.0   NEUTROABS 6.6  --   HGB 10.8* 10.5*  HCT 35.3* 34.3*  MCV 102.9* 102.4*  PLT 146* 110*   Cardiac Enzymes:  Recent Labs Lab 02/03/14 02/03/14 0509 02/03/14 0955  TROPONINI <0.30 <0.30 <0.30   BNP: BNP (last 3 results) No results found for this basename: PROBNP,  in the last 8760 hours CBG:  Recent Labs Lab 02/02/14 2225  GLUCAP 116*    Signed:  Casie Sturgeon  [facility] 02/05/2014, 10:55 AM

## 2014-02-05 NOTE — Procedures (Signed)
EEG report.  Brief clinical history: 80 year old lady with multiple medical problems presenting with altered mental status No prior history of frank epileptic seizures.  Technique: this is a 17 channel routine scalp EEG performed at the bedside with bipolar and monopolar montages arranged in accordance to the international 10/20 system of electrode placement. One channel was dedicated to EKG recording.  The study was performed during wakefulness, drowsiness, and stage 2 sleep. No activating procedures performed.  Description:In the wakeful state, the best background consisted of a medium amplitude, posterior dominant, well sustained, symmetric and reactive 8.5 Hz rhythm. Drowsiness demonstrated dropout of the alpha rhythm. Stage 2 sleep showed symmetric and synchronous sleep spindles without intermixed epileptiform discharges. No focal or generalized epileptiform discharges noted.  No slowing seen.  EKG showed sinus rhythm.  Impression: this is a normal awake and asleep EEG. Please, be aware that a normal EEG does not exclude the possibility of epilepsy.  Clinical correlation is advised.  Dorian Pod, MD

## 2014-02-05 NOTE — Progress Notes (Signed)
Patient d/c home today. Assessment remained unchanged prior to discharge.

## 2014-02-05 NOTE — Progress Notes (Signed)
NEURO HOSPITALIST PROGRESS NOTE   SUBJECTIVE:                                                                                                                        In good spirits. No new neurological developments. EEG normal. MRI/MRA brain unremarkable.   OBJECTIVE:                                                                                                                           Vital signs in last 24 hours: Temp:  [97.5 F (36.4 C)-97.7 F (36.5 C)] 97.7 F (36.5 C) (08/14 0517) Pulse Rate:  [60-88] 75 (08/14 0517) Resp:  [18-20] 18 (08/14 0517) BP: (99-148)/(42-69) 122/52 mmHg (08/14 0517) SpO2:  [93 %-100 %] 98 % (08/14 0517) Weight:  [63 kg (138 lb 14.2 oz)] 63 kg (138 lb 14.2 oz) (08/14 0300)  Intake/Output from previous day: 08/13 0701 - 08/14 0700 In: 1200 [P.O.:1200] Out: -  Intake/Output this shift:   Nutritional status: General  Past Medical History  Diagnosis Date  . Anxiety   . Smoking   . Fibromyalgia   . Depression   . Hyperlipidemia   . Hypercalcemia   . Adrenal adenoma     Stable as of 05/2012 CT scan  . Chronic respiratory failure   . Coronary artery disease   . GERD (gastroesophageal reflux disease)   . Lymphedema syndrome, postmastectomy     RUE  . Breast CA 1970    rt breast  . CHF (congestive heart failure)   . COPD (chronic obstructive pulmonary disease)   . On home oxygen therapy     "3L; 24/7" (11/11/2013)  . DJD (degenerative joint disease)   . Arthritis     "joints" (11/11/2013)  . Chronic back pain     "shoulders to lower back" (11/11/2013)  . Frequent UTI     Neurologic Exam:   Mental Status: Alert, oriented, thought content appropriate.  Speech fluent without evidence of aphasia.  Able to follow 3 step commands without difficulty. Cranial Nerves: II: Discs flat bilaterally; Visual fields grossly normal, pupils equal, round, reactive to light and accommodation III,IV, VI: ptosis  not present, extra-ocular motions intact bilaterally V,VII: smile symmetric, facial light touch  sensation normal bilaterally VIII: hearing normal bilaterally IX,X: gag reflex present XI: bilateral shoulder shrug XII: midline tongue extension without atrophy or fasciculations Motor: Moves all limbs symmetrically Tone and bulk:normal tone throughout; no atrophy noted Sensory: Pinprick and light touch intact throughout, bilaterally Deep Tendon Reflexes:  1+ all over Plantars: Right: downgoing   Left: downgoing Cerebellar: normal finger-to-nose,  normal heel-to-shin test Gait: No tested  Lab Results: Lab Results  Component Value Date/Time   CHOL 141 02/03/2014  5:09 AM   Lipid Panel  Recent Labs  02/03/14 0509  CHOL 141  TRIG 77  HDL 75  CHOLHDL 1.9  VLDL 15  LDLCALC 51    Studies/Results: No results found.  MEDICATIONS                                                                                                                        Scheduled: . aspirin  300 mg Rectal Daily   Or  . aspirin  325 mg Oral Daily  . busPIRone  10 mg Oral TID  . cefTRIAXone (ROCEPHIN) IVPB 1 gram/50 mL D5W  1 g Intravenous Q24H  . enoxaparin (LOVENOX) injection  40 mg Subcutaneous Q24H  . furosemide  40 mg Oral BID  . haloperidol lactate  0.5 mg Intravenous Once  . mirtazapine  7.5 mg Oral QHS  . multivitamin with minerals  1 tablet Oral Daily  . pantoprazole  40 mg Oral Daily  . polyethylene glycol  17 g Oral Daily  . potassium chloride SA  20 mEq Oral BID  . pregabalin  75 mg Oral BID  . sertraline  50 mg Oral Daily  . thiamine  100 mg Oral Daily  . tiotropium  18 mcg Inhalation Daily  . tiZANidine  2 mg Oral QHS  . vancomycin  500 mg Intravenous Q12H  . ziprasidone  20 mg Oral QHS    ASSESSMENT/PLAN:                                                                                                            80 year old lady with multiple medical problems admitted with  altered mental status likely secondary to multiple factors, including encephalopathy associated with hypoxia and hypercarbia as well as hepatic dysfunction, and sedating medications as well as possible infectious process. MRI/MRA brain and EEG unremarkable. Mental status much improved. No further neurological intervention needed at this moment. Neurology will sign off.  Dorian Pod, MD [facility] [phone]  02/05/2014, 9:26 AM

## 2014-02-05 NOTE — Progress Notes (Signed)
Chaplain followed up on spiritual care consult requesting advance directive. Pt's neighbor and grandson were present. I briefly explained the AD forms to pt and her neighbor, then gave the AD packet to the neighbor. She will give it to pt's son to complete. Pt wants her son to be HCPOA but wanted to take the forms home rather than fill them out here. Pt was quite cheerful and talkative and stated she would be going home today. Pt requested prayer. Pt and neighbor joined Emergency planning/management officer with chaplain and we had prayer together.

## 2014-02-05 NOTE — Progress Notes (Signed)
Occupational Therapy Treatment Patient Details Name: Theresa Blackwell MRN: [id number] DOB: 03-13-1931 Today's Date: 02/05/2014    History of present illness 80 y.o. female admitted to [facility] on 02/02/14 with AMS and SOB.  She has been dx with acute encepholopathy, UTI, TIA vs CVA (however, CT was suggestive of pons infart, however, MRI showed no acute infarct).  Pt has significant PMhx of  acute cholecystitis status post cholecystostomy tube, COPD/chronic respiratory failure -home O2-dependent on 3 L, chronic pain on multiple narcotics (fibromyalgia), Breast CA with right mastectomy and right arm lymphedema, CHF, and lumbar surgery.     OT comments  Patient tolerated OT treatment well. Patient engaged in functional mobility & transfers using RW. Patient with family/friends present in room. This therapist educated all on recommendation of 24/7 supervision, recommendation of DME (shower seat in walk-in shower & BSC over toilet seat). Patient left with chair alarm, sitter present, and family present. Also discussed importance of donning new compression sleeve > RUE, family states she has more at home.   At end of session, patient's grandson stopped therapist to discuss d/c plans. Grandson talked with therapist about patient not following commands and verbal cues from family when at home. Will follow-up with SW regarding this issue.   Follow Up Recommendations  No OT follow up    Equipment Recommendations  Tub/shower seat    Recommendations for Other Services  possible private sitter at home due to above statement.     Precautions / Restrictions Precautions Precautions: Fall Precaution Comments: h/o demeia Restrictions Weight Bearing Restrictions: No       Mobility  Transfers Overall transfer level: Needs assistance Equipment used: Rolling walker (2 wheeled) Transfers: Sit to/from [facility] Sit to Stand: Supervision Stand pivot transfers: Supervision             Balance Overall balance assessment: Needs assistance         Standing balance support: Bilateral upper extremity supported Standing balance-Leahy Scale: Fair                     ADL Overall ADL's : Needs assistance/impaired;At baseline     Grooming: Supervision/safety;Cueing for safety (met goal)                   Toilet Transfer: Supervision/safety;Cueing for safety (met goal)   Toileting- Clothing Manipulation and Hygiene: Supervision/safety;Cueing for safety (met goal)   Tub/ Shower Transfer: Supervision/safety;Cueing for safety (met goal) Tub/Shower Transfer Details (indicate cue type and reason):  (simulated walk-in shower transfer using RW)   General ADL Comments: Patient supervision for functional mobility/transfers using RW. Patient able to perform toilet transfer & tub/shower transfer with supervision as well as grooming task of washing hand standing at sink. Family present and this therapist educated family on safety, use of RW for functional mobility/transfers, use of DME.       Vision  No apparent visual impairments                          Cognition   Behavior During Therapy: WFL for tasks assessed/performed Overall Cognitive Status: History of cognitive impairments - at baseline          Following Commands: Follows multi-step commands consistently Safety/Judgement: Decreased awareness of safety Awareness: Emergent Problem Solving: Requires verbal cues General Comments: Dementia at baseline                 Pertinent Vitals/ Pain  Pain Assessment: No/denies pain         Frequency Min 2X/week     Progress Toward Goals  OT Goals(current goals can now be found in the care plan section)  Progress towards OT goals: Progressing toward goals  Acute Rehab OT Goals Patient Stated Goal:  (none stated) OT Goal Formulation: With patient/family  Plan Discharge plan remains appropriate          Activity Tolerance  Patient tolerated treatment well   Patient Left in chair;with call bell/phone within reach;with chair alarm set;with nursing/sitter in room;with family/visitor present     Time: 1054-1110 OT Time Calculation (min): 16 min  Charges: OT General Charges $OT Visit: 1 Procedure OT Treatments $Self Care/Home Management : 8-22 mins  Melodi Happel , MS, OTR/L, CLT [phone] 02/05/2014, 11:22 AM

## 2014-02-06 LAB — CULTURE, BLOOD (ROUTINE X 2)

## 2014-02-09 LAB — CULTURE, BLOOD (ROUTINE X 2): Culture: NO GROWTH

## 2014-02-12 ENCOUNTER — Telehealth (INDEPENDENT_AMBULATORY_CARE_PROVIDER_SITE_OTHER): Payer: Self-pay | Admitting: *Deleted

## 2014-02-12 NOTE — Telephone Encounter (Signed)
Caregiver, Kathlene November, called concerning pt.  He stated that pt has followed up with Dr. Deforest Hoyles and they have agreed that pt can have Lap chole surgery.  He states that Dr. Deforest Hoyles has communicated with Dr. Prince Solian regarding this.  They are asking when it can possibly be scheduled?  I advised Mr. Fatima Sanger that I would have to send Dr. Georgette Dover a message regarding this matter and make sure he has communicated with Dr. Deforest Hoyles and once we find out from Dr. Prince Solian, someone would return his call.  He asked that we either call him at [phone] or someone on her HIPPA due to the pt will forget.  Please advise!  Anderson Malta

## 2014-02-12 NOTE — Telephone Encounter (Signed)
I would like to see a copy of Dr. Sherilyn Cooter last office note.  This patient was just admitted to the hospital a week ago for some medical issues, so I'm not sure she will do well with surgery.

## 2014-02-15 NOTE — Telephone Encounter (Signed)
Tried to call Mr. Fatima Sanger back regarding pt.  Left a message for him to return our call.  Anderson Malta

## 2014-02-15 NOTE — Telephone Encounter (Signed)
Mr. Fatima Sanger returned my call and I explained to him that Dr. Georgette Dover wanted Dr. Sherilyn Cooter last office note.  He said, well Dr. Deforest Hoyles told us she could have the surgery.  I explained to Mr. Fatima Sanger that I understand that he verbally told them, but Dr. Prince Solian needs her office summary from Dr. Deforest Hoyles due to the fact that pt was recently hospitalized and with all her health problems, he needed to see documentation.  He verbalized understanding.  Anderson Malta

## 2014-02-19 ENCOUNTER — Other Ambulatory Visit (INDEPENDENT_AMBULATORY_CARE_PROVIDER_SITE_OTHER): Payer: Self-pay | Admitting: Surgery

## 2014-02-19 NOTE — Telephone Encounter (Signed)
Mr. Fatima Sanger called back again to see if Dr. Georgette Dover has had a chance to look over the office visit note from Dr. Lysle Rubens.  I explained to him that Dr. Georgette Dover has been out of the office this week, but Kenney Houseman has the note and will discuss with Dr. Georgette Dover today.  He verbalized understanding.  Anderson Malta

## 2014-02-19 NOTE — Progress Notes (Signed)
Dr. Lysle Rubens has sent a note of medical clearance for this patient - moderate risk for surgery.  We will schedule her laparoscopic cholecystectomy with cholangiogram.  This will be performed at [facility].  Leave the drain in place until surgery.  Theresa Blackwell. Georgette Dover, MD, [facility]  General/ Trauma Surgery  02/19/2014 1:19 PM

## 2014-02-24 ENCOUNTER — Emergency Department (HOSPITAL_COMMUNITY): Payer: Medicare Other

## 2014-02-24 ENCOUNTER — Encounter (HOSPITAL_COMMUNITY): Payer: Self-pay | Admitting: Emergency Medicine

## 2014-02-24 ENCOUNTER — Inpatient Hospital Stay (HOSPITAL_COMMUNITY)
Admission: EM | Admit: 2014-02-24 | Discharge: 2014-03-25 | DRG: 190 | Disposition: E | Payer: Medicare Other | Attending: Internal Medicine | Admitting: Internal Medicine

## 2014-02-24 DIAGNOSIS — F419 Anxiety disorder, unspecified: Secondary | ICD-10-CM

## 2014-02-24 DIAGNOSIS — M797 Fibromyalgia: Secondary | ICD-10-CM | POA: Diagnosis present

## 2014-02-24 DIAGNOSIS — K219 Gastro-esophageal reflux disease without esophagitis: Secondary | ICD-10-CM | POA: Diagnosis present

## 2014-02-24 DIAGNOSIS — E872 Acidosis, unspecified: Secondary | ICD-10-CM | POA: Diagnosis present

## 2014-02-24 DIAGNOSIS — R0603 Acute respiratory distress: Secondary | ICD-10-CM

## 2014-02-24 DIAGNOSIS — F3289 Other specified depressive episodes: Secondary | ICD-10-CM | POA: Diagnosis present

## 2014-02-24 DIAGNOSIS — F411 Generalized anxiety disorder: Secondary | ICD-10-CM | POA: Diagnosis present

## 2014-02-24 DIAGNOSIS — D3500 Benign neoplasm of unspecified adrenal gland: Secondary | ICD-10-CM | POA: Diagnosis present

## 2014-02-24 DIAGNOSIS — J440 Chronic obstructive pulmonary disease with acute lower respiratory infection: Secondary | ICD-10-CM | POA: Diagnosis present

## 2014-02-24 DIAGNOSIS — J9620 Acute and chronic respiratory failure, unspecified whether with hypoxia or hypercapnia: Secondary | ICD-10-CM | POA: Diagnosis present

## 2014-02-24 DIAGNOSIS — I5042 Chronic combined systolic (congestive) and diastolic (congestive) heart failure: Secondary | ICD-10-CM | POA: Diagnosis present

## 2014-02-24 DIAGNOSIS — Z853 Personal history of malignant neoplasm of breast: Secondary | ICD-10-CM

## 2014-02-24 DIAGNOSIS — D649 Anemia, unspecified: Secondary | ICD-10-CM | POA: Diagnosis present

## 2014-02-24 DIAGNOSIS — R0689 Other abnormalities of breathing: Secondary | ICD-10-CM

## 2014-02-24 DIAGNOSIS — F0390 Unspecified dementia without behavioral disturbance: Secondary | ICD-10-CM | POA: Diagnosis present

## 2014-02-24 DIAGNOSIS — Z66 Do not resuscitate: Secondary | ICD-10-CM | POA: Diagnosis present

## 2014-02-24 DIAGNOSIS — Z515 Encounter for palliative care: Secondary | ICD-10-CM | POA: Diagnosis not present

## 2014-02-24 DIAGNOSIS — J209 Acute bronchitis, unspecified: Secondary | ICD-10-CM | POA: Diagnosis present

## 2014-02-24 DIAGNOSIS — Z800 Family history of malignant neoplasm of digestive organs: Secondary | ICD-10-CM

## 2014-02-24 DIAGNOSIS — J811 Chronic pulmonary edema: Secondary | ICD-10-CM | POA: Diagnosis present

## 2014-02-24 DIAGNOSIS — E871 Hypo-osmolality and hyponatremia: Secondary | ICD-10-CM | POA: Diagnosis not present

## 2014-02-24 DIAGNOSIS — E785 Hyperlipidemia, unspecified: Secondary | ICD-10-CM | POA: Diagnosis present

## 2014-02-24 DIAGNOSIS — K828 Other specified diseases of gallbladder: Secondary | ICD-10-CM | POA: Diagnosis present

## 2014-02-24 DIAGNOSIS — I2510 Atherosclerotic heart disease of native coronary artery without angina pectoris: Secondary | ICD-10-CM | POA: Diagnosis present

## 2014-02-24 DIAGNOSIS — G9340 Encephalopathy, unspecified: Secondary | ICD-10-CM

## 2014-02-24 DIAGNOSIS — G894 Chronic pain syndrome: Secondary | ICD-10-CM | POA: Diagnosis present

## 2014-02-24 DIAGNOSIS — Z720 Tobacco use: Secondary | ICD-10-CM | POA: Diagnosis present

## 2014-02-24 DIAGNOSIS — Z981 Arthrodesis status: Secondary | ICD-10-CM

## 2014-02-24 DIAGNOSIS — R0682 Tachypnea, not elsewhere classified: Secondary | ICD-10-CM

## 2014-02-24 DIAGNOSIS — Z9981 Dependence on supplemental oxygen: Secondary | ICD-10-CM

## 2014-02-24 DIAGNOSIS — M1990 Unspecified osteoarthritis, unspecified site: Secondary | ICD-10-CM | POA: Diagnosis present

## 2014-02-24 DIAGNOSIS — J441 Chronic obstructive pulmonary disease with (acute) exacerbation: Principal | ICD-10-CM | POA: Diagnosis present

## 2014-02-24 DIAGNOSIS — R4182 Altered mental status, unspecified: Secondary | ICD-10-CM

## 2014-02-24 DIAGNOSIS — J810 Acute pulmonary edema: Secondary | ICD-10-CM

## 2014-02-24 DIAGNOSIS — K810 Acute cholecystitis: Secondary | ICD-10-CM | POA: Diagnosis present

## 2014-02-24 DIAGNOSIS — T17508A Unspecified foreign body in bronchus causing other injury, initial encounter: Secondary | ICD-10-CM | POA: Diagnosis present

## 2014-02-24 DIAGNOSIS — IMO0001 Reserved for inherently not codable concepts without codable children: Secondary | ICD-10-CM | POA: Diagnosis present

## 2014-02-24 DIAGNOSIS — F329 Major depressive disorder, single episode, unspecified: Secondary | ICD-10-CM | POA: Diagnosis present

## 2014-02-24 DIAGNOSIS — T85518A Breakdown (mechanical) of other gastrointestinal prosthetic devices, implants and grafts, initial encounter: Secondary | ICD-10-CM

## 2014-02-24 DIAGNOSIS — IMO0002 Reserved for concepts with insufficient information to code with codable children: Secondary | ICD-10-CM | POA: Diagnosis present

## 2014-02-24 DIAGNOSIS — G9341 Metabolic encephalopathy: Secondary | ICD-10-CM | POA: Diagnosis present

## 2014-02-24 DIAGNOSIS — R0600 Dyspnea, unspecified: Secondary | ICD-10-CM

## 2014-02-24 DIAGNOSIS — I4891 Unspecified atrial fibrillation: Secondary | ICD-10-CM | POA: Diagnosis present

## 2014-02-24 DIAGNOSIS — I5043 Acute on chronic combined systolic (congestive) and diastolic (congestive) heart failure: Secondary | ICD-10-CM | POA: Diagnosis present

## 2014-02-24 DIAGNOSIS — R1011 Right upper quadrant pain: Secondary | ICD-10-CM

## 2014-02-24 DIAGNOSIS — Z8249 Family history of ischemic heart disease and other diseases of the circulatory system: Secondary | ICD-10-CM

## 2014-02-24 DIAGNOSIS — E875 Hyperkalemia: Secondary | ICD-10-CM | POA: Diagnosis present

## 2014-02-24 DIAGNOSIS — F17200 Nicotine dependence, unspecified, uncomplicated: Secondary | ICD-10-CM | POA: Diagnosis present

## 2014-02-24 DIAGNOSIS — J9622 Acute and chronic respiratory failure with hypercapnia: Secondary | ICD-10-CM

## 2014-02-24 DIAGNOSIS — I509 Heart failure, unspecified: Secondary | ICD-10-CM | POA: Diagnosis present

## 2014-02-24 DIAGNOSIS — R109 Unspecified abdominal pain: Secondary | ICD-10-CM | POA: Diagnosis present

## 2014-02-24 LAB — TROPONIN I

## 2014-02-24 LAB — URINALYSIS, ROUTINE W REFLEX MICROSCOPIC
Glucose, UA: 100 mg/dL — AB
Hgb urine dipstick: NEGATIVE
Ketones, ur: NEGATIVE mg/dL
Leukocytes, UA: NEGATIVE
Nitrite: NEGATIVE
PH: 5.5 (ref 5.0–8.0)
Protein, ur: NEGATIVE mg/dL
Specific Gravity, Urine: 1.017 (ref 1.005–1.030)
Urobilinogen, UA: 1 mg/dL (ref 0.0–1.0)

## 2014-02-24 LAB — CBC WITH DIFFERENTIAL/PLATELET
BASOS PCT: 0 % (ref 0–1)
Basophils Absolute: 0 10*3/uL (ref 0.0–0.1)
Eosinophils Absolute: 0 10*3/uL (ref 0.0–0.7)
Eosinophils Relative: 0 % (ref 0–5)
HEMATOCRIT: 37 % (ref 36.0–46.0)
Hemoglobin: 10.9 g/dL — ABNORMAL LOW (ref 12.0–15.0)
Lymphocytes Relative: 9 % — ABNORMAL LOW (ref 12–46)
Lymphs Abs: 0.8 10*3/uL (ref 0.7–4.0)
MCH: 30.2 pg (ref 26.0–34.0)
MCHC: 29.5 g/dL — ABNORMAL LOW (ref 30.0–36.0)
MCV: 102.5 fL — ABNORMAL HIGH (ref 78.0–100.0)
Monocytes Absolute: 0.8 10*3/uL (ref 0.1–1.0)
Monocytes Relative: 10 % (ref 3–12)
NEUTROS ABS: 7 10*3/uL (ref 1.7–7.7)
NEUTROS PCT: 81 % — AB (ref 43–77)
Platelets: 153 10*3/uL (ref 150–400)
RBC: 3.61 MIL/uL — AB (ref 3.87–5.11)
RDW: 14.2 % (ref 11.5–15.5)
WBC: 8.6 10*3/uL (ref 4.0–10.5)

## 2014-02-24 LAB — COMPREHENSIVE METABOLIC PANEL
ALBUMIN: 3.3 g/dL — AB (ref 3.5–5.2)
ALK PHOS: 80 U/L (ref 39–117)
ALT: 16 U/L (ref 0–35)
AST: 23 U/L (ref 0–37)
Anion gap: 6 (ref 5–15)
BILIRUBIN TOTAL: 0.4 mg/dL (ref 0.3–1.2)
BUN: 15 mg/dL (ref 6–23)
CHLORIDE: 95 meq/L — AB (ref 96–112)
CO2: 33 meq/L — AB (ref 19–32)
CREATININE: 0.59 mg/dL (ref 0.50–1.10)
Calcium: 10 mg/dL (ref 8.4–10.5)
GFR calc Af Amer: >90 mL/min (ref >90)
GFR, EST NON AFRICAN AMERICAN: 84 mL/min — AB (ref >90)
Glucose, Bld: 154 mg/dL — ABNORMAL HIGH (ref 70–99)
POTASSIUM: 5.2 meq/L (ref 3.7–5.3)
Sodium: 134 mEq/L — ABNORMAL LOW (ref 137–147)
Total Protein: 6.8 g/dL (ref 6.0–8.3)

## 2014-02-24 LAB — BLOOD GAS, ARTERIAL
ACID-BASE EXCESS: 1.9 mmol/L (ref 0.0–2.0)
Bicarbonate: 30.9 mEq/L — ABNORMAL HIGH (ref 20.0–24.0)
DRAWN BY: 36989
Delivery systems: POSITIVE
Expiratory PAP: 5
FIO2: 0.7 %
Inspiratory PAP: 12
O2 Saturation: 95.6 %
PH ART: 7.105 — AB (ref 7.350–7.450)
PO2 ART: 90.7 mmHg (ref 80.0–100.0)
Patient temperature: 98.6
TCO2: 34.1 mmol/L (ref 0–100)
pCO2 arterial: 103 mmHg (ref 35.0–45.0)

## 2014-02-24 LAB — PROCALCITONIN: Procalcitonin: <0.1 ng/mL

## 2014-02-24 LAB — I-STAT TROPONIN, ED: Troponin i, poc: 0.02 ng/mL (ref 0.00–0.08)

## 2014-02-24 LAB — MRSA PCR SCREENING: MRSA by PCR: NEGATIVE

## 2014-02-24 LAB — I-STAT CG4 LACTIC ACID, ED: LACTIC ACID, VENOUS: 0.85 mmol/L (ref 0.5–2.2)

## 2014-02-24 MED ORDER — IOHEXOL 350 MG/ML SOLN
100.0000 mL | Freq: Once | INTRAVENOUS | Status: AC | PRN
  Administered 2014-02-24: 100 mL via INTRAVENOUS

## 2014-02-24 MED ORDER — VANCOMYCIN HCL 500 MG IV SOLR
500.0000 mg | Freq: Two times a day (BID) | INTRAVENOUS | Status: DC
  Administered 2014-02-25 (×2): 500 mg via INTRAVENOUS
  Filled 2014-02-24 (×3): qty 500

## 2014-02-24 MED ORDER — SODIUM CHLORIDE 0.9 % IV BOLUS (SEPSIS)
30.0000 mL/kg | Freq: Once | INTRAVENOUS | Status: AC
  Administered 2014-02-24: 1824 mL via INTRAVENOUS

## 2014-02-24 MED ORDER — ONDANSETRON HCL 4 MG/2ML IJ SOLN: 4.0000 mg | Freq: Four times a day (QID) | INTRAMUSCULAR | Status: DC | PRN

## 2014-02-24 MED ORDER — ALBUTEROL SULFATE (2.5 MG/3ML) 0.083% IN NEBU
2.5000 mg | INHALATION_SOLUTION | Freq: Four times a day (QID) | RESPIRATORY_TRACT | Status: DC
  Administered 2014-02-25 – 2014-02-26 (×5): 2.5 mg via RESPIRATORY_TRACT
  Filled 2014-02-24 (×5): qty 3

## 2014-02-24 MED ORDER — ONDANSETRON HCL 4 MG PO TABS: 4.0000 mg | ORAL_TABLET | Freq: Four times a day (QID) | ORAL | Status: DC | PRN

## 2014-02-24 MED ORDER — DILTIAZEM HCL 25 MG/5ML IV SOLN: 5.0000 mg | Freq: Once | INTRAVENOUS | Status: DC

## 2014-02-24 MED ORDER — IOHEXOL 300 MG/ML  SOLN
25.0000 mL | Freq: Once | INTRAMUSCULAR | Status: DC | PRN
Start: 2014-02-24 — End: 2014-02-24

## 2014-02-24 MED ORDER — VANCOMYCIN HCL IN DEXTROSE 1-5 GM/200ML-% IV SOLN
1000.0000 mg | Freq: Once | INTRAVENOUS | Status: AC
  Administered 2014-02-24: 1000 mg via INTRAVENOUS
  Filled 2014-02-24: qty 200

## 2014-02-24 MED ORDER — PIPERACILLIN-TAZOBACTAM 3.375 G IVPB 30 MIN
3.3750 g | Freq: Once | INTRAVENOUS | Status: AC
  Administered 2014-02-24: 3.375 g via INTRAVENOUS
  Filled 2014-02-24: qty 50

## 2014-02-24 MED ORDER — ACETYLCYSTEINE 20 % IN SOLN
4.0000 mL | Freq: Four times a day (QID) | RESPIRATORY_TRACT | Status: DC
Start: 2014-02-24 — End: 2014-02-25
  Administered 2014-02-25 (×3): 4 mL via RESPIRATORY_TRACT
  Filled 2014-02-24 (×7): qty 4

## 2014-02-24 MED ORDER — SODIUM CHLORIDE 0.9 % IV SOLN
1000.0000 mL | INTRAVENOUS | Status: DC
  Administered 2014-02-24: 1000 mL via INTRAVENOUS

## 2014-02-24 MED ORDER — PIPERACILLIN-TAZOBACTAM 3.375 G IVPB
3.3750 g | Freq: Three times a day (TID) | INTRAVENOUS | Status: DC
  Administered 2014-02-24 – 2014-02-25 (×3): 3.375 g via INTRAVENOUS
  Filled 2014-02-24 (×5): qty 50

## 2014-02-24 MED ORDER — ENOXAPARIN SODIUM 40 MG/0.4ML ~~LOC~~ SOLN
40.0000 mg | SUBCUTANEOUS | Status: DC
  Administered 2014-02-24: 40 mg via SUBCUTANEOUS
  Filled 2014-02-24 (×2): qty 0.4

## 2014-02-24 MED ORDER — METHYLPREDNISOLONE SODIUM SUCC 125 MG IJ SOLR
80.0000 mg | Freq: Four times a day (QID) | INTRAMUSCULAR | Status: DC
  Administered 2014-02-24 – 2014-02-25 (×3): 80 mg via INTRAVENOUS
  Filled 2014-02-24 (×3): qty 1.28
  Filled 2014-02-24 (×2): qty 2
  Filled 2014-02-24: qty 1.28
  Filled 2014-02-24 (×2): qty 2

## 2014-02-24 MED ORDER — ALBUTEROL SULFATE (2.5 MG/3ML) 0.083% IN NEBU: 2.5000 mg | INHALATION_SOLUTION | RESPIRATORY_TRACT | Status: DC | PRN

## 2014-02-24 MED ORDER — LORAZEPAM 2 MG/ML IJ SOLN
0.5000 mg | Freq: Once | INTRAMUSCULAR | Status: AC
  Administered 2014-02-24: 0.5 mg via INTRAVENOUS
  Filled 2014-02-24: qty 1

## 2014-02-24 NOTE — ED Provider Notes (Signed)
CSN: [id number]     Arrival date & time 03/14/2014  1034 History   First MD Initiated Contact with Patient 03/21/2014 1034     Chief Complaint  Patient presents with  . Blood Infection    elevated HR , tachypnic, confusion     (Consider location/radiation/quality/duration/timing/severity/associated sxs/prior Treatment) HPI Comments: Patient is an 80 year old female with a past medical history of COPD, CHF, GERD, CAD, chronic respiratory failure, hyperlipidemia, depression, fibromyalgia, anxiety, chronic urinary tract infections and breast cancer who presents to the emergency department via EMS from home after complaining to her family of right-sided abdominal pain beginning earlier this morning. Patient cannot recall that she was complaining of abdominal pain and at this time denies any pain. Patient has a drain in her gallbladder that has been present for the past 4 months, unknown when the strain is going to be removed. Family reported to EMS that the drainage in the drain is slightly darker than normal. It is noted she was tachycardic, tachypneic and appeared short of breath on EMS arrival. Heart rate was fluctuating from 107-170 with respirations in the 30s. She arrived on 15 L of oxygen. She is alert to person and place but not time which is abnormal for her. Patient doesn't course shortness of breath. She is normally alert and oriented x3. Level V caveat due to altered mental status.  The history is provided by the EMS personnel.    Past Medical History  Diagnosis Date  . Anxiety   . Smoking   . Fibromyalgia   . Depression   . Hyperlipidemia   . Hypercalcemia   . Adrenal adenoma     Stable as of 05/2012 CT scan  . Chronic respiratory failure   . Coronary artery disease   . GERD (gastroesophageal reflux disease)   . Lymphedema syndrome, postmastectomy     RUE  . Breast CA 1970    rt breast  . CHF (congestive heart failure)   . COPD (chronic obstructive pulmonary disease)   . On  home oxygen therapy     "3L; 24/7" (11/11/2013)  . DJD (degenerative joint disease)   . Arthritis     "joints" (11/11/2013)  . Chronic back pain     "shoulders to lower back" (11/11/2013)  . Frequent UTI    Past Surgical History  Procedure Laterality Date  . Abdominal hysterectomy    . Appendectomy    . Squamous cell carcinoma excision      Floor of mouth  . Colonoscopy w/ biopsies and polypectomy    . Nasal sinus surgery  1994  . Eus  04/03/2012    Procedure: UPPER ENDOSCOPIC ULTRASOUND (EUS) LINEAR;  Surgeon: Milus Banister, MD;  Location: [facility];  Service: Endoscopy;  Laterality: N/A;  radial linear  . Mastectomy, radical Right 1970  . Breast biopsy Right ~ 1970  . Refractive surgery Bilateral ~ 2012  . Back surgery    . Posterior lumbar fusion  ?1970's   Family History  Problem Relation Age of Onset  . Colon cancer Brother   . Heart disease Father    History  Substance Use Topics  . Smoking status: Current Every Day Smoker -- 1.00 packs/day for 64 years    Types: Cigarettes  . Smokeless tobacco: Never Used  . Alcohol Use: No     Comment: stopped in 2013 but drank 1/5 every couple of days   OB History   Grav Para Term Preterm Abortions TAB SAB Ect Mult Living  Review of Systems  Unable to perform ROS: Mental status change  Respiratory: Positive for shortness of breath.       Allergies  Review of patient's allergies indicates no known allergies.  Home Medications   Prior to Admission medications   Medication Sig Start Date End Date Taking? Authorizing Provider  albuterol (PROAIR HFA) 108 (90 BASE) MCG/ACT inhaler Inhale 2 puffs into the lungs every 4 (four) hours as needed for wheezing or shortness of breath.   Yes Historical Provider, MD  albuterol (PROVENTIL) (2.5 MG/3ML) 0.083% nebulizer solution Take 2.5 mg by nebulization every 6 (six) hours as needed for wheezing or shortness of breath.   Yes Historical Provider, MD  alum & mag  hydroxide-simeth (MAALOX/MYLANTA) 200-200-20 MG/5ML suspension Take 30 mLs by mouth daily as needed for indigestion or heartburn.   Yes Historical Provider, MD  aspirin 325 MG tablet Take 1 tablet (325 mg total) by mouth daily. 02/05/14  Yes Maryann Mikhail, DO  busPIRone (BUSPAR) 10 MG tablet Take 10 mg by mouth 3 (three) times daily.  01/16/14  Yes Historical Provider, MD  ergocalciferol (VITAMIN D2) 50000 UNITS capsule Take 50,000 Units by mouth every 14 (fourteen) days.    Yes Historical Provider, MD  fentaNYL (DURAGESIC - DOSED MCG/HR) 75 MCG/HR Place 75 mcg onto the skin every 3 (three) days.  10/26/13  Yes Historical Provider, MD  furosemide (LASIX) 40 MG tablet Take 40 mg by mouth 2 (two) times daily.   Yes Historical Provider, MD  HYDROcodone-acetaminophen (NORCO/VICODIN) 5-325 MG per tablet Take 1-2 tablets by mouth every 4 (four) hours as needed for moderate pain.   Yes Historical Provider, MD  LORazepam (ATIVAN) 0.5 MG tablet Take 0.25-0.5 mg by mouth 2 (two) times daily. Take 1/2 tablet (0.25 mg) every morning and 1 tablet (0.5 mg) every night at bedtime   Yes Historical Provider, MD  mirtazapine (REMERON) 7.5 MG tablet Take 7.5 mg by mouth at bedtime.   Yes Historical Provider, MD  morphine (MS CONTIN) 15 MG 12 hr tablet Take 15 mg by mouth every 12 (twelve) hours.  01/28/14  Yes Historical Provider, MD  Multiple Vitamin (MULTIVITAMIN WITH MINERALS) TABS Take 1 tablet by mouth daily. 03/09/12  Yes Wenda Low, MD  omeprazole (PRILOSEC) 20 MG capsule Take 20 mg by mouth daily.     Yes Historical Provider, MD  ondansetron (ZOFRAN) 4 MG tablet Take 4 mg by mouth every 6 (six) hours as needed for nausea or vomiting.   Yes Historical Provider, MD  polyethylene glycol (MIRALAX / GLYCOLAX) packet Take 17 g by mouth daily.   Yes Historical Provider, MD  potassium chloride SA (K-DUR,KLOR-CON) 20 MEQ tablet Take 20 mEq by mouth 2 (two) times daily.   Yes Historical Provider, MD  pregabalin (LYRICA) 75  MG capsule Take 1 capsule (75 mg total) by mouth 2 (two) times daily. 03/10/12  Yes Wenda Low, MD  prochlorperazine (COMPAZINE) 10 MG tablet Take 10 mg by mouth every 6 (six) hours as needed for nausea.  02/01/14  Yes Historical Provider, MD  sertraline (ZOLOFT) 50 MG tablet Take 1 tablet (50 mg total) by mouth daily. 11/13/13  Yes Wenda Low, MD  thiamine (VITAMIN B-1) 100 MG tablet Take 100 mg by mouth daily.   Yes Historical Provider, MD  tiotropium (SPIRIVA) 18 MCG inhalation capsule Place 18 mcg into inhaler and inhale daily as needed (shortness of breath/wheezing).    Yes Historical Provider, MD  tiZANidine (ZANAFLEX) 2 MG tablet Take 1 tablet (  2 mg total) by mouth at bedtime. 02/05/14  Yes Maryann Mikhail, DO  triamcinolone cream (KENALOG) 0.1 % Apply 1 application topically 2 (two) times daily as needed (swelling in right arm).    Yes Historical Provider, MD  verapamil (CALAN-SR) 120 MG CR tablet Take 1 tablet (120 mg total) by mouth daily. 11/13/13  Yes Wenda Low, MD   BP 138/81  Pulse 145  Temp(Src) 98.8 F (37.1 C) (Axillary)  Resp 30  Ht 5\' 6"  (1.676 m)  Wt 134 lb (60.782 kg)  BMI 21.64 kg/m2  SpO2 100% Physical Exam  Nursing note and vitals reviewed. Constitutional: She appears well-developed and well-nourished. She appears ill. She appears distressed (moderate).  HENT:  Head: Normocephalic and atraumatic.  Mouth/Throat: Oropharynx is clear and moist.  Eyes: Conjunctivae are normal.  Neck: Normal range of motion. Neck supple.  Cardiovascular: Normal heart sounds.  A regularly irregular rhythm present. Tachycardia present.   Pulmonary/Chest:  Moderate respiratory distress. Tachypneic. Scattered rhonchi.  Abdominal: Soft. Bowel sounds are normal. There is no tenderness.    Musculoskeletal: Normal range of motion. She exhibits no edema.  Neurological: She is alert. GCS eye subscore is 4. GCS verbal subscore is 4. GCS motor subscore is 6.  Alert and oriented to  person and place but not time.  Skin: Skin is warm. She is diaphoretic.  Psychiatric: She has a normal mood and affect. Her behavior is normal.    ED Course  Procedures (including critical care time) CRITICAL CARE Performed by: Michele Mcalpine   Total critical care time: 90 minutes  Critical care time was exclusive of separately billable procedures and treating other patients.  Critical care was necessary to treat or prevent imminent or life-threatening deterioration.  Critical care was time spent personally by me on the following activities: development of treatment plan with patient and/or surrogate as well as nursing, discussions with consultants, evaluation of patient's response to treatment, examination of patient, obtaining history from patient or surrogate, ordering and performing treatments and interventions, ordering and review of laboratory studies, ordering and review of radiographic studies, pulse oximetry and re-evaluation of patient's condition.  Labs Review Labs Reviewed  CBC WITH DIFFERENTIAL - Abnormal; Notable for the following:    RBC 3.61 (*)    Hemoglobin 10.9 (*)    MCV 102.5 (*)    MCHC 29.5 (*)    Neutrophils Relative % 81 (*)    Lymphocytes Relative 9 (*)    All other components within normal limits  COMPREHENSIVE METABOLIC PANEL - Abnormal; Notable for the following:    Sodium 134 (*)    Chloride 95 (*)    CO2 33 (*)    Glucose, Bld 154 (*)    Albumin 3.3 (*)    GFR calc non Af Amer 84 (*)    All other components within normal limits  URINALYSIS, ROUTINE W REFLEX MICROSCOPIC - Abnormal; Notable for the following:    Color, Urine AMBER (*)    Glucose, UA 100 (*)    Bilirubin Urine SMALL (*)    All other components within normal limits  CULTURE, BLOOD (ROUTINE X 2)  CULTURE, BLOOD (ROUTINE X 2)  URINE CULTURE  BLOOD GAS, VENOUS  BLOOD GAS, ARTERIAL  I-STAT CG4 LACTIC ACID, ED  I-STAT TROPOININ, ED    Imaging Review Ct Head Wo  Contrast  03/09/2014   CLINICAL DATA:  Altered mental status.  EXAM: CT HEAD WITHOUT CONTRAST  TECHNIQUE: Contiguous axial images were obtained from the base of  the skull through the vertex without intravenous contrast.  COMPARISON:  Brain MRI and head CT scans 02/02/2014.  FINDINGS: Chronic microvascular ischemic change is again seen and unchanged in appearance. There is no evidence of acute intracranial abnormality including infarct, hemorrhage, mass lesion, mass effect, midline shift or abnormal extra-axial fluid collection. No hydrocephalus or pneumocephalus. The calvarium is intact.  IMPRESSION: No acute finding.  Stable compared to prior exams.   Electronically Signed   By: Inge Rise M.D.   On: 03/13/2014 14:32   Ct Angio Chest Pe W/cm &/or Wo Cm  03/04/2014   CLINICAL DATA:  The kidney a. Rule out pulmonary embolism. Right abdominal pain and possible sepsis.  EXAM: CT ANGIOGRAPHY CHEST  CT ABDOMEN AND PELVIS WITH CONTRAST  TECHNIQUE: Multidetector CT imaging of the chest was performed using the standard protocol during bolus administration of intravenous contrast. Multiplanar CT image reconstructions and MIPs were obtained to evaluate the vascular anatomy. Multidetector CT imaging of the abdomen and pelvis was performed using the standard protocol during bolus administration of intravenous contrast.  CONTRAST:  100 cc low osmolar iodinated contrast.  COMPARISON:  11/11/2013 abdominal CT.  06/05/2011 chest CT.  FINDINGS: CTA CHEST FINDINGS  THORACIC INLET/BODY WALL:  Asymmetric subcutaneous reticulation in the imaged right upper extremity presumably related to right mastectomy. No evidence of adenopathy.  Incidental laryngocele on the left, partially imaged.  MEDIASTINUM:  Cardiomegaly, especially of the atria. No pericardial effusion. Multi focal coronary atherosclerosis. No aortic dissection. No pulmonary embolism. No lymphadenopathy.  LUNG WINDOWS:  There is a bronchovascular opacity in the right  upper lobe which is subpleural. Bronchial crowding consistent with volume loss. The appearance is similar 2012 and likely from radiation therapy given proximity to a right mastectomy. Near complete occlusion of the bronchus intermedius. Mottled internal density favors obstructing mucous rather than mass. The right middle lobe bronchi are angulated and completely effaced, with lateral segment collapse predominantly. Interlobular septal thickening in multiple locations compatible with mild interstitial edema. Bronchial wall thickening likely related to the same. No definitive pneumonia. Trace right pleural effusion.  OSSEOUS:  Radiation changes to the upper right ribs. No acute osseous findings.  CT ABDOMEN and PELVIS FINDINGS  BODY WALL: Unremarkable.  LOWER CHEST: Unremarkable.  ABDOMEN/PELVIS:  Liver: Periportal edema which is presumably reactive. This likely obscures intrahepatic biliary ductal dilation which was present previously.  Biliary: Cholecystostomy tube terminates anterior and inferior to the right liver. Gallbladder is decompressed relative to previous CT, although the walls are not clearly defined. There is unexpected fluid around the liver and porta hepatis, with thin peritoneal enhancement, best seen in the coronal projection, inferior to the liver. No mature fluid collections suggestive of abscess. There is chronic extrahepatic biliary ductal enlargement.  Pancreas: Unremarkable.  Spleen: Mild heterogeneous enhancement of the upper liver which appears vascular morphology. Ill-defined margins favors remote insult.  Adrenals: Low-density mass expanding the right adrenal gland consistent with adenoma, 18 mm in diameter.  Kidneys and ureters: No hydronephrosis or stone.  Bladder: Unremarkable.  Reproductive: Hysterectomy.  Bowel: Negative for bowel obstruction. Prominent colonic stool and gas without obstruction or definitive constipation. No evidence of bowel inflammation. Appendix not identified. No  pericecal inflammation.  Retroperitoneum: No mass or adenopathy.  Peritoneum: No ascites or pneumoperitoneum.  Vascular: No acute abnormality.  OSSEOUS: No acute abnormalities. L4-5 and L5-S1 posterior fusion. Bone harvest site noted from the left pelvis.  Review of the MIP images confirms the above findings.  IMPRESSION: 1. Negative for  pulmonary embolism. 2. Non loculated fluid in the right upper quadrant. Correlate with percutaneous cholecystostomy tube output and consider tube injection to exclude catheter malpositioning or bile leak. 3. Near complete occlusion of the bronchus intermedius, likely from mucous plugging. Followup chest CT recommended after convalescence to exclude endoluminal mass. 4. Cardiomegaly and mild pulmonary edema. 5. Incidental findings noted above.   Electronically Signed   By: Jorje Guild M.D.   On: 03/19/2014 15:01   Ct Abdomen Pelvis W Contrast  03/17/2014   CLINICAL DATA:  The kidney a. Rule out pulmonary embolism. Right abdominal pain and possible sepsis.  EXAM: CT ANGIOGRAPHY CHEST  CT ABDOMEN AND PELVIS WITH CONTRAST  TECHNIQUE: Multidetector CT imaging of the chest was performed using the standard protocol during bolus administration of intravenous contrast. Multiplanar CT image reconstructions and MIPs were obtained to evaluate the vascular anatomy. Multidetector CT imaging of the abdomen and pelvis was performed using the standard protocol during bolus administration of intravenous contrast.  CONTRAST:  100 cc low osmolar iodinated contrast.  COMPARISON:  11/11/2013 abdominal CT.  06/05/2011 chest CT.  FINDINGS: CTA CHEST FINDINGS  THORACIC INLET/BODY WALL:  Asymmetric subcutaneous reticulation in the imaged right upper extremity presumably related to right mastectomy. No evidence of adenopathy.  Incidental laryngocele on the left, partially imaged.  MEDIASTINUM:  Cardiomegaly, especially of the atria. No pericardial effusion. Multi focal coronary atherosclerosis. No  aortic dissection. No pulmonary embolism. No lymphadenopathy.  LUNG WINDOWS:  There is a bronchovascular opacity in the right upper lobe which is subpleural. Bronchial crowding consistent with volume loss. The appearance is similar 2012 and likely from radiation therapy given proximity to a right mastectomy. Near complete occlusion of the bronchus intermedius. Mottled internal density favors obstructing mucous rather than mass. The right middle lobe bronchi are angulated and completely effaced, with lateral segment collapse predominantly. Interlobular septal thickening in multiple locations compatible with mild interstitial edema. Bronchial wall thickening likely related to the same. No definitive pneumonia. Trace right pleural effusion.  OSSEOUS:  Radiation changes to the upper right ribs. No acute osseous findings.  CT ABDOMEN and PELVIS FINDINGS  BODY WALL: Unremarkable.  LOWER CHEST: Unremarkable.  ABDOMEN/PELVIS:  Liver: Periportal edema which is presumably reactive. This likely obscures intrahepatic biliary ductal dilation which was present previously.  Biliary: Cholecystostomy tube terminates anterior and inferior to the right liver. Gallbladder is decompressed relative to previous CT, although the walls are not clearly defined. There is unexpected fluid around the liver and porta hepatis, with thin peritoneal enhancement, best seen in the coronal projection, inferior to the liver. No mature fluid collections suggestive of abscess. There is chronic extrahepatic biliary ductal enlargement.  Pancreas: Unremarkable.  Spleen: Mild heterogeneous enhancement of the upper liver which appears vascular morphology. Ill-defined margins favors remote insult.  Adrenals: Low-density mass expanding the right adrenal gland consistent with adenoma, 18 mm in diameter.  Kidneys and ureters: No hydronephrosis or stone.  Bladder: Unremarkable.  Reproductive: Hysterectomy.  Bowel: Negative for bowel obstruction. Prominent colonic  stool and gas without obstruction or definitive constipation. No evidence of bowel inflammation. Appendix not identified. No pericecal inflammation.  Retroperitoneum: No mass or adenopathy.  Peritoneum: No ascites or pneumoperitoneum.  Vascular: No acute abnormality.  OSSEOUS: No acute abnormalities. L4-5 and L5-S1 posterior fusion. Bone harvest site noted from the left pelvis.  Review of the MIP images confirms the above findings.  IMPRESSION: 1. Negative for pulmonary embolism. 2. Non loculated fluid in the right upper quadrant. Correlate with percutaneous  cholecystostomy tube output and consider tube injection to exclude catheter malpositioning or bile leak. 3. Near complete occlusion of the bronchus intermedius, likely from mucous plugging. Followup chest CT recommended after convalescence to exclude endoluminal mass. 4. Cardiomegaly and mild pulmonary edema. 5. Incidental findings noted above.   Electronically Signed   By: Jorje Guild M.D.   On: 03/09/2014 15:01   Dg Chest Port 1 View  03/05/2014   CLINICAL DATA:  Sepsis with tachycardia and to kidney a and mental status change  EXAM: PORTABLE CHEST - 1 VIEW  COMPARISON:  PA and lateral chest of February 02, 2014  FINDINGS: The patient is rotated on this study. The lungs are hyperinflated. There is no focal infiltrate. There is minimal prominence of the interstitial markings which is not new. The cardiopericardial silhouette is top-normal in size. The pulmonary vascularity is not engorged. The bony thorax is unremarkable.  IMPRESSION: COPD without evidence of pneumonia nor CHF. When the patient can tolerate the procedure, a PA and lateral chest x-ray would be useful.   Electronically Signed   By: David  Martinique   On: 03/02/2014 11:23     EKG Interpretation None      MDM   Final diagnoses:  Altered mental status, unspecified altered mental status type  Respiratory distress  Atrial fibrillation, unspecified  Tachypnea  Right upper quadrant pain    Patient presenting from home with tachycardia, tachypnea and an earlier complaint of abdominal pain. She appears in moderate distress. On arrival she is afebrile rectally, tachycardic and in atrial fibrillation, tachypnea. Coarse lung sounds. She has a drain from her gallbladder present. Patient needs criteria for a level II code sepsis. Labs pending. Will also obtain abdominal CT to evaluate the area around the drain. Case discussed with attending Dr. Colin Rhein who also evaluated patient and agrees with plan of care.  12:32 PM Results of labs showing no leukocytosis, normal lactate. Chest x-ray impression COPD without evidence of pneumonia nor CHF. Patient remains tachycardic, she was placed under 3 L of oxygen which she is on at home and has an O2 sat around 93%. Cannot rule out pulmonary embolism, especially given patient's recent hospital stay. Will obtain CT angiography or chest. Her grandson is also present in the room and states she had a recent stroke a few weeks back and presented in a similar way. CT head also pending. Grandson had contacted her surgeon, Dr. Kellie Moor office today and was unable to make an appointment, so they advised her to go to the emergency department.  12:56 PM Pt still w tachypnea and appears to be struggling with breathing. Pt placed on BiPap by Dr. Colin Rhein.  3:56 PM CT head normal. CT chest and abdomen results as stated above. Regarding mucous plug, I consulted pulmonology and spoke with Dr. Nelda Marseille who will evaluate patient. I also spoke with Gen. surgery who will also evaluate patient for possible surgery for the drain in her gallbladder. Patient admitted to the hospitalist to the step down unit, admission accepted by Dr. Eliseo Squires, [facility]. Cardizem started per Dr. Eliseo Squires for Afib and tachycardia.  Case discussed with attending Dr. Colin Rhein who also evaluated patient and agrees with plan of care.   Illene Labrador, PA-C 03/09/2014 1559

## 2014-02-24 NOTE — ED Notes (Addendum)
Abnormal labs given to Dr. Colin Rhein and PA Gwenlyn Perking

## 2014-02-24 NOTE — Consult Note (Signed)
Theresa Blackwell [birth year]  [id number].    Requesting MD: Dr. Littie Deeds Chief Complaint/Reason for Consult: perc chole drain check HPI: This is an 80 yo patient known to [place] for a perc chole drain with COPD and multiple medical problems.  She was scheduled by Dr. Lynann Bologna for surgery, but has had multiple admissions for COPD exacerbations etc.  She presents today and grandson states she complained of abdominal pain, but since being here has had no abdominal pain.  She has gone into respiratory distress.  She is known on Bi-pap.  She had a CT scan that showed fluid near her gb with a question of a leak.  ROS: unable to obtain due to MS and respiratory status  Family History  Problem Relation Age of Onset  . Colon cancer Brother   . Heart disease Father     Past Medical History  Diagnosis Date  . Anxiety   . Smoking   . Fibromyalgia   . Depression   . Hyperlipidemia   . Hypercalcemia   . Adrenal adenoma     Stable as of 05/2012 CT scan  . Chronic respiratory failure   . Coronary artery disease   . GERD (gastroesophageal reflux disease)   . Lymphedema syndrome, postmastectomy     RUE  . Breast CA 1970    rt breast  . CHF (congestive heart failure)   . COPD (chronic obstructive pulmonary disease)   . On home oxygen therapy     "3L; 24/7" (11/11/2013)  . DJD (degenerative joint disease)   . Arthritis     "joints" (11/11/2013)  . Chronic back pain     "shoulders to lower back" (11/11/2013)  . Frequent UTI     Past Surgical History  Procedure Laterality Date  . Abdominal hysterectomy    . Appendectomy    . Squamous cell carcinoma excision      Floor of mouth  . Colonoscopy w/ biopsies and polypectomy    . Nasal sinus surgery  1994  . Eus  04/03/2012    Procedure: UPPER ENDOSCOPIC ULTRASOUND (EUS) LINEAR;  Surgeon: Rachael Fee, MD;  Location: [facility];  Service: Endoscopy;  Laterality: N/A;  radial linear  . Mastectomy, radical Right 1970  . Breast biopsy Right ~ 1970  .  Refractive surgery Bilateral ~ 2012  . Back surgery    . Posterior lumbar fusion  ?1970's    Social History:  reports that she has been smoking Cigarettes.  She has a 64 pack-year smoking history. She has never used smokeless tobacco. She reports that she does not drink alcohol or use illicit drugs.  Allergies: No Known Allergies   (Not in a hospital admission)  Blood pressure 135/72, pulse 138, temperature 98.8 F (37.1 C), temperature source Axillary, resp. rate 14, height 5\' 6"  (1.676 m), weight 134 lb (60.782 kg), SpO2 97.00%. Physical Exam: General:ill-appearing white female who is laying in bed on Bi-Pap HEENT: head is normocephalic, atraumatic.  Eyes closed Ears and nose without any masses or lesions.  Mouth is pink, no teeth Heart:irregularly irregular, converted and brady down while in the room, now bradycardic  Normal s1,s2. No obvious murmurs, gallops, or rubs noted.  Palpable radial and pedal pulses bilaterally Lungs: diminished breath sounds, Bi-Pap in place. Abd: soft, NT, ND, +BS, no masses, hernias, or organomegaly, perc chole drain in place with clear bile present in bag MS: all 4 extremities are symmetrical with no cyanosis, clubbing, or edema. Edema of RUE due to lymph  node dissection Skin: warm and dry with no masses, lesions, or rashes Psych: obtunded    Results for orders placed during the hospital encounter of 03/02/2014 (from the past 48 hour(s))  CBC WITH DIFFERENTIAL     Status: Abnormal   Collection Time    03/05/2014 11:19 AM      Result Value Ref Range   WBC 8.6  4.0 - 10.5 K/uL   RBC 3.61 (*) 3.87 - 5.11 MIL/uL   Hemoglobin 10.9 (*) 12.0 - 15.0 g/dL   HCT 37.0  36.0 - 46.0 %   MCV 102.5 (*) 78.0 - 100.0 fL   MCH 30.2  26.0 - 34.0 pg   MCHC 29.5 (*) 30.0 - 36.0 g/dL   RDW 14.2  11.5 - 15.5 %   Platelets 153  150 - 400 K/uL   Neutrophils Relative % 81 (*) 43 - 77 %   Neutro Abs 7.0  1.7 - 7.7 K/uL   Lymphocytes Relative 9 (*) 12 - 46 %   Lymphs Abs  0.8  0.7 - 4.0 K/uL   Monocytes Relative 10  3 - 12 %   Monocytes Absolute 0.8  0.1 - 1.0 K/uL   Eosinophils Relative 0  0 - 5 %   Eosinophils Absolute 0.0  0.0 - 0.7 K/uL   Basophils Relative 0  0 - 1 %   Basophils Absolute 0.0  0.0 - 0.1 K/uL  COMPREHENSIVE METABOLIC PANEL     Status: Abnormal   Collection Time    03/20/2014 11:19 AM      Result Value Ref Range   Sodium 134 (*) 137 - 147 mEq/L   Potassium 5.2  3.7 - 5.3 mEq/L   Chloride 95 (*) 96 - 112 mEq/L   CO2 33 (*) 19 - 32 mEq/L   Glucose, Bld 154 (*) 70 - 99 mg/dL   BUN 15  6 - 23 mg/dL   Creatinine, Ser 0.59  0.50 - 1.10 mg/dL   Calcium 10.0  8.4 - 10.5 mg/dL   Total Protein 6.8  6.0 - 8.3 g/dL   Albumin 3.3 (*) 3.5 - 5.2 g/dL   AST 23  0 - 37 U/L   ALT 16  0 - 35 U/L   Alkaline Phosphatase 80  39 - 117 U/L   Total Bilirubin 0.4  0.3 - 1.2 mg/dL   GFR calc non Af Amer 84 (*) >90 mL/min   GFR calc Af Amer >90  >90 mL/min   Comment: (NOTE)     The eGFR has been calculated using the CKD EPI equation.     This calculation has not been validated in all clinical situations.     eGFR's persistently <90 mL/min signify possible Chronic Kidney     Disease.   Anion gap 6  5 - 15  I-STAT CG4 LACTIC ACID, ED     Status: None   Collection Time    03/18/2014 11:25 AM      Result Value Ref Range   Lactic Acid, Venous 0.85  0.5 - 2.2 mmol/L  URINALYSIS, ROUTINE W REFLEX MICROSCOPIC     Status: Abnormal   Collection Time    03/02/2014 12:18 PM      Result Value Ref Range   Color, Urine AMBER (*) YELLOW   Comment: BIOCHEMICALS MAY BE AFFECTED BY COLOR   APPearance CLEAR  CLEAR   Specific Gravity, Urine 1.017  1.005 - 1.030   pH 5.5  5.0 - 8.0   Glucose, UA  100 (*) NEGATIVE mg/dL   Hgb urine dipstick NEGATIVE  NEGATIVE   Bilirubin Urine SMALL (*) NEGATIVE   Ketones, ur NEGATIVE  NEGATIVE mg/dL   Protein, ur NEGATIVE  NEGATIVE mg/dL   Urobilinogen, UA 1.0  0.0 - 1.0 mg/dL   Nitrite NEGATIVE  NEGATIVE   Leukocytes, UA NEGATIVE   NEGATIVE   Comment: MICROSCOPIC NOT DONE ON URINES WITH NEGATIVE PROTEIN, BLOOD, LEUKOCYTES, NITRITE, OR GLUCOSE <1000 mg/dL.  Randolm Idol, ED     Status: None   Collection Time    03/07/2014  4:00 PM      Result Value Ref Range   Troponin i, poc 0.02  0.00 - 0.08 ng/mL   Comment 3            Comment: Due to the release kinetics of cTnI,     a negative result within the first hours     of the onset of symptoms does not rule out     myocardial infarction with certainty.     If myocardial infarction is still suspected,     repeat the test at appropriate intervals.   Ct Head Wo Contrast  03/07/2014   CLINICAL DATA:  Altered mental status.  EXAM: CT HEAD WITHOUT CONTRAST  TECHNIQUE: Contiguous axial images were obtained from the base of the skull through the vertex without intravenous contrast.  COMPARISON:  Brain MRI and head CT scans 02/02/2014.  FINDINGS: Chronic microvascular ischemic change is again seen and unchanged in appearance. There is no evidence of acute intracranial abnormality including infarct, hemorrhage, mass lesion, mass effect, midline shift or abnormal extra-axial fluid collection. No hydrocephalus or pneumocephalus. The calvarium is intact.  IMPRESSION: No acute finding.  Stable compared to prior exams.   Electronically Signed   By: Inge Rise M.D.   On: 03/13/2014 14:32   Ct Angio Chest Pe W/cm &/or Wo Cm  03/02/2014   CLINICAL DATA:  The kidney a. Rule out pulmonary embolism. Right abdominal pain and possible sepsis.  EXAM: CT ANGIOGRAPHY CHEST  CT ABDOMEN AND PELVIS WITH CONTRAST  TECHNIQUE: Multidetector CT imaging of the chest was performed using the standard protocol during bolus administration of intravenous contrast. Multiplanar CT image reconstructions and MIPs were obtained to evaluate the vascular anatomy. Multidetector CT imaging of the abdomen and pelvis was performed using the standard protocol during bolus administration of intravenous contrast.  CONTRAST:   100 cc low osmolar iodinated contrast.  COMPARISON:  11/11/2013 abdominal CT.  06/05/2011 chest CT.  FINDINGS: CTA CHEST FINDINGS  THORACIC INLET/BODY WALL:  Asymmetric subcutaneous reticulation in the imaged right upper extremity presumably related to right mastectomy. No evidence of adenopathy.  Incidental laryngocele on the left, partially imaged.  MEDIASTINUM:  Cardiomegaly, especially of the atria. No pericardial effusion. Multi focal coronary atherosclerosis. No aortic dissection. No pulmonary embolism. No lymphadenopathy.  LUNG WINDOWS:  There is a bronchovascular opacity in the right upper lobe which is subpleural. Bronchial crowding consistent with volume loss. The appearance is similar 2012 and likely from radiation therapy given proximity to a right mastectomy. Near complete occlusion of the bronchus intermedius. Mottled internal density favors obstructing mucous rather than mass. The right middle lobe bronchi are angulated and completely effaced, with lateral segment collapse predominantly. Interlobular septal thickening in multiple locations compatible with mild interstitial edema. Bronchial wall thickening likely related to the same. No definitive pneumonia. Trace right pleural effusion.  OSSEOUS:  Radiation changes to the upper right ribs. No acute osseous findings.  CT  ABDOMEN and PELVIS FINDINGS  BODY WALL: Unremarkable.  LOWER CHEST: Unremarkable.  ABDOMEN/PELVIS:  Liver: Periportal edema which is presumably reactive. This likely obscures intrahepatic biliary ductal dilation which was present previously.  Biliary: Cholecystostomy tube terminates anterior and inferior to the right liver. Gallbladder is decompressed relative to previous CT, although the walls are not clearly defined. There is unexpected fluid around the liver and porta hepatis, with thin peritoneal enhancement, best seen in the coronal projection, inferior to the liver. No mature fluid collections suggestive of abscess. There is  chronic extrahepatic biliary ductal enlargement.  Pancreas: Unremarkable.  Spleen: Mild heterogeneous enhancement of the upper liver which appears vascular morphology. Ill-defined margins favors remote insult.  Adrenals: Low-density mass expanding the right adrenal gland consistent with adenoma, 18 mm in diameter.  Kidneys and ureters: No hydronephrosis or stone.  Bladder: Unremarkable.  Reproductive: Hysterectomy.  Bowel: Negative for bowel obstruction. Prominent colonic stool and gas without obstruction or definitive constipation. No evidence of bowel inflammation. Appendix not identified. No pericecal inflammation.  Retroperitoneum: No mass or adenopathy.  Peritoneum: No ascites or pneumoperitoneum.  Vascular: No acute abnormality.  OSSEOUS: No acute abnormalities. L4-5 and L5-S1 posterior fusion. Bone harvest site noted from the left pelvis.  Review of the MIP images confirms the above findings.  IMPRESSION: 1. Negative for pulmonary embolism. 2. Non loculated fluid in the right upper quadrant. Correlate with percutaneous cholecystostomy tube output and consider tube injection to exclude catheter malpositioning or bile leak. 3. Near complete occlusion of the bronchus intermedius, likely from mucous plugging. Followup chest CT recommended after convalescence to exclude endoluminal mass. 4. Cardiomegaly and mild pulmonary edema. 5. Incidental findings noted above.   Electronically Signed   By: Jorje Guild M.D.   On: 02/23/2014 15:01   Ct Abdomen Pelvis W Contrast     CLINICAL DATA:  The kidney a. Rule out pulmonary embolism. Right abdominal pain and possible sepsis.  EXAM: CT ANGIOGRAPHY CHEST  CT ABDOMEN AND PELVIS WITH CONTRAST  TECHNIQUE: Multidetector CT imaging of the chest was performed using the standard protocol during bolus administration of intravenous contrast. Multiplanar CT image reconstructions and MIPs were obtained to evaluate the vascular anatomy. Multidetector CT imaging of the  abdomen and pelvis was performed using the standard protocol during bolus administration of intravenous contrast.  CONTRAST:  100 cc low osmolar iodinated contrast.  COMPARISON:  11/11/2013 abdominal CT.  06/05/2011 chest CT.  FINDINGS: CTA CHEST FINDINGS  THORACIC INLET/BODY WALL:  Asymmetric subcutaneous reticulation in the imaged right upper extremity presumably related to right mastectomy. No evidence of adenopathy.  Incidental laryngocele on the left, partially imaged.  MEDIASTINUM:  Cardiomegaly, especially of the atria. No pericardial effusion. Multi focal coronary atherosclerosis. No aortic dissection. No pulmonary embolism. No lymphadenopathy.  LUNG WINDOWS:  There is a bronchovascular opacity in the right upper lobe which is subpleural. Bronchial crowding consistent with volume loss. The appearance is similar 2012 and likely from radiation therapy given proximity to a right mastectomy. Near complete occlusion of the bronchus intermedius. Mottled internal density favors obstructing mucous rather than mass. The right middle lobe bronchi are angulated and completely effaced, with lateral segment collapse predominantly. Interlobular septal thickening in multiple locations compatible with mild interstitial edema. Bronchial wall thickening likely related to the same. No definitive pneumonia. Trace right pleural effusion.  OSSEOUS:  Radiation changes to the upper right ribs. No acute osseous findings.  CT ABDOMEN and PELVIS FINDINGS  BODY WALL: Unremarkable.  LOWER CHEST: Unremarkable.  ABDOMEN/PELVIS:  Liver: Periportal edema which is presumably reactive. This likely obscures intrahepatic biliary ductal dilation which was present previously.  Biliary: Cholecystostomy tube terminates anterior and inferior to the right liver. Gallbladder is decompressed relative to previous CT, although the walls are not clearly defined. There is unexpected fluid around the liver and porta hepatis, with thin peritoneal  enhancement, best seen in the coronal projection, inferior to the liver. No mature fluid collections suggestive of abscess. There is chronic extrahepatic biliary ductal enlargement.  Pancreas: Unremarkable.  Spleen: Mild heterogeneous enhancement of the upper liver which appears vascular morphology. Ill-defined margins favors remote insult.  Adrenals: Low-density mass expanding the right adrenal gland consistent with adenoma, 18 mm in diameter.  Kidneys and ureters: No hydronephrosis or stone.  Bladder: Unremarkable.  Reproductive: Hysterectomy.  Bowel: Negative for bowel obstruction. Prominent colonic stool and gas without obstruction or definitive constipation. No evidence of bowel inflammation. Appendix not identified. No pericecal inflammation.  Retroperitoneum: No mass or adenopathy.  Peritoneum: No ascites or pneumoperitoneum.  Vascular: No acute abnormality.  OSSEOUS: No acute abnormalities. L4-5 and L5-S1 posterior fusion. Bone harvest site noted from the left pelvis.  Review of the MIP images confirms the above findings.  IMPRESSION: 1. Negative for pulmonary embolism. 2. Non loculated fluid in the right upper quadrant. Correlate with percutaneous cholecystostomy tube output and consider tube injection to exclude catheter malpositioning or bile leak. 3. Near complete occlusion of the bronchus intermedius, likely from mucous plugging. Followup chest CT recommended after convalescence to exclude endoluminal mass. 4. Cardiomegaly and mild pulmonary edema. 5. Incidental findings noted above.   Electronically Signed   By: Jorje Guild M.D.   On: 03/15/2014 15:01   Dg Chest Port 1 View  03/23/2014   CLINICAL DATA:  Sepsis with tachycardia and to kidney a and mental status change  EXAM: PORTABLE CHEST - 1 VIEW  COMPARISON:  PA and lateral chest of February 02, 2014  FINDINGS: The patient is rotated on this study. The lungs are hyperinflated. There is no focal infiltrate. There is minimal prominence of the  interstitial markings which is not new. The cardiopericardial silhouette is top-normal in size. The pulmonary vascularity is not engorged. The bony thorax is unremarkable.  IMPRESSION: COPD without evidence of pneumonia nor CHF. When the patient can tolerate the procedure, a PA and lateral chest x-ray would be useful.   Electronically Signed   By: David  Martinique   On: 03/22/2014 11:23       Assessment/Plan 1. Respiratory failure 2. A fib with RVR, converted 3. Percutaneous cholecystostomy drain Patient Active Problem List   Diagnosis Date Noted  . Altered mental status 03/17/2014  . CVA (cerebral infarction) 02/02/2014  . Urinary tract infectious disease 02/02/2014  . Left pontine CVA 02/02/2014  . Acute on chronic respiratory failure with hypercapnia 01/15/2014  . Acute encephalopathy 01/15/2014  . Abnormal LFTs 01/15/2014  . COPD (chronic obstructive pulmonary disease) 01/15/2014  . Severe protein-calorie malnutrition 11/12/2013  . Chronic diastolic heart failure 43/15/4008  . Physical deconditioning 11/12/2013  . Gallbladder gangrene 11/11/2013  . Leukocytosis, unspecified 11/11/2013  . GERD (gastroesophageal reflux disease) 11/11/2013  . Depression with anxiety 11/11/2013  . HLD (hyperlipidemia) 11/11/2013  . Acute cholecystitis 11/11/2013  . Multifocal atrial tachycardia 03/06/2012  . HX: breast cancer 03/06/2012  . Anxiety 03/06/2012  . Tobacco abuse 03/06/2012  . Chronic respiratory failure 03/06/2012  . Fibromyalgia 03/06/2012  . Nonspecific (abnormal) findings on radiological and other examination of biliary tract 02/03/2012  .  Urosepsis 02/02/2012  . Acute on chronic renal failure 02/02/2012  . COPD (chronic obstructive pulmonary disease) with acute bronchitis 06/11/2011  . CHF (congestive heart failure) 06/11/2011  . Osteoarthritis 06/11/2011  . Headache 06/10/2011  . Shoulder pain 06/09/2011  . Hypokalemia 06/09/2011  . Shortness of breath 06/06/2011  .  Abdominal pain 06/06/2011  . Hot flashes not due to menopause 06/06/2011  . Cystitis 06/06/2011   Plan: 1. Will have IR check tube placement tomorrow.  If it is in good position there are no further recommendations except to leave it in place.  If it has pulled out, then it should be replaced.  We will follow along.  No acute surgical indications.  Darcey Cardy E 03/08/2014, 4:29 PM Pager: [phone]

## 2014-02-24 NOTE — ED Notes (Signed)
Pt. Placed back on a Non-rebreather, Pt. Having a difficult time breathing.  Abdominal tugging noted. Resp. 28   Placed pt. Back on [place] 4liters per Dr. Colin Rhein.  RT at the bedside.

## 2014-02-24 NOTE — ED Notes (Signed)
Changed pt.s non-rebreather to nasal cannula 3liters of oxygen pt. 's O2 sats are 95%,

## 2014-02-24 NOTE — Progress Notes (Signed)
ABG    Component Value Date/Time   PHART 7.105* 03/01/2014 2000   PCO2ART 103.0* 03/07/2014 2000   PO2ART 90.7 03/07/2014 2000   HCO3 30.9* 03/08/2014 2000   TCO2 34.1 02/23/2014 2000   ACIDBASEDEF 5.2* 02/02/2012 0408   O2SAT 95.6 02/23/2014 2000

## 2014-02-24 NOTE — Consult Note (Signed)
PULMONARY / CRITICAL CARE MEDICINE  Name: Theresa Blackwell MRN: [id number] DOB: 04-04-1931    ADMISSION DATE:  02/28/2014 CONSULTATION DATE:  03/19/2014  REFERRING MD :  EDP PRIMARY SERVICE:  TRH  CHIEF COMPLAINT:  Abdominal pain  BRIEF PATIENT DESCRIPTION: 80 yo active smoker with COPD (on 3L oxygen) and percutaneous gall bladder drain presented to [facility] 9/2 complaining of R sided abdominal pain and dyspnea. She was found to be tachycardic and in moderate respiratory distress requiring BiPAP. CT of chest revealed bronchus intermedius likely obstructed by mucus.  SIGNIFICANT EVENTS / STUDIES:  9/2 CT head >>> nad 9/2 CTA chest/abd/pelvis >>> No PE, non loculated fluid in RUQ, near complete occlusion of bronchus intermedius, mild pulmonary edema  LINES / TUBES:  CULTURES: 9/2 Blood >>> 9/2 Urine >>>   ANTIBIOTICS: Zosyn 9/2 >>> Vancomycin 9/2 >>>  HISTORY OF PRESENT ILLNESS:  80 year old female with PMH as below, which includes end stage COPD (on 3L at home, current smoker, unable to ambulate to bathroom without significant SOB), Cholecystitis (with resultant indwelling percutaneous gall bladder drain), CAD, Breast CA, and CHF. She had a hospitalization in 10/2013 for gallbladder gangrene during which a cholecystectomy tube was placed. Since that time she has had multiple admissions for respiratory failure and urinary tract infection . She presented again to [facility] ED 9/2 with AMS and complaining of R sided abdominal pain. In ED found to be in AF with RVR, moderate respiratory distress, and gall bladder drainage is scant and darker than usual per family. She was placed on BiPAP for ventilatory support. Multiple CT scans in ED reveal free fluid around site of perc drain, nearly full obstruction of bronchus intermedius, and mild pulmonary edema. PCCM asked to see for further eval.   PAST MEDICAL HISTORY :  Past Medical History  Diagnosis Date  . Anxiety   . Smoking   . Fibromyalgia   . Depression    . Hyperlipidemia   . Hypercalcemia   . Adrenal adenoma     Stable as of 05/2012 CT scan  . Chronic respiratory failure   . Coronary artery disease   . GERD (gastroesophageal reflux disease)   . Lymphedema syndrome, postmastectomy     RUE  . Breast CA 1970    rt breast  . CHF (congestive heart failure)   . COPD (chronic obstructive pulmonary disease)   . On home oxygen therapy     "3L; 24/7" (11/11/2013)  . DJD (degenerative joint disease)   . Arthritis     "joints" (11/11/2013)  . Chronic back pain     "shoulders to lower back" (11/11/2013)  . Frequent UTI    Past Surgical History  Procedure Laterality Date  . Abdominal hysterectomy    . Appendectomy    . Squamous cell carcinoma excision      Floor of mouth  . Colonoscopy w/ biopsies and polypectomy    . Nasal sinus surgery  1994  . Eus  04/03/2012    Procedure: UPPER ENDOSCOPIC ULTRASOUND (EUS) LINEAR;  Surgeon: Milus Banister, MD;  Location: [facility] ENDOSCOPY;  Service: Endoscopy;  Laterality: N/A;  radial linear  . Mastectomy, radical Right 1970  . Breast biopsy Right ~ 1970  . Refractive surgery Bilateral ~ 2012  . Back surgery    . Posterior lumbar fusion  ?1970's   Prior to Admission medications   Medication Sig Start Date End Date Taking? Authorizing Provider  albuterol (PROAIR HFA) 108 (90 BASE) MCG/ACT inhaler Inhale 2  puffs into the lungs every 4 (four) hours as needed for wheezing or shortness of breath.   Yes Historical Provider, MD  albuterol (PROVENTIL) (2.5 MG/3ML) 0.083% nebulizer solution Take 2.5 mg by nebulization every 6 (six) hours as needed for wheezing or shortness of breath.   Yes Historical Provider, MD  alum & mag hydroxide-simeth (MAALOX/MYLANTA) 200-200-20 MG/5ML suspension Take 30 mLs by mouth daily as needed for indigestion or heartburn.   Yes Historical Provider, MD  aspirin 325 MG tablet Take 1 tablet (325 mg total) by mouth daily. 02/05/14  Yes Maryann Mikhail, DO  busPIRone (BUSPAR) 10 MG tablet  Take 10 mg by mouth 3 (three) times daily.  01/16/14  Yes Historical Provider, MD  ergocalciferol (VITAMIN D2) 50000 UNITS capsule Take 50,000 Units by mouth every 14 (fourteen) days.    Yes Historical Provider, MD  fentaNYL (DURAGESIC - DOSED MCG/HR) 75 MCG/HR Place 75 mcg onto the skin every 3 (three) days.  10/26/13  Yes Historical Provider, MD  furosemide (LASIX) 40 MG tablet Take 40 mg by mouth 2 (two) times daily.   Yes Historical Provider, MD  HYDROcodone-acetaminophen (NORCO/VICODIN) 5-325 MG per tablet Take 1-2 tablets by mouth every 4 (four) hours as needed for moderate pain.   Yes Historical Provider, MD  LORazepam (ATIVAN) 0.5 MG tablet Take 0.25-0.5 mg by mouth 2 (two) times daily. Take 1/2 tablet (0.25 mg) every morning and 1 tablet (0.5 mg) every night at bedtime   Yes Historical Provider, MD  mirtazapine (REMERON) 7.5 MG tablet Take 7.5 mg by mouth at bedtime.   Yes Historical Provider, MD  morphine (MS CONTIN) 15 MG 12 hr tablet Take 15 mg by mouth every 12 (twelve) hours.  01/28/14  Yes Historical Provider, MD  Multiple Vitamin (MULTIVITAMIN WITH MINERALS) TABS Take 1 tablet by mouth daily. 03/09/12  Yes Wenda Low, MD  omeprazole (PRILOSEC) 20 MG capsule Take 20 mg by mouth daily.     Yes Historical Provider, MD  ondansetron (ZOFRAN) 4 MG tablet Take 4 mg by mouth every 6 (six) hours as needed for nausea or vomiting.   Yes Historical Provider, MD  polyethylene glycol (MIRALAX / GLYCOLAX) packet Take 17 g by mouth daily.   Yes Historical Provider, MD  potassium chloride SA (K-DUR,KLOR-CON) 20 MEQ tablet Take 20 mEq by mouth 2 (two) times daily.   Yes Historical Provider, MD  pregabalin (LYRICA) 75 MG capsule Take 1 capsule (75 mg total) by mouth 2 (two) times daily. 03/10/12  Yes Wenda Low, MD  prochlorperazine (COMPAZINE) 10 MG tablet Take 10 mg by mouth every 6 (six) hours as needed for nausea.  02/01/14  Yes Historical Provider, MD  sertraline (ZOLOFT) 50 MG tablet Take 1 tablet  (50 mg total) by mouth daily. 11/13/13  Yes Wenda Low, MD  thiamine (VITAMIN B-1) 100 MG tablet Take 100 mg by mouth daily.   Yes Historical Provider, MD  tiotropium (SPIRIVA) 18 MCG inhalation capsule Place 18 mcg into inhaler and inhale daily as needed (shortness of breath/wheezing).    Yes Historical Provider, MD  tiZANidine (ZANAFLEX) 2 MG tablet Take 1 tablet (2 mg total) by mouth at bedtime. 02/05/14  Yes Maryann Mikhail, DO  triamcinolone cream (KENALOG) 0.1 % Apply 1 application topically 2 (two) times daily as needed (swelling in right arm).    Yes Historical Provider, MD  verapamil (CALAN-SR) 120 MG CR tablet Take 1 tablet (120 mg total) by mouth daily. 11/13/13  Yes Wenda Low, MD   No  Known Allergies  FAMILY HISTORY:  Family History  Problem Relation Age of Onset  . Colon cancer Brother   . Heart disease Father    SOCIAL HISTORY:  reports that she has been smoking Cigarettes.  She has a 64 pack-year smoking history. She has never used smokeless tobacco. She reports that she does not drink alcohol or use illicit drugs.  REVIEW OF SYSTEMS: Unable to provide  INTERVAL HISTORY:   VITAL SIGNS: Temp:  [98.8 F (37.1 C)] 98.8 F (37.1 C) (09/02 1047) Pulse Rate:  [41-148] 138 (09/02 1430) Resp:  [14-30] 14 (09/02 1430) BP: (100-138)/(46-81) 135/72 mmHg (09/02 1430) SpO2:  [81 %-100 %] 97 % (09/02 1430) Weight:  [134 lb (60.782 kg)] 134 lb (60.782 kg) (09/02 1100)  PHYSICAL EXAMINATION: General:  Frail, chronically ill appearing female, obtunded on bipap Neuro:  Obtunded, no response, gen weakness HEENT:  Mm dry, bipap Cardiovascular:  s1s2 brady Lungs:  Poor resp effort, on bipap, sats 100%, diminished R>L, few exp wheeze  Abdomen:  Soft, round, R sided perc drain Musculoskeletal:  Cool, dry, slightly mottled BLE, no sig BLE edema, RUE edema with sleeve in place   LABS: CBC  Recent Labs Lab 03/02/2014 1119  WBC 8.6  HGB 10.9*  HCT 37.0  PLT 153   Coag's No  results found for this basename: APTT, INR,  in the last 168 hours  BMET  Recent Labs Lab 03/06/2014 1119  NA 134*  K 5.2  CL 95*  CO2 33*  BUN 15  CREATININE 0.59  GLUCOSE 154*   Electrolytes  Recent Labs Lab 03/01/2014 1119  CALCIUM 10.0   Sepsis Markers  Recent Labs Lab 02/28/2014 1125  LATICACIDVEN 0.85   ABG No results found for this basename: PHART, PCO2ART, PO2ART,  in the last 168 hours  Liver Enzymes  Recent Labs Lab 03/01/2014 1119  AST 23  ALT 16  ALKPHOS 80  BILITOT 0.4  ALBUMIN 3.3*   Cardiac Enzymes No results found for this basename: TROPONINI, PROBNP,  in the last 168 hours  Glucose No results found for this basename: GLUCAP,  in the last 168 hours  IMAGING:  Ct Head Wo Contrast  03/04/2014   CLINICAL DATA:  Altered mental status.  EXAM: CT HEAD WITHOUT CONTRAST  TECHNIQUE: Contiguous axial images were obtained from the base of the skull through the vertex without intravenous contrast.  COMPARISON:  Brain MRI and head CT scans 02/02/2014.  FINDINGS: Chronic microvascular ischemic change is again seen and unchanged in appearance. There is no evidence of acute intracranial abnormality including infarct, hemorrhage, mass lesion, mass effect, midline shift or abnormal extra-axial fluid collection. No hydrocephalus or pneumocephalus. The calvarium is intact.  IMPRESSION: No acute finding.  Stable compared to prior exams.   Electronically Signed   By: Inge Rise M.D.   On:  14:32   Ct Angio Chest Pe W/cm &/or Wo Cm  03/19/2014   CLINICAL DATA:  The kidney a. Rule out pulmonary embolism. Right abdominal pain and possible sepsis.  EXAM: CT ANGIOGRAPHY CHEST  CT ABDOMEN AND PELVIS WITH CONTRAST  TECHNIQUE: Multidetector CT imaging of the chest was performed using the standard protocol during bolus administration of intravenous contrast. Multiplanar CT image reconstructions and MIPs were obtained to evaluate the vascular anatomy. Multidetector CT  imaging of the abdomen and pelvis was performed using the standard protocol during bolus administration of intravenous contrast.  CONTRAST:  100 cc low osmolar iodinated contrast.  COMPARISON:  11/11/2013 abdominal  CT.  06/05/2011 chest CT.  FINDINGS: CTA CHEST FINDINGS  THORACIC INLET/BODY WALL:  Asymmetric subcutaneous reticulation in the imaged right upper extremity presumably related to right mastectomy. No evidence of adenopathy.  Incidental laryngocele on the left, partially imaged.  MEDIASTINUM:  Cardiomegaly, especially of the atria. No pericardial effusion. Multi focal coronary atherosclerosis. No aortic dissection. No pulmonary embolism. No lymphadenopathy.  LUNG WINDOWS:  There is a bronchovascular opacity in the right upper lobe which is subpleural. Bronchial crowding consistent with volume loss. The appearance is similar 2012 and likely from radiation therapy given proximity to a right mastectomy. Near complete occlusion of the bronchus intermedius. Mottled internal density favors obstructing mucous rather than mass. The right middle lobe bronchi are angulated and completely effaced, with lateral segment collapse predominantly. Interlobular septal thickening in multiple locations compatible with mild interstitial edema. Bronchial wall thickening likely related to the same. No definitive pneumonia. Trace right pleural effusion.  OSSEOUS:  Radiation changes to the upper right ribs. No acute osseous findings.  CT ABDOMEN and PELVIS FINDINGS  BODY WALL: Unremarkable.  LOWER CHEST: Unremarkable.  ABDOMEN/PELVIS:  Liver: Periportal edema which is presumably reactive. This likely obscures intrahepatic biliary ductal dilation which was present previously.  Biliary: Cholecystostomy tube terminates anterior and inferior to the right liver. Gallbladder is decompressed relative to previous CT, although the walls are not clearly defined. There is unexpected fluid around the liver and porta hepatis, with thin  peritoneal enhancement, best seen in the coronal projection, inferior to the liver. No mature fluid collections suggestive of abscess. There is chronic extrahepatic biliary ductal enlargement.  Pancreas: Unremarkable.  Spleen: Mild heterogeneous enhancement of the upper liver which appears vascular morphology. Ill-defined margins favors remote insult.  Adrenals: Low-density mass expanding the right adrenal gland consistent with adenoma, 18 mm in diameter.  Kidneys and ureters: No hydronephrosis or stone.  Bladder: Unremarkable.  Reproductive: Hysterectomy.  Bowel: Negative for bowel obstruction. Prominent colonic stool and gas without obstruction or definitive constipation. No evidence of bowel inflammation. Appendix not identified. No pericecal inflammation.  Retroperitoneum: No mass or adenopathy.  Peritoneum: No ascites or pneumoperitoneum.  Vascular: No acute abnormality.  OSSEOUS: No acute abnormalities. L4-5 and L5-S1 posterior fusion. Bone harvest site noted from the left pelvis.  Review of the MIP images confirms the above findings.  IMPRESSION: 1. Negative for pulmonary embolism. 2. Non loculated fluid in the right upper quadrant. Correlate with percutaneous cholecystostomy tube output and consider tube injection to exclude catheter malpositioning or bile leak. 3. Near complete occlusion of the bronchus intermedius, likely from mucous plugging. Followup chest CT recommended after convalescence to exclude endoluminal mass. 4. Cardiomegaly and mild pulmonary edema. 5. Incidental findings noted above.   Electronically Signed   By: Jorje Guild M.D.   On: 03/10/2014 15:01   Ct Abdomen Pelvis W Contrast  03/08/2014   CLINICAL DATA:  The kidney a. Rule out pulmonary embolism. Right abdominal pain and possible sepsis.  EXAM: CT ANGIOGRAPHY CHEST  CT ABDOMEN AND PELVIS WITH CONTRAST  TECHNIQUE: Multidetector CT imaging of the chest was performed using the standard protocol during bolus administration of  intravenous contrast. Multiplanar CT image reconstructions and MIPs were obtained to evaluate the vascular anatomy. Multidetector CT imaging of the abdomen and pelvis was performed using the standard protocol during bolus administration of intravenous contrast.  CONTRAST:  100 cc low osmolar iodinated contrast.  COMPARISON:  11/11/2013 abdominal CT.  06/05/2011 chest CT.  FINDINGS: CTA CHEST FINDINGS  THORACIC INLET/BODY WALL:  Asymmetric subcutaneous reticulation in the imaged right upper extremity presumably related to right mastectomy. No evidence of adenopathy.  Incidental laryngocele on the left, partially imaged.  MEDIASTINUM:  Cardiomegaly, especially of the atria. No pericardial effusion. Multi focal coronary atherosclerosis. No aortic dissection. No pulmonary embolism. No lymphadenopathy.  LUNG WINDOWS:  There is a bronchovascular opacity in the right upper lobe which is subpleural. Bronchial crowding consistent with volume loss. The appearance is similar 2012 and likely from radiation therapy given proximity to a right mastectomy. Near complete occlusion of the bronchus intermedius. Mottled internal density favors obstructing mucous rather than mass. The right middle lobe bronchi are angulated and completely effaced, with lateral segment collapse predominantly. Interlobular septal thickening in multiple locations compatible with mild interstitial edema. Bronchial wall thickening likely related to the same. No definitive pneumonia. Trace right pleural effusion.  OSSEOUS:  Radiation changes to the upper right ribs. No acute osseous findings.  CT ABDOMEN and PELVIS FINDINGS  BODY WALL: Unremarkable.  LOWER CHEST: Unremarkable.  ABDOMEN/PELVIS:  Liver: Periportal edema which is presumably reactive. This likely obscures intrahepatic biliary ductal dilation which was present previously.  Biliary: Cholecystostomy tube terminates anterior and inferior to the right liver. Gallbladder is decompressed relative to  previous CT, although the walls are not clearly defined. There is unexpected fluid around the liver and porta hepatis, with thin peritoneal enhancement, best seen in the coronal projection, inferior to the liver. No mature fluid collections suggestive of abscess. There is chronic extrahepatic biliary ductal enlargement.  Pancreas: Unremarkable.  Spleen: Mild heterogeneous enhancement of the upper liver which appears vascular morphology. Ill-defined margins favors remote insult.  Adrenals: Low-density mass expanding the right adrenal gland consistent with adenoma, 18 mm in diameter.  Kidneys and ureters: No hydronephrosis or stone.  Bladder: Unremarkable.  Reproductive: Hysterectomy.  Bowel: Negative for bowel obstruction. Prominent colonic stool and gas without obstruction or definitive constipation. No evidence of bowel inflammation. Appendix not identified. No pericecal inflammation.  Retroperitoneum: No mass or adenopathy.  Peritoneum: No ascites or pneumoperitoneum.  Vascular: No acute abnormality.  OSSEOUS: No acute abnormalities. L4-5 and L5-S1 posterior fusion. Bone harvest site noted from the left pelvis.  Review of the MIP images confirms the above findings.  IMPRESSION: 1. Negative for pulmonary embolism. 2. Non loculated fluid in the right upper quadrant. Correlate with percutaneous cholecystostomy tube output and consider tube injection to exclude catheter malpositioning or bile leak. 3. Near complete occlusion of the bronchus intermedius, likely from mucous plugging. Followup chest CT recommended after convalescence to exclude endoluminal mass. 4. Cardiomegaly and mild pulmonary edema. 5. Incidental findings noted above.   Electronically Signed   By: Jorje Guild M.D.   On: 03/06/2014 15:01   Dg Chest Port 1 View  03/11/2014   CLINICAL DATA:  Sepsis with tachycardia and to kidney a and mental status change  EXAM: PORTABLE CHEST - 1 VIEW  COMPARISON:  PA and lateral chest of February 02, 2014   FINDINGS: The patient is rotated on this study. The lungs are hyperinflated. There is no focal infiltrate. There is minimal prominence of the interstitial markings which is not new. The cardiopericardial silhouette is top-normal in size. The pulmonary vascularity is not engorged. The bony thorax is unremarkable.  IMPRESSION: COPD without evidence of pneumonia nor CHF. When the patient can tolerate the procedure, a PA and lateral chest x-ray would be useful.   Electronically Signed   By: David  Martinique   On: 02/25/2014 11:23    ASSESSMENT /  PLAN:  PULMONARY A: Acute on chronic hypercarbic / hypoxemic respiratory failure AECOPD Bronchus intermedius obstruction with secretions Pulmonary edema Tobacco use disorder Possible HCAP P:   Goal SpO2>92 BiPAP Do NOT intubate Albuterol / Mucomyst Chest vest PT Trend ABG / CXR Solu-Medrol Would defer bronchoscopy due to tenuous respiratory status and DNI  CARDIOVASCULAR A:  AF RVR Acute on chronic diastolic heart failure P:  Avoid volume overload  Trend troponin DNR  RENAL A: Hyponatremia  P:   Trend BMP  GASTROINTESTINAL A:   Cholecystitis s/p cholecystostomy tube GI Px is not required P:   Per surgery  For IR eval perc drain 9/3 NPO  HEMATOLOGIC Anemia VTE Px P:  Trend CBC Lovenox  INFECTIOUS A:   Possible HCAP P:   Cultures as above  Vancomycin / Zosyn 9/2 >>> Consider deescalating to Levaquin for AECOPD PCT  ENDOCRINE A:   Adrenal adenoma P:   No intervention required  NEUROLOGIC A:  Acute encephalopathy in setting of hypercarbia P:   Avoid sedatives / opioids  Discussed at length with patient's grandson at bedside and patient's son (HCPOA) via phone.  Patient has had declining health over the last several months and has been clear in the past that she would not want long term or aggressive treatment including vent.  They are agreeable to continue abx, steroids, fluids, BiPAP.  OK for admit to SDU by  [place].  PCCM will follow.  Nickolas Madrid, NP 03/14/2014  4:47 PM Pager: [phone] or [phone]  I have personally obtained history, examined patient, evaluated and interpreted laboratory and imaging results, reviewed medical records, formulated assessment / plan and placed orders.  CRITICAL CARE:  The patient is critically ill with multiple organ systems failure and requires high complexity decision making for assessment and support, frequent evaluation and titration of therapies, application of advanced monitoring technologies and extensive interpretation of multiple databases. Critical Care Time devoted to patient care services described in this note is 45 minutes.   Doree Fudge, MD Pulmonary and [place] Pager: [phone]  03/24/2014, 5:58 PM

## 2014-02-24 NOTE — ED Notes (Signed)
Updated pt.s grandson on pt.s plan of care.  Pt.s grandson is helping to give pt. The po contrast.

## 2014-02-24 NOTE — ED Notes (Signed)
Pt. Began having rt.abdominal pain this am.  Pt. Has a drain in her Gallbladder and unknown when the drain is going to be removed.  Pt. Is confused to time.  Alert to self and place.   Hr. 107-170, resp 30s  Pt./ arrived on 15 liters of Oxygen Skin is pale and warm

## 2014-02-24 NOTE — ED Notes (Signed)
Patient transported to CT by respiratory and RN.

## 2014-02-24 NOTE — Progress Notes (Signed)
Pt seemed to be slightly confused during ventilator check, and ABG. Will try CPT after RR decrease.

## 2014-02-24 NOTE — Progress Notes (Signed)
ANTIBIOTIC CONSULT NOTE - INITIAL  Pharmacy Consult for Vanco/Zosyn Indication: rule out sepsis  No Known Allergies  Patient Measurements:   Adjusted Body Weight:    Vital Signs: Temp: 98.8 F (37.1 C) (09/02 1047) Temp src: Axillary (09/02 1047) BP: 138/81 mmHg (09/02 1047) Pulse Rate: 145 (09/02 1047) Intake/Output from previous day:   Intake/Output from this shift:    Labs: No results found for this basename: WBC, HGB, PLT, LABCREA, CREATININE,  in the last 72 hours The CrCl is unknown because both a height and weight (above a minimum accepted value) are required for this calculation. No results found for this basename: VANCOTROUGH, Corlis Leak, VANCORANDOM, [place], GENTPEAK, GENTRANDOM, TOBRATROUGH, TOBRAPEAK, TOBRARND, AMIKACINPEAK, AMIKACINTROU, AMIKACIN,  in the last 72 hours   Microbiology: Recent Results (from the past 720 hour(s))  URINE CULTURE     Status: None   Collection Time    02/02/14 11:36 AM      Result Value Ref Range Status   Specimen Description URINE, RANDOM   Final   Special Requests NONE   Final   Culture  Setup Time     Final   Value: 02/02/2014 17:15     Performed at [facility]     Final   Value: >=100,000 COLONIES/ML     Performed at [facility]   Culture     Final   Value: ESCHERICHIA COLI     Performed at [facility]   Report Status 02/05/2014 FINAL   Final   Organism ID, Bacteria ESCHERICHIA COLI   Final  CULTURE, BLOOD (ROUTINE X 2)     Status: None   Collection Time    02/02/14  2:10 PM      Result Value Ref Range Status   Specimen Description BLOOD LEFT ANTECUBITAL   Final   Special Requests BOTTLES DRAWN AEROBIC AND ANAEROBIC 10MLS   Final   Culture  Setup Time     Final   Value: 02/02/2014 18:51     Performed at [facility]   Culture     Final   Value: ESCHERICHIA COLI     Note: SUSCEPTIBILITIES PERFORMED ON PREVIOUS CULTURE WITHIN THE LAST 5 DAYS.     Note: Gram Stain  Report Called to,Read Back By and Verified With: JULIA HURRELBRINK@0948  ON [id number] BY [facility]     Performed at [facility]   Report Status 02/05/2014 FINAL   Final  CULTURE, BLOOD (ROUTINE X 2)     Status: None   Collection Time    02/02/14  2:25 PM      Result Value Ref Range Status   Specimen Description BLOOD LEFT ARM   Final   Special Requests BOTTLES DRAWN AEROBIC AND ANAEROBIC 10MLS   Final   Culture  Setup Time     Final   Value: 02/02/2014 18:49     Performed at [facility]   Culture     Final   Value: ESCHERICHIA COLI     Note: Gram Stain Report Called to,Read Back By and Verified With: JULIA HURRELBRINK@0948  ON [id number] BY [facility]     Performed at [facility]   Report Status 02/05/2014 FINAL   Final   Organism ID, Bacteria ESCHERICHIA COLI   Final  CULTURE, BLOOD (ROUTINE X 2)     Status: None   Collection Time    02/03/14  7:02 PM      Result Value Ref Range Status  Specimen Description BLOOD LEFT ANTECUBITAL   Final   Special Requests BOTTLES DRAWN AEROBIC AND ANAEROBIC 5CC   Final   Culture  Setup Time     Final   Value: 02/03/2014 22:25     Performed at [facility]   Culture     Final   Value: NO GROWTH 5 DAYS     Performed at [facility]   Report Status 02/09/2014 FINAL   Final  CULTURE, BLOOD (ROUTINE X 2)     Status: None   Collection Time    02/03/14  7:14 PM      Result Value Ref Range Status   Specimen Description BLOOD LEFT ARM   Final   Special Requests BOTTLES DRAWN AEROBIC AND ANAEROBIC 5CC   Final   Culture  Setup Time     Final   Value: 02/03/2014 22:25     Performed at [facility]   Culture     Final   Value: STAPHYLOCOCCUS SPECIES (COAGULASE NEGATIVE)     Note: THE SIGNIFICANCE OF ISOLATING THIS ORGANISM FROM A SINGLE SET OF BLOOD CULTURES WHEN MULTIPLE SETS ARE DRAWN IS UNCERTAIN. PLEASE NOTIFY THE MICROBIOLOGY DEPARTMENT WITHIN ONE WEEK IF SPECIATION AND SENSITIVITIES ARE REQUIRED.     Note: Gram  Stain Report Called to,Read Back By and Verified With: JEFF SIMPSON ON 02/05/2014 AT 12:38A BY Dennard Nip     Performed at [facility]   Report Status 02/06/2014 FINAL   Final    Medical History: Past Medical History  Diagnosis Date  . Anxiety   . Smoking   . Fibromyalgia   . Depression   . Hyperlipidemia   . Hypercalcemia   . Adrenal adenoma     Stable as of 05/2012 CT scan  . Chronic respiratory failure   . Coronary artery disease   . GERD (gastroesophageal reflux disease)   . Lymphedema syndrome, postmastectomy     RUE  . Breast CA 1970    rt breast  . CHF (congestive heart failure)   . COPD (chronic obstructive pulmonary disease)   . On home oxygen therapy     "3L; 24/7" (11/11/2013)  . DJD (degenerative joint disease)   . Arthritis     "joints" (11/11/2013)  . Chronic back pain     "shoulders to lower back" (11/11/2013)  . Frequent UTI     Medications: See med rec  Assessment: 80 y/o F with recent discharge 8/14 presented to ED with abdominal pain. She has a gallbladder drain in place from last discharge until surgery could be scheduled for laparoscopic cholecystectomy with cholangiogram. Previous admission revealed Ecoli in [facility] and [facility]. Afebrile. RR 30. HR 145, BP 138/81. Start abx for sepsis. Previous Scr 0.4   Goal of Therapy:  Vancomycin trough level 15-20 mcg/ml  Plan:  Zosyn 3.375g IV q8hr. Vancomycin 1g IV x 1 in ED then start Vancomycin 500mg  IV q12hr. Vancomycin trough after 3-5 doses at steady state. F/u baseline labs.   Timoteo Carreiro S. Alford Highland, PharmD, BCPS Clinical Staff Pharmacist Pager [phone]  Eilene Ghazi Stillinger 02/23/2014,10:59 AM

## 2014-02-24 NOTE — H&P (Addendum)
[facility] History and Physical  Theresa Blackwell ZSW:[id number] DOB: September 11, 1930 DOA: 02/25/2014  Referring physician: er PCP: Wenda Low, MD   Chief Complaint: SOB  HPI: Theresa Blackwell is a 80 y.o. female with multiple medical issues.   Who was brought in with right abd pain.  Upon presentation, she did not recall abd pain.  She has a drain in her GB x 4 months.  She was also confused when she arrived When EMS arrived, she was tachycardic, tachypneic and appeared short of breath. Heart rate was fluctuating from 107-170 with respirations in the 30s. She arrived on 15 L of oxygen.   At baseline, she is demented and has 24 hour caregivers.  She walks with a cane but is unable to more than a few steps.  She wears 3L O2  At home but continues to smoke cigarettes.   In the ER, she was found to be in a fib with RVR- converted to sinus during my exam.  She had a CTA done that showed mucous plug but no PE.   She was placed on Bipap and began to pull the bipap so she was given ativan which has sedated her.  I ordered ABG and she was found to have a PCO2 >100.  Patient was initially a full code, seen by PCCM who spoke with family and they decided to make her DNR/DNI and consider withdrawing care based on her response to steroids/nebs/abx.  Family willing to consider a palliative care consult if needed. She was also seen in the ER by surgery.  IR will be consulted to inject drain to look for leaks, etc.   Review of Systems:  Unable to do secondary to AMS   Past Medical History  Diagnosis Date  . Anxiety   . Smoking   . Fibromyalgia   . Depression   . Hyperlipidemia   . Hypercalcemia   . Adrenal adenoma     Stable as of 05/2012 CT scan  . Chronic respiratory failure   . Coronary artery disease   . GERD (gastroesophageal reflux disease)   . Lymphedema syndrome, postmastectomy     RUE  . Breast CA 1970    rt breast  . CHF (congestive heart failure)   . COPD (chronic obstructive  pulmonary disease)   . On home oxygen therapy     "3L; 24/7" (11/11/2013)  . DJD (degenerative joint disease)   . Arthritis     "joints" (11/11/2013)  . Chronic back pain     "shoulders to lower back" (11/11/2013)  . Frequent UTI    Past Surgical History  Procedure Laterality Date  . Abdominal hysterectomy    . Appendectomy    . Squamous cell carcinoma excision      Floor of mouth  . Colonoscopy w/ biopsies and polypectomy    . Nasal sinus surgery  1994  . Eus  04/03/2012    Procedure: UPPER ENDOSCOPIC ULTRASOUND (EUS) LINEAR;  Surgeon: Milus Banister, MD;  Location: [facility];  Service: Endoscopy;  Laterality: N/A;  radial linear  . Mastectomy, radical Right 1970  . Breast biopsy Right ~ 1970  . Refractive surgery Bilateral ~ 2012  . Back surgery    . Posterior lumbar fusion  ?1970's   Social History:  reports that she has been smoking Cigarettes.  She has a 64 pack-year smoking history. She has never used smokeless tobacco. She reports that she does not drink alcohol or use illicit drugs.  No Known Allergies  Family History  Problem Relation Age of Onset  . Colon cancer Brother   . Heart disease Father      Prior to Admission medications   Medication Sig Start Date End Date Taking? Authorizing Provider  albuterol (PROAIR HFA) 108 (90 BASE) MCG/ACT inhaler Inhale 2 puffs into the lungs every 4 (four) hours as needed for wheezing or shortness of breath.   Yes Historical Provider, MD  albuterol (PROVENTIL) (2.5 MG/3ML) 0.083% nebulizer solution Take 2.5 mg by nebulization every 6 (six) hours as needed for wheezing or shortness of breath.   Yes Historical Provider, MD  alum & mag hydroxide-simeth (MAALOX/MYLANTA) 200-200-20 MG/5ML suspension Take 30 mLs by mouth daily as needed for indigestion or heartburn.   Yes Historical Provider, MD  aspirin 325 MG tablet Take 1 tablet (325 mg total) by mouth daily. 02/05/14  Yes Maryann Mikhail, DO  busPIRone (BUSPAR) 10 MG tablet Take 10  mg by mouth 3 (three) times daily.  01/16/14  Yes Historical Provider, MD  ergocalciferol (VITAMIN D2) 50000 UNITS capsule Take 50,000 Units by mouth every 14 (fourteen) days.    Yes Historical Provider, MD  fentaNYL (DURAGESIC - DOSED MCG/HR) 75 MCG/HR Place 75 mcg onto the skin every 3 (three) days.  10/26/13  Yes Historical Provider, MD  furosemide (LASIX) 40 MG tablet Take 40 mg by mouth 2 (two) times daily.   Yes Historical Provider, MD  HYDROcodone-acetaminophen (NORCO/VICODIN) 5-325 MG per tablet Take 1-2 tablets by mouth every 4 (four) hours as needed for moderate pain.   Yes Historical Provider, MD  LORazepam (ATIVAN) 0.5 MG tablet Take 0.25-0.5 mg by mouth 2 (two) times daily. Take 1/2 tablet (0.25 mg) every morning and 1 tablet (0.5 mg) every night at bedtime   Yes Historical Provider, MD  mirtazapine (REMERON) 7.5 MG tablet Take 7.5 mg by mouth at bedtime.   Yes Historical Provider, MD  morphine (MS CONTIN) 15 MG 12 hr tablet Take 15 mg by mouth every 12 (twelve) hours.  01/28/14  Yes Historical Provider, MD  Multiple Vitamin (MULTIVITAMIN WITH MINERALS) TABS Take 1 tablet by mouth daily. 03/09/12  Yes Wenda Low, MD  omeprazole (PRILOSEC) 20 MG capsule Take 20 mg by mouth daily.     Yes Historical Provider, MD  ondansetron (ZOFRAN) 4 MG tablet Take 4 mg by mouth every 6 (six) hours as needed for nausea or vomiting.   Yes Historical Provider, MD  polyethylene glycol (MIRALAX / GLYCOLAX) packet Take 17 g by mouth daily.   Yes Historical Provider, MD  potassium chloride SA (K-DUR,KLOR-CON) 20 MEQ tablet Take 20 mEq by mouth 2 (two) times daily.   Yes Historical Provider, MD  pregabalin (LYRICA) 75 MG capsule Take 1 capsule (75 mg total) by mouth 2 (two) times daily. 03/10/12  Yes Wenda Low, MD  prochlorperazine (COMPAZINE) 10 MG tablet Take 10 mg by mouth every 6 (six) hours as needed for nausea.  02/01/14  Yes Historical Provider, MD  sertraline (ZOLOFT) 50 MG tablet Take 1 tablet (50 mg  total) by mouth daily. 11/13/13  Yes Wenda Low, MD  thiamine (VITAMIN B-1) 100 MG tablet Take 100 mg by mouth daily.   Yes Historical Provider, MD  tiotropium (SPIRIVA) 18 MCG inhalation capsule Place 18 mcg into inhaler and inhale daily as needed (shortness of breath/wheezing).    Yes Historical Provider, MD  tiZANidine (ZANAFLEX) 2 MG tablet Take 1 tablet (2 mg total) by mouth at bedtime. 02/05/14  Yes Maryann Mikhail, DO  triamcinolone  cream (KENALOG) 0.1 % Apply 1 application topically 2 (two) times daily as needed (swelling in right arm).    Yes Historical Provider, MD  verapamil (CALAN-SR) 120 MG CR tablet Take 1 tablet (120 mg total) by mouth daily. 11/13/13  Yes Wenda Low, MD   Physical Exam: Filed Vitals:   03/02/2014 1330 02/25/2014 1345 03/11/2014 1415 03/01/2014 1430  BP: 122/53 121/57 136/50 135/72  Pulse: 71 103 119 138  Temp:      TempSrc:      Resp:    14  Height:      Weight:      SpO2: 94% 88% 91% 97%    Wt Readings from Last 3 Encounters:  03/01/2014 60.782 kg (134 lb)  02/05/14 63 kg (138 lb 14.2 oz)  01/19/14 61.499 kg (135 lb 9.3 oz)    General:  Sedated on bipap Eyes: closed ENT: on bipap Neck: no JVD Cardiovascular: initially fast and irregular but converted to sinus brady Respiratory: decreased b/l, scattered rhonchi Abdomen: soft, ntnd Skin: no rash or induration seen on limited exam Musculoskeletal: grossly normal tone BUE/BLE Psychiatric: not responsive Neurologic: unable to follow commands          Labs on Admission:  Basic Metabolic Panel:  Recent Labs Lab 03/09/2014 1119  NA 134*  K 5.2  CL 95*  CO2 33*  GLUCOSE 154*  BUN 15  CREATININE 0.59  CALCIUM 10.0   Liver Function Tests:  Recent Labs Lab 03/07/2014 1119  AST 23  ALT 16  ALKPHOS 80  BILITOT 0.4  PROT 6.8  ALBUMIN 3.3*   No results found for this basename: LIPASE, AMYLASE,  in the last 168 hours No results found for this basename: AMMONIA,  in the last 168  hours CBC:  Recent Labs Lab 03/16/2014 1119  WBC 8.6  NEUTROABS 7.0  HGB 10.9*  HCT 37.0  MCV 102.5*  PLT 153   Cardiac Enzymes: No results found for this basename: CKTOTAL, CKMB, CKMBINDEX, TROPONINI,  in the last 168 hours  BNP (last 3 results) No results found for this basename: PROBNP,  in the last 8760 hours CBG: No results found for this basename: GLUCAP,  in the last 168 hours  Radiological Exams on Admission: Ct Head Wo Contrast  03/06/2014   CLINICAL DATA:  Altered mental status.  EXAM: CT HEAD WITHOUT CONTRAST  TECHNIQUE: Contiguous axial images were obtained from the base of the skull through the vertex without intravenous contrast.  COMPARISON:  Brain MRI and head CT scans 02/02/2014.  FINDINGS: Chronic microvascular ischemic change is again seen and unchanged in appearance. There is no evidence of acute intracranial abnormality including infarct, hemorrhage, mass lesion, mass effect, midline shift or abnormal extra-axial fluid collection. No hydrocephalus or pneumocephalus. The calvarium is intact.  IMPRESSION: No acute finding.  Stable compared to prior exams.   Electronically Signed   By: Inge Rise M.D.   On: 03/10/2014 14:32   Ct Angio Chest Pe W/cm &/or Wo Cm  02/28/2014   CLINICAL DATA:  The kidney a. Rule out pulmonary embolism. Right abdominal pain and possible sepsis.  EXAM: CT ANGIOGRAPHY CHEST  CT ABDOMEN AND PELVIS WITH CONTRAST  TECHNIQUE: Multidetector CT imaging of the chest was performed using the standard protocol during bolus administration of intravenous contrast. Multiplanar CT image reconstructions and MIPs were obtained to evaluate the vascular anatomy. Multidetector CT imaging of the abdomen and pelvis was performed using the standard protocol during bolus administration of intravenous contrast.  CONTRAST:  100 cc low osmolar iodinated contrast.  COMPARISON:  11/11/2013 abdominal CT.  06/05/2011 chest CT.  FINDINGS: CTA CHEST FINDINGS  THORACIC  INLET/BODY WALL:  Asymmetric subcutaneous reticulation in the imaged right upper extremity presumably related to right mastectomy. No evidence of adenopathy.  Incidental laryngocele on the left, partially imaged.  MEDIASTINUM:  Cardiomegaly, especially of the atria. No pericardial effusion. Multi focal coronary atherosclerosis. No aortic dissection. No pulmonary embolism. No lymphadenopathy.  LUNG WINDOWS:  There is a bronchovascular opacity in the right upper lobe which is subpleural. Bronchial crowding consistent with volume loss. The appearance is similar 2012 and likely from radiation therapy given proximity to a right mastectomy. Near complete occlusion of the bronchus intermedius. Mottled internal density favors obstructing mucous rather than mass. The right middle lobe bronchi are angulated and completely effaced, with lateral segment collapse predominantly. Interlobular septal thickening in multiple locations compatible with mild interstitial edema. Bronchial wall thickening likely related to the same. No definitive pneumonia. Trace right pleural effusion.  OSSEOUS:  Radiation changes to the upper right ribs. No acute osseous findings.  CT ABDOMEN and PELVIS FINDINGS  BODY WALL: Unremarkable.  LOWER CHEST: Unremarkable.  ABDOMEN/PELVIS:  Liver: Periportal edema which is presumably reactive. This likely obscures intrahepatic biliary ductal dilation which was present previously.  Biliary: Cholecystostomy tube terminates anterior and inferior to the right liver. Gallbladder is decompressed relative to previous CT, although the walls are not clearly defined. There is unexpected fluid around the liver and porta hepatis, with thin peritoneal enhancement, best seen in the coronal projection, inferior to the liver. No mature fluid collections suggestive of abscess. There is chronic extrahepatic biliary ductal enlargement.  Pancreas: Unremarkable.  Spleen: Mild heterogeneous enhancement of the upper liver which  appears vascular morphology. Ill-defined margins favors remote insult.  Adrenals: Low-density mass expanding the right adrenal gland consistent with adenoma, 18 mm in diameter.  Kidneys and ureters: No hydronephrosis or stone.  Bladder: Unremarkable.  Reproductive: Hysterectomy.  Bowel: Negative for bowel obstruction. Prominent colonic stool and gas without obstruction or definitive constipation. No evidence of bowel inflammation. Appendix not identified. No pericecal inflammation.  Retroperitoneum: No mass or adenopathy.  Peritoneum: No ascites or pneumoperitoneum.  Vascular: No acute abnormality.  OSSEOUS: No acute abnormalities. L4-5 and L5-S1 posterior fusion. Bone harvest site noted from the left pelvis.  Review of the MIP images confirms the above findings.  IMPRESSION: 1. Negative for pulmonary embolism. 2. Non loculated fluid in the right upper quadrant. Correlate with percutaneous cholecystostomy tube output and consider tube injection to exclude catheter malpositioning or bile leak. 3. Near complete occlusion of the bronchus intermedius, likely from mucous plugging. Followup chest CT recommended after convalescence to exclude endoluminal mass. 4. Cardiomegaly and mild pulmonary edema. 5. Incidental findings noted above.   Electronically Signed   By: Jorje Guild M.D.   On: 03/22/2014 15:01   Ct Abdomen Pelvis W Contrast  03/14/2014   CLINICAL DATA:  The kidney a. Rule out pulmonary embolism. Right abdominal pain and possible sepsis.  EXAM: CT ANGIOGRAPHY CHEST  CT ABDOMEN AND PELVIS WITH CONTRAST  TECHNIQUE: Multidetector CT imaging of the chest was performed using the standard protocol during bolus administration of intravenous contrast. Multiplanar CT image reconstructions and MIPs were obtained to evaluate the vascular anatomy. Multidetector CT imaging of the abdomen and pelvis was performed using the standard protocol during bolus administration of intravenous contrast.  CONTRAST:  100 cc low  osmolar iodinated contrast.  COMPARISON:  11/11/2013 abdominal  CT.  06/05/2011 chest CT.  FINDINGS: CTA CHEST FINDINGS  THORACIC INLET/BODY WALL:  Asymmetric subcutaneous reticulation in the imaged right upper extremity presumably related to right mastectomy. No evidence of adenopathy.  Incidental laryngocele on the left, partially imaged.  MEDIASTINUM:  Cardiomegaly, especially of the atria. No pericardial effusion. Multi focal coronary atherosclerosis. No aortic dissection. No pulmonary embolism. No lymphadenopathy.  LUNG WINDOWS:  There is a bronchovascular opacity in the right upper lobe which is subpleural. Bronchial crowding consistent with volume loss. The appearance is similar 2012 and likely from radiation therapy given proximity to a right mastectomy. Near complete occlusion of the bronchus intermedius. Mottled internal density favors obstructing mucous rather than mass. The right middle lobe bronchi are angulated and completely effaced, with lateral segment collapse predominantly. Interlobular septal thickening in multiple locations compatible with mild interstitial edema. Bronchial wall thickening likely related to the same. No definitive pneumonia. Trace right pleural effusion.  OSSEOUS:  Radiation changes to the upper right ribs. No acute osseous findings.  CT ABDOMEN and PELVIS FINDINGS  BODY WALL: Unremarkable.  LOWER CHEST: Unremarkable.  ABDOMEN/PELVIS:  Liver: Periportal edema which is presumably reactive. This likely obscures intrahepatic biliary ductal dilation which was present previously.  Biliary: Cholecystostomy tube terminates anterior and inferior to the right liver. Gallbladder is decompressed relative to previous CT, although the walls are not clearly defined. There is unexpected fluid around the liver and porta hepatis, with thin peritoneal enhancement, best seen in the coronal projection, inferior to the liver. No mature fluid collections suggestive of abscess. There is chronic  extrahepatic biliary ductal enlargement.  Pancreas: Unremarkable.  Spleen: Mild heterogeneous enhancement of the upper liver which appears vascular morphology. Ill-defined margins favors remote insult.  Adrenals: Low-density mass expanding the right adrenal gland consistent with adenoma, 18 mm in diameter.  Kidneys and ureters: No hydronephrosis or stone.  Bladder: Unremarkable.  Reproductive: Hysterectomy.  Bowel: Negative for bowel obstruction. Prominent colonic stool and gas without obstruction or definitive constipation. No evidence of bowel inflammation. Appendix not identified. No pericecal inflammation.  Retroperitoneum: No mass or adenopathy.  Peritoneum: No ascites or pneumoperitoneum.  Vascular: No acute abnormality.  OSSEOUS: No acute abnormalities. L4-5 and L5-S1 posterior fusion. Bone harvest site noted from the left pelvis.  Review of the MIP images confirms the above findings.  IMPRESSION: 1. Negative for pulmonary embolism. 2. Non loculated fluid in the right upper quadrant. Correlate with percutaneous cholecystostomy tube output and consider tube injection to exclude catheter malpositioning or bile leak. 3. Near complete occlusion of the bronchus intermedius, likely from mucous plugging. Followup chest CT recommended after convalescence to exclude endoluminal mass. 4. Cardiomegaly and mild pulmonary edema. 5. Incidental findings noted above.   Electronically Signed   By: Jorje Guild M.D.   On: 03/03/2014 15:01   Dg Chest Port 1 View  03/21/2014   CLINICAL DATA:  Sepsis with tachycardia and to kidney a and mental status change  EXAM: PORTABLE CHEST - 1 VIEW  COMPARISON:  PA and lateral chest of February 02, 2014  FINDINGS: The patient is rotated on this study. The lungs are hyperinflated. There is no focal infiltrate. There is minimal prominence of the interstitial markings which is not new. The cardiopericardial silhouette is top-normal in size. The pulmonary vascularity is not engorged. The  bony thorax is unremarkable.  IMPRESSION: COPD without evidence of pneumonia nor CHF. When the patient can tolerate the procedure, a PA and lateral chest x-ray would be useful.   Electronically Signed  By: David  Martinique   On: 03/24/2014 11:23    EKG: Independently reviewed. Atrial fib with RVR  Assessment/Plan Active Problems:   Tobacco abuse   Fibromyalgia   Acute on chronic respiratory failure with hypercapnia   Altered mental status   COPD with acute exacerbation  Acute resp failure with hypercapnia due to COPD exacerbation due to continued tobacco abuse (wears 3L O2 at home) -Bipap -steroids -nebs -abx (recent hospitalization) -if not responsive to Bipap will need further goals of care and patient to be made comfortable -DNR/DNI, no pressors  Fibromyalgia/anxiety/depression -on multiple sedating medications- pain meds, anxiety meds- will need to be resumed if patient recovers  Perc GB drain -IR to inject -surgery consulted  Poor overall prognosis -discussed with grandson the risk of aspiration with using Bipap while sedated  PCCM in ER surgery  Code Status: DNR DVT Prophylaxis: Family Communication: grandson in room Disposition Plan:   Time spent: 1 min  Eulogio Bear [facility] Pager [phone]  **Disclaimer: This note may have been dictated with voice recognition software. Similar sounding words can inadvertently be transcribed and this note may contain transcription errors which may not have been corrected upon publication of note.**

## 2014-02-25 ENCOUNTER — Inpatient Hospital Stay (HOSPITAL_COMMUNITY): Payer: Medicare Other

## 2014-02-25 DIAGNOSIS — J811 Chronic pulmonary edema: Secondary | ICD-10-CM | POA: Diagnosis present

## 2014-02-25 DIAGNOSIS — E875 Hyperkalemia: Secondary | ICD-10-CM | POA: Diagnosis present

## 2014-02-25 DIAGNOSIS — Z515 Encounter for palliative care: Secondary | ICD-10-CM

## 2014-02-25 DIAGNOSIS — J810 Acute pulmonary edema: Secondary | ICD-10-CM

## 2014-02-25 DIAGNOSIS — I5042 Chronic combined systolic (congestive) and diastolic (congestive) heart failure: Secondary | ICD-10-CM | POA: Diagnosis present

## 2014-02-25 DIAGNOSIS — R0989 Other specified symptoms and signs involving the circulatory and respiratory systems: Secondary | ICD-10-CM

## 2014-02-25 DIAGNOSIS — R0609 Other forms of dyspnea: Secondary | ICD-10-CM

## 2014-02-25 DIAGNOSIS — I4891 Unspecified atrial fibrillation: Secondary | ICD-10-CM | POA: Diagnosis present

## 2014-02-25 DIAGNOSIS — G894 Chronic pain syndrome: Secondary | ICD-10-CM

## 2014-02-25 DIAGNOSIS — I509 Heart failure, unspecified: Secondary | ICD-10-CM

## 2014-02-25 DIAGNOSIS — T85698A Other mechanical complication of other specified internal prosthetic devices, implants and grafts, initial encounter: Secondary | ICD-10-CM

## 2014-02-25 DIAGNOSIS — R4182 Altered mental status, unspecified: Secondary | ICD-10-CM

## 2014-02-25 DIAGNOSIS — K810 Acute cholecystitis: Secondary | ICD-10-CM

## 2014-02-25 LAB — POCT I-STAT 3, ART BLOOD GAS (G3+)
ACID-BASE EXCESS: 7 mmol/L — AB (ref 0.0–2.0)
BICARBONATE: 34.9 meq/L — AB (ref 20.0–24.0)
O2 Saturation: 99 %
PH ART: 7.338 — AB (ref 7.350–7.450)
Patient temperature: 97
TCO2: 37 mmol/L (ref 0–100)
pCO2 arterial: 64.4 mmHg (ref 35.0–45.0)
pO2, Arterial: 153 mmHg — ABNORMAL HIGH (ref 80.0–100.0)

## 2014-02-25 LAB — URINE CULTURE
COLONY COUNT: NO GROWTH
CULTURE: NO GROWTH

## 2014-02-25 LAB — CBC
HCT: 38.2 % (ref 36.0–46.0)
HEMOGLOBIN: 11.2 g/dL — AB (ref 12.0–15.0)
MCH: 29.9 pg (ref 26.0–34.0)
MCHC: 29.3 g/dL — ABNORMAL LOW (ref 30.0–36.0)
MCV: 102.1 fL — AB (ref 78.0–100.0)
Platelets: 149 10*3/uL — ABNORMAL LOW (ref 150–400)
RBC: 3.74 MIL/uL — AB (ref 3.87–5.11)
RDW: 14 % (ref 11.5–15.5)
WBC: 9.2 10*3/uL (ref 4.0–10.5)

## 2014-02-25 LAB — BASIC METABOLIC PANEL
ANION GAP: 11 (ref 5–15)
ANION GAP: 8 (ref 5–15)
Anion gap: 10 (ref 5–15)
BUN: 18 mg/dL (ref 6–23)
BUN: 20 mg/dL (ref 6–23)
BUN: 27 mg/dL — ABNORMAL HIGH (ref 6–23)
CHLORIDE: 96 meq/L (ref 96–112)
CO2: 27 meq/L (ref 19–32)
CO2: 28 meq/L (ref 19–32)
CO2: 31 meq/L (ref 19–32)
Calcium: 9.9 mg/dL (ref 8.4–10.5)
Calcium: 10 mg/dL (ref 8.4–10.5)
Calcium: 9.8 mg/dL (ref 8.4–10.5)
Chloride: 98 mEq/L (ref 96–112)
Chloride: 99 mEq/L (ref 96–112)
Creatinine, Ser: 0.59 mg/dL (ref 0.50–1.10)
Creatinine, Ser: 0.61 mg/dL (ref 0.50–1.10)
Creatinine, Ser: 0.62 mg/dL (ref 0.50–1.10)
GFR calc Af Amer: >90 mL/min (ref >90)
GFR calc Af Amer: >90 mL/min (ref >90)
GFR calc Af Amer: >90 mL/min (ref >90)
GFR calc non Af Amer: 84 mL/min — ABNORMAL LOW (ref >90)
GFR calc non Af Amer: 83 mL/min — ABNORMAL LOW (ref >90)
GFR, EST NON AFRICAN AMERICAN: 82 mL/min — AB (ref >90)
GLUCOSE: 147 mg/dL — AB (ref 70–99)
GLUCOSE: 134 mg/dL — AB (ref 70–99)
GLUCOSE: 114 mg/dL — AB (ref 70–99)
POTASSIUM: 5.8 meq/L — AB (ref 3.7–5.3)
POTASSIUM: 5.8 meq/L — AB (ref 3.7–5.3)
POTASSIUM: 5.3 meq/L (ref 3.7–5.3)
SODIUM: 137 meq/L (ref 137–147)
SODIUM: 138 meq/L (ref 137–147)
Sodium: 133 mEq/L — ABNORMAL LOW (ref 137–147)

## 2014-02-25 LAB — TROPONIN I: Troponin I: <0.3 ng/mL (ref <0.30)

## 2014-02-25 LAB — PROCALCITONIN

## 2014-02-25 MED ORDER — ASPIRIN 325 MG PO TABS
325.0000 mg | ORAL_TABLET | Freq: Every day | ORAL | Status: DC
  Filled 2014-02-25 (×3): qty 1

## 2014-02-25 MED ORDER — LORAZEPAM 0.5 MG PO TABS
0.2500 mg | ORAL_TABLET | Freq: Every day | ORAL | Status: DC
  Administered 2014-02-25: 0.25 mg via ORAL
  Filled 2014-02-25: qty 1

## 2014-02-25 MED ORDER — METOPROLOL TARTRATE 1 MG/ML IV SOLN
5.0000 mg | Freq: Once | INTRAVENOUS | Status: AC
  Administered 2014-02-25: 5 mg via INTRAVENOUS
  Filled 2014-02-25: qty 5

## 2014-02-25 MED ORDER — MORPHINE SULFATE ER 15 MG PO TBCR
15.0000 mg | EXTENDED_RELEASE_TABLET | Freq: Two times a day (BID) | ORAL | Status: DC
Start: 2014-02-25 — End: 2014-02-25
  Administered 2014-02-25: 15 mg via ORAL
  Filled 2014-02-25: qty 1

## 2014-02-25 MED ORDER — FUROSEMIDE 10 MG/ML IJ SOLN
INTRAMUSCULAR | Status: AC
  Filled 2014-02-25: qty 8

## 2014-02-25 MED ORDER — VERAPAMIL HCL ER 120 MG PO TBCR: 120.0000 mg | EXTENDED_RELEASE_TABLET | Freq: Every day | ORAL | Status: DC

## 2014-02-25 MED ORDER — NICOTINE 21 MG/24HR TD PT24
21.0000 mg | MEDICATED_PATCH | Freq: Every day | TRANSDERMAL | Status: DC
  Filled 2014-02-25: qty 1

## 2014-02-25 MED ORDER — POLYETHYLENE GLYCOL 3350 17 G PO PACK: 17.0000 g | PACK | Freq: Every day | ORAL | Status: DC

## 2014-02-25 MED ORDER — MORPHINE SULFATE 2 MG/ML IJ SOLN: 2.0000 mg | INTRAMUSCULAR | Status: DC | PRN

## 2014-02-25 MED ORDER — FENTANYL 25 MCG/HR TD PT72: 75.0000 ug | MEDICATED_PATCH | TRANSDERMAL | Status: DC

## 2014-02-25 MED ORDER — PHENAZOPYRIDINE HCL 200 MG PO TABS
200.0000 mg | ORAL_TABLET | Freq: Once | ORAL | Status: DC
Start: 2014-02-25 — End: 2014-02-25
  Filled 2014-02-25: qty 1

## 2014-02-25 MED ORDER — LORAZEPAM 0.5 MG PO TABS: 0.5000 mg | ORAL_TABLET | Freq: Every day | ORAL | Status: DC

## 2014-02-25 MED ORDER — MORPHINE BOLUS VIA INFUSION
2.0000 mg | INTRAVENOUS | Status: DC | PRN
  Administered 2014-02-26 (×4): 2 mg via INTRAVENOUS
  Filled 2014-02-25: qty 2

## 2014-02-25 MED ORDER — LORAZEPAM 2 MG/ML IJ SOLN
1.0000 mg | Freq: Four times a day (QID) | INTRAMUSCULAR | Status: DC | PRN
  Administered 2014-02-25: 1 mg via INTRAVENOUS
  Filled 2014-02-25: qty 1

## 2014-02-25 MED ORDER — SODIUM CHLORIDE 0.9 % IV SOLN: INTRAVENOUS | Status: DC

## 2014-02-25 MED ORDER — FUROSEMIDE 10 MG/ML IJ SOLN
60.0000 mg | Freq: Once | INTRAMUSCULAR | Status: AC
  Administered 2014-02-25: 60 mg via INTRAVENOUS

## 2014-02-25 MED ORDER — PANTOPRAZOLE SODIUM 40 MG PO TBEC
40.0000 mg | DELAYED_RELEASE_TABLET | Freq: Every day | ORAL | Status: DC
  Administered 2014-02-25: 40 mg via ORAL
  Filled 2014-02-25: qty 1

## 2014-02-25 MED ORDER — PHENAZOPYRIDINE HCL 200 MG PO TABS
200.0000 mg | ORAL_TABLET | Freq: Once | ORAL | Status: DC
  Filled 2014-02-25: qty 1

## 2014-02-25 MED ORDER — VERAPAMIL HCL ER 120 MG PO TBCR
120.0000 mg | EXTENDED_RELEASE_TABLET | Freq: Every day | ORAL | Status: DC
  Administered 2014-02-25: 120 mg via ORAL
  Filled 2014-02-25: qty 1

## 2014-02-25 MED ORDER — NICOTINE 21 MG/24HR TD PT24
21.0000 mg | MEDICATED_PATCH | Freq: Every day | TRANSDERMAL | Status: DC
  Filled 2014-02-25 (×2): qty 1

## 2014-02-25 MED ORDER — BUSPIRONE HCL 10 MG PO TABS
10.0000 mg | ORAL_TABLET | Freq: Three times a day (TID) | ORAL | Status: DC
  Filled 2014-02-25 (×3): qty 1

## 2014-02-25 MED ORDER — MIRTAZAPINE 7.5 MG PO TABS
7.5000 mg | ORAL_TABLET | Freq: Every day | ORAL | Status: DC
  Filled 2014-02-25: qty 1

## 2014-02-25 MED ORDER — MORPHINE SULFATE 10 MG/ML IJ SOLN
1.0000 mg/h | INTRAMUSCULAR | Status: DC
  Administered 2014-02-25: 1 mg/h via INTRAVENOUS
  Filled 2014-02-25: qty 10

## 2014-02-25 MED ORDER — DILTIAZEM HCL 100 MG IV SOLR
5.0000 mg/h | INTRAVENOUS | Status: DC
  Administered 2014-02-25: 5 mg/h via INTRAVENOUS

## 2014-02-25 MED ORDER — SERTRALINE HCL 50 MG PO TABS
50.0000 mg | ORAL_TABLET | Freq: Every day | ORAL | Status: DC
  Administered 2014-02-25: 50 mg via ORAL
  Filled 2014-02-25: qty 1

## 2014-02-25 MED ORDER — HALOPERIDOL LACTATE 5 MG/ML IJ SOLN: 1.0000 mg | INTRAMUSCULAR | Status: DC | PRN

## 2014-02-25 MED ORDER — LORAZEPAM 2 MG/ML IJ SOLN
1.0000 mg | INTRAMUSCULAR | Status: DC | PRN
Start: 2014-02-25 — End: 2014-02-27
  Filled 2014-02-25: qty 1

## 2014-02-25 MED ORDER — ENOXAPARIN SODIUM 40 MG/0.4ML ~~LOC~~ SOLN
40.0000 mg | SUBCUTANEOUS | Status: DC
  Filled 2014-02-25 (×2): qty 0.4

## 2014-02-25 MED ORDER — HALOPERIDOL LACTATE 5 MG/ML IJ SOLN
5.0000 mg | Freq: Once | INTRAMUSCULAR | Status: AC
  Administered 2014-02-25: 5 mg via INTRAVENOUS
  Filled 2014-02-25: qty 1

## 2014-02-25 NOTE — Consult Note (Signed)
Recommend study of drain once able.  Seems to be working well. Not operative candidate.

## 2014-02-25 NOTE — Progress Notes (Signed)
Erin Hearing notified of pt increased heart rate to 150s with history of a fib rvr on admission to ED.  Here at bedside to assess pt.  Orders received.  Will continue to monitor pt closely.

## 2014-02-25 NOTE — Progress Notes (Signed)
Await Study

## 2014-02-25 NOTE — Progress Notes (Signed)
[facility]  JANNETH KRASNER XHB:[id number] DOB: Feb 24, 1931 DOA: 03/06/2014 PCP: Wenda Low, MD   Brief narrative: 80 year old female patient with multiple medical problems. Presented to the emergency department because of shortness of breath. Apparently had also been complaining of abdominal pain prior to arrival to the emergency department. EMS was called to the home where they found the patient to be tachycardic, tachypneic and dyspneic. Her heart rate was fluctuating between 107 and 170 beats per minute with respirations in the 30s. She was placed on 100% nonrebreather. Her baseline status is dementia with 24-hour caregivers. She has limited mobility with a cane walking only a few steps at a time. She is on chronic nasal cannula oxygen at 3 L and continues to smoke cigarettes.  After arrival to the emergency department she was given nebulizer treatments without any improvement in her respiratory symptoms. She was noted to be very diminished in the right lung region She had no leukocytosis. Chest x-ray was consistent with COPD without evidence of pneumonia or CHF. She was in nature fibrillation with RVR. CT of the chest did not demonstrate PE. Because of ongoing dyspnea she required BiPAP. Subsequent CT of the chest was concerning for mucous plug in the bronchus intermedius. Pulmonary medicine was consulted who evaluated the patient. Because of her tenuous respiratory status and rest for intubation bronchoscopy was deferred and conservative medical therapy with pulmonary toileting, supportive care and IV steroids was initiated. General surgery was also consulted regarding the patient's current cholecystostomy tube. She was given Cardizem IV for her atrial fibrillation with RVR. By the time the admitting physician arrived to evaluate the patient her atrial fibrillation had resolved and she was maintaining sinus rhythm.  After admission patient remained lethargic on  the BiPAP including during initial morning rounds. By later in the morning patient became very alert, pulled off her BiPAP, was confused and very restless. Her usual home medications were resumed except for her Lyrica. It was clarified that her fentanyl patch had been discontinued at least one month prior. Followup chest x-ray at this time was concerning for bilateral pulmonary edema with evolving pleural effusions. ABG revealed mildly persistent but greatly improved respiratory acidosis. Because of her agitation and no wheezing on exam her steroids were discontinued.  HPI/Subjective: Initially lethargic but returned to bedside later and patient very confused and agitated. No complaints verbalized including when asked if she was in pain.  Assessment/Plan: Active Problems:   Acute on chronic respiratory failure with hypercapnia due to:   A) Mucous plug bronchus intermedia    B) Pulmonary edema   C) COPD with acute exacerbation   D) Chronic combined systolic and diastolic CHF, NYHA class 1 -Mucous plug appears to be precipitating event and has resolved based on clinical exam-no longer wheezing so COPD exacerbation appeared transient as well so have discontinued steroids-no definitive infiltrate on chest x-ray so consider discontinue antibiotics especially since no fever or leukocytosis at presentation-current exam more consistent with flash pulmonary edema as evidenced by JVD and changes on repeat x-ray therefore will give one-time dose of Lasix 60 mg and monitor response-has history of very mild systolic dysfunction and associated diastolic dysfunction and was normally on 40 of Lasix twice a day at home-cont pulmonary toileting-patient refusing BiPAP -Family has decided to transition to comfort care    3. Symptom Management per DO Doran Clay (palliative care) .: related to chronic pain, agitation/delirium, dyspnea and resp abnormality  - I  think '5mg'$  of haldol is likely too much for her. Would  be cautious with benzo's given her encephalopathy. I will place order for haldol 1-$RemoveBefo'2mg'qnphZLqcbVB$  q4h PRN. i am not concerned about her QTc of 460 with low dose haldol unlikely to cause arrythmia.  - can continue current MSS[facility] regimen (though may be unable to take at this point). I will add prn IV morphine dose  IF they elect comfort care tonight my recommendations would be as follows:  - d/c all oral meds, abx, nebs  -Start morphine continuous infusion at $RemoveBef'1mg'gHxddiErus$ /hr with $Remove'2mg'rGDjOth$  PRN bolus dose q72min PRN  - haldol 1-$RemoveBe'2mg'OWPXpamST$  PRN anxiety agitation  - Ativan $Remove'1mg'HhLVMME$  IV q2h prn severe dyspnea/agitation  - d/c NRB and transition to O2 via [place] at 6L and transition down to 2L for comfort (would not titrate to O2 sat)    Gallbladder gangrene s/p cholecystostomy tube -Surgery consulted-plan study of drain to see if appropriate to remove-not an operative candidate -Transitioned to comfort care, see symptom management    Atrial fibrillation with RVR -Recurred after became alert and agitated; also noted in setting of hypoxia and current pulmonary edema-will give one-time dose of Lopressor IV and resume home daily and-if rates remain elevated may require Cardizem infusion to titrate Transitioned to comfort care, see symptom management    Hyperkalemia -Potassium has been greater than 5.6 x2 collections-will repeat again after Lasix and remains elevated will need Kayexalate-was on Lasix with potassium supplementation prior to admission -Transitioned to comfort care, see symptom management    Tobacco abuse -Place nicotine patch for comfort -Transitioned to comfort care, see symptom management    Fibromyalgia/chronic pain syndrome -Transitioned to comfort care, see symptom management     DVT prophylaxis: Lovenox Code Status: DO NOT RESUSCITATE Family Communication: Grandson at [place] with niece-son is POA but she is amenable to Palliative eval and agrees prognosis poor (niece: Jana Hakim home [phone] and cell  [phone]) Disposition Plan/Expected LOS: Step down   Consultants: Surgery PCCM  DO Doran Clay [facility])  Procedures: None  Cultures: 9/2 blood cultures x2 pending 9/2 urine culture pending  Antibiotics: Zosyn 9/2 >>> stopped 9/3 Vancomycin 9/2 >>> stopped 9/3   Objective: Blood pressure 96/81, pulse 113, temperature 98.3 F (36.8 C), temperature source Oral, resp. rate 23, height $RemoveBe'5\' 6"'FOCbCFdfd$  (1.676 m), weight 144 lb 2.9 oz (65.4 kg), SpO2 90.00%.  Intake/Output Summary (Last 24 hours) at 02/25/14 1407 Last data filed at 02/25/14 1000  Gross per 24 hour  Intake 266.67 ml  Output     60 ml  Net 206.67 ml     Exam: Gen: Mild respiratory distress-agitated, anxious and confused Chest: Clear to auscultation bilaterally without wheezes, rhonchi or crackles, initially was on BiPAP but now on 4 L nasal cannula Cardiac: Irregular tachycardic rate and rhythm, S1-S2, no rubs murmurs or gallops, no peripheral edema, 5-6+ JVD Abdomen: Soft nontender nondistended without obvious hepatosplenomegaly, no ascites Extremities: Symmetrical in appearance without cyanosis, clubbing or effusion  Scheduled Meds:  Scheduled Meds: . acetylcysteine  4 mL Nebulization Q6H  . albuterol  2.5 mg Nebulization Q6H  . aspirin  325 mg Oral Daily  . busPIRone  10 mg Oral TID  . enoxaparin (LOVENOX) injection  40 mg Subcutaneous Q24H  . LORazepam  0.25 mg Oral Daily  . LORazepam  0.5 mg Oral QHS  . morphine  15 mg Oral Q12H  . pantoprazole  40 mg Oral Daily  . piperacillin-tazobactam (ZOSYN)  IV  3.375 g Intravenous  Q8H  . polyethylene glycol  17 g Oral Daily  . sertraline  50 mg Oral Daily  . vancomycin  500 mg Intravenous Q12H  . verapamil  120 mg Oral Daily   Continuous Infusions: . sodium chloride 50 mL/hr at 02/25/14 0845    Data Reviewed: Basic Metabolic Panel:  Recent Labs Lab 03/12/2014 1119 02/25/14 0116 02/25/14 0825  NA 134* 133* 137  K 5.2 5.8* 5.8*  CL 95* 96 98   CO2 33* 27 28  GLUCOSE 154* 147* 134*  BUN $Re'15 18 20  'jzC$ CREATININE 0.59 0.59 0.61  CALCIUM 10.0 9.9 10.0   Liver Function Tests:  Recent Labs Lab 03/12/2014 1119  AST 23  ALT 16  ALKPHOS 80  BILITOT 0.4  PROT 6.8  ALBUMIN 3.3*   No results found for this basename: LIPASE, AMYLASE,  in the last 168 hours No results found for this basename: AMMONIA,  in the last 168 hours CBC:  Recent Labs Lab 03/16/2014 1119 02/25/14 0116  WBC 8.6 9.2  NEUTROABS 7.0  --   HGB 10.9* 11.2*  HCT 37.0 38.2  MCV 102.5* 102.1*  PLT 153 149*   Cardiac Enzymes:  Recent Labs Lab 02/23/2014 1910 02/25/14 0117 02/25/14 0925  TROPONINI <0.30 <0.30 <0.30   BNP (last 3 results) No results found for this basename: PROBNP,  in the last 8760 hours CBG: No results found for this basename: GLUCAP,  in the last 168 hours  Recent Results (from the past 240 hour(s))  CULTURE, BLOOD (ROUTINE X 2)     Status: None   Collection Time    02/25/2014 11:03 AM      Result Value Ref Range Status   Specimen Description BLOOD LEFT FOREARM   Final   Special Requests BOTTLES DRAWN AEROBIC ONLY 5CC   Final   Culture  Setup Time     Final   Value: 03/10/2014 17:15     Performed at [facility]   Culture     Final   Value:        BLOOD CULTURE RECEIVED NO GROWTH TO DATE CULTURE WILL BE HELD FOR 5 DAYS BEFORE ISSUING A FINAL NEGATIVE REPORT     Performed at [facility]   Report Status PENDING   Incomplete  CULTURE, BLOOD (ROUTINE X 2)     Status: None   Collection Time    03/05/2014 11:17 AM      Result Value Ref Range Status   Specimen Description BLOOD LEFT HAND   Final   Special Requests BOTTLES DRAWN AEROBIC AND ANAEROBIC 5CC   Final   Culture  Setup Time     Final   Value: 03/08/2014 17:13     Performed at [facility]   Culture     Final   Value:        BLOOD CULTURE RECEIVED NO GROWTH TO DATE CULTURE WILL BE HELD FOR 5 DAYS BEFORE ISSUING A FINAL NEGATIVE REPORT     Performed at  [facility]   Report Status PENDING   Incomplete  URINE CULTURE     Status: None   Collection Time    03/07/2014 12:16 PM      Result Value Ref Range Status   Specimen Description URINE, CATHETERIZED   Final   Special Requests NONE   Final   Culture  Setup Time     Final   Value: 02/23/2014 17:31     Performed at [facility]  Colony Count     Final   Value: NO GROWTH     Performed at [facility]   Culture     Final   Value: NO GROWTH     Performed at [facility]   Report Status 02/25/2014 FINAL   Final  MRSA PCR SCREENING     Status: None   Collection Time    03/12/2014  7:36 PM      Result Value Ref Range Status   MRSA by PCR NEGATIVE  NEGATIVE Final   Comment:            The GeneXpert MRSA Assay (FDA     approved for NASAL specimens     only), is one component of a     comprehensive MRSA colonization     surveillance program. It is not     intended to diagnose MRSA     infection nor to guide or     monitor treatment for     MRSA infections.     Studies:  Recent x-ray studies have been reviewed in detail by the Attending Physician  Time spent :      Erin Hearing, [place] [facility] Office  [phone] Pager [phone]   **If unable to reach the above provider after paging please contact the [place] @ [phone]  On-Call/Text Page:      [email or web address]      password [email or web address]  If 7PM-7AM, please contact night-coverage [email or web address] Password [email or web address] 02/25/2014, 2:07 PM   LOS: 1 day  Examined patient and discussed assessment and plan with ANP Ebony Hail agree with the above plan. Spoke with grandson answered all questions. After my exam positive care met with family and it was decided patient would be made comfort care. Patient with multiple complex medical problems> 40 minutes in direct patient care

## 2014-02-25 NOTE — Progress Notes (Signed)
INITIAL NUTRITION ASSESSMENT  DOCUMENTATION CODES Per approved criteria  -Not Applicable   INTERVENTION: Advance diet as medically appropriate  RD to follow for nutrition care plan, add interventions accordingly  NUTRITION DIAGNOSIS: Inadequate oral intake related to inability to eat as evidenced by NPO status  Goal: Pt to meet >/= 90% of their estimated nutrition needs   Monitor:  PO diet advancement & intake, weight, labs, I/O's  Reason for Assessment: Low Braden  80 y.o. female  Admitting Dx: SOB  ASSESSMENT: 80 y.o. female with PMH of anxiety, smoking, depression, CAD, GERD, CHF and COPD; brought in with right abd pain. When EMS arrived, she was tachycardic, tachypneic and appeared short of breath. Heart rate was fluctuating from 107-170 with respirations in the 30s. She arrived on 15 L of oxygen.   CT scan 9/2 showed fluid near her gall bladder with a question of a leak.  Patient known to Clinical Nutrition during previous admissions; pt very confused and agitated at time of visit; niece at bedside who reports patient has a significant other who cooks "well balanced meals" for her; also repots pt has lost weight -- unable to quantify exact amount, however, estimated about 20 lbs (time frame unknown).  Low braden score places patient at risk for skin breakdown.  RD unable to complete Nutrition Focused Physical Exam at this time.  Of note, pt s/p bedside swallow evaluation 02/03/14.  SLP recommended regular texture diet with thin liquids.  Height: Ht Readings from Last 1 Encounters:  03/01/2014 5\' 6"  (1.676 m)    Weight: Wt Readings from Last 1 Encounters:  02/25/14 144 lb 2.9 oz (65.4 kg)    Ideal Body Weight: 130 lb  % Ideal Body Weight: 110%  Wt Readings from Last 10 Encounters:  02/25/14 144 lb 2.9 oz (65.4 kg)  02/05/14 138 lb 14.2 oz (63 kg)  01/19/14 135 lb 9.3 oz (61.499 kg)  01/04/14 127 lb 12.8 oz (57.97 kg)  12/14/13 133 lb (60.328 kg)  11/15/13  136 lb 14.4 oz (62.096 kg)  04/03/12 175 lb (79.379 kg)  04/03/12 175 lb (79.379 kg)  03/10/12 156 lb 1.4 oz (70.8 kg)  03/04/12 153 lb (69.4 kg)    Usual Body Weight: 133-138 lbs  % Usual Body Weight: 104-108%  BMI:  Body mass index is 23.28 kg/(m^2).  Estimated Nutritional Needs: Kcal: 1500-1700 Protein: 80-90 gm Fluid: >/= 1.5 L  Skin: Intact  Diet Order: NPO  EDUCATION NEEDS: -No education needs identified at this time   Intake/Output Summary (Last 24 hours) at 02/25/14 1206 Last data filed at 02/25/14 1000  Gross per 24 hour  Intake 266.67 ml  Output     60 ml  Net 206.67 ml    Labs:   Recent Labs Lab 03/18/2014 1119 02/25/14 0116  NA 134* 133*  K 5.2 5.8*  CL 95* 96  CO2 33* 27  BUN 15 18  CREATININE 0.59 0.59  CALCIUM 10.0 9.9  GLUCOSE 154* 147*    Scheduled Meds: . acetylcysteine  4 mL Nebulization Q6H  . albuterol  2.5 mg Nebulization Q6H  . aspirin  325 mg Oral Daily  . busPIRone  10 mg Oral TID  . enoxaparin (LOVENOX) injection  40 mg Subcutaneous Q24H  . furosemide      . LORazepam  0.25 mg Oral Daily  . LORazepam  0.5 mg Oral QHS  . morphine  15 mg Oral Q12H  . pantoprazole  40 mg Oral Daily  . piperacillin-tazobactam (ZOSYN)  IV  3.375 g Intravenous Q8H  . polyethylene glycol  17 g Oral Daily  . sertraline  50 mg Oral Daily  . vancomycin  500 mg Intravenous Q12H  . verapamil  120 mg Oral Daily    Continuous Infusions: . sodium chloride 50 mL/hr at 02/25/14 0845    Past Medical History  Diagnosis Date  . Anxiety   . Smoking   . Fibromyalgia   . Depression   . Hyperlipidemia   . Hypercalcemia   . Adrenal adenoma     Stable as of 05/2012 CT scan  . Chronic respiratory failure   . Coronary artery disease   . GERD (gastroesophageal reflux disease)   . Lymphedema syndrome, postmastectomy     RUE  . Breast CA 1970    rt breast  . CHF (congestive heart failure)   . COPD (chronic obstructive pulmonary disease)   . On home  oxygen therapy     "3L; 24/7" (11/11/2013)  . DJD (degenerative joint disease)   . Arthritis     "joints" (11/11/2013)  . Chronic back pain     "shoulders to lower back" (11/11/2013)  . Frequent UTI     Past Surgical History  Procedure Laterality Date  . Abdominal hysterectomy    . Appendectomy    . Squamous cell carcinoma excision      Floor of mouth  . Colonoscopy w/ biopsies and polypectomy    . Nasal sinus surgery  1994  . Eus  04/03/2012    Procedure: UPPER ENDOSCOPIC ULTRASOUND (EUS) LINEAR;  Surgeon: Milus Banister, MD;  Location: [facility];  Service: Endoscopy;  Laterality: N/A;  radial linear  . Mastectomy, radical Right 1970  . Breast biopsy Right ~ 1970  . Refractive surgery Bilateral ~ 2012  . Back surgery    . Posterior lumbar fusion  ?87's    Arthur Holms, RD, LDN Pager #: [phone] After-Hours Pager #: [phone]

## 2014-02-25 NOTE — Consult Note (Signed)
Patient Theresa Blackwell      DOB: 08/01/1930      HBZ:[id number]     Consult Note from the Palliative Medicine Team at [facility] Requested by: Erin Hearing, NP    PCP: Wenda Low, MD Reason for Consultation: goals of care     Phone Number:[phone]  Assessment/Recommendations: 80 yo female with multiple medical problems who presented with acute hypoxic/hypercarbic respiratoyr failure. palliative care consulted for goals of care.    Goals of Care: 1.  Code Status: DNR  2. Goals of Care Unfortunately Cherrell is doing poorly and I think transitioning toward actively dying at time of my visit this afternoon.  Her respiratory effort is poor. She is lethargic after required sedating meds and poorly responsive.  I strongly suspect her respiratoyr effort is so poor that she is again retaining CO2.  I think her prognosis could be hours to days at this point despite ongoing medical efforts. She is DNR/DNI and no plans for repeat BiPAP. Frankly BiPAP would not be indicated with her level of sedation. I spoke with son Laverna Peace via phone about above information and he plans on calling of work and coming in this evening. I expressed how it is completely reasonable to transition to comfort care at this point and my concern that despite ongoing medical currently we would not likely reverse her degree of resp decline. I also spoke with Niece Inez Catalina and her husband as well as Cambrea's significant other Quillian Quince who agree that comfort care would be reasonable at this point. If they do elect comfort care, please see my recommendations below.    Family also reports that she has been doing very poorly for month with ongoing abdominal pain despite cholecystostomy tube, functional decline, repeated hospitalizations, etc.   3. Symptom Management: related to chronic pain, agitation/delirium, dyspnea and resp abnormality - I think 5mg  of haldol is likely too much for her.  Would be cautious with benzo's given  her encephalopathy.  I will place order for haldol 1-2mg  q4h PRN. i am not concerned about her QTc of 460 with low dose haldol unlikely to cause arrythmia.  - can continue current MSSR regimen (though may be unable to take at this point). I will add prn IV morphine dose  IF they elect comfort care tonight my recommendations would be as follows: - d/c all oral meds, abx, nebs   -Start morphine continuous infusion at 1mg /hr with 2mg  PRN bolus dose q13min PRN - haldol 1-2mg  PRN anxiety agitation - Ativan 1mg  IV q2h prn severe dyspnea/agitation - d/c NRB and transition to O2 via [place] at 6L and transition down to 2L for comfort (would not titrate to O2 sat)   4. Psychosocial/Spiritual: Lives at home with significant other Quillian Quince, grandson Rachel Bo, and son Jeneen Rinks.  Niece Inez Catalina and her husband also supportive but do not see her regularly.  - Would offer chaplain services to son Laverna Peace when he arrives.    Brief HPI: Patient is an 80 yo female with PMHx of CHF, COPD on home O2, cholecystitis s/p cholecystostomy, chronic pain/fibromyalgia tube who presented on 9/2 with right sided abdominal pain and confusion.  She was found to be tachycardic, tachypnic, hypoxic with PCO2 elevated >100 on ABG, afib w/RVR.  At baseline she reportedly can only take a few steps at a time with cane and on 3L of O2 with continued tobacco abuse.  Recent admission for encephalopathy thought due to UTI vs polypharmacy. This is actually her 4th  admission since May. Here, she has had CTA which was concerning for mucous plugging.  Started on BiPAP, but did not tolerate.  Ongoing issues with encephalopathy here. Hypoxia improved to point of being on [place].  PCO2 improved with BiPAP on repeat ABG.     ROS: Unable to obtain 2/2 cognitive impairment and encephalopathy    PMH:  Past Medical History  Diagnosis Date  . Anxiety   . Smoking   . Fibromyalgia   . Depression   . Hyperlipidemia   . Hypercalcemia   . Adrenal adenoma      Stable as of 05/2012 CT scan  . Chronic respiratory failure   . Coronary artery disease   . GERD (gastroesophageal reflux disease)   . Lymphedema syndrome, postmastectomy     RUE  . Breast CA 1970    rt breast  . CHF (congestive heart failure)   . COPD (chronic obstructive pulmonary disease)   . On home oxygen therapy     "3L; 24/7" (11/11/2013)  . DJD (degenerative joint disease)   . Arthritis     "joints" (11/11/2013)  . Chronic back pain     "shoulders to lower back" (11/11/2013)  . Frequent UTI      PSH: Past Surgical History  Procedure Laterality Date  . Abdominal hysterectomy    . Appendectomy    . Squamous cell carcinoma excision      Floor of mouth  . Colonoscopy w/ biopsies and polypectomy    . Nasal sinus surgery  1994  . Eus  04/03/2012    Procedure: UPPER ENDOSCOPIC ULTRASOUND (EUS) LINEAR;  Surgeon: Milus Banister, MD;  Location: [facility] END[facility];  Service: Endoscopy;  Laterality: N/A;  radial linear  . Mastectomy, radical Right 1970  . Breast biopsy Right ~ 1970  . Refractive surgery Bilateral ~ 2012  . Back surgery    . Posterior lumbar fusion  ?1970's   I have reviewed the [place] and SH and  If appropriate update it with new information. No Known Allergies Scheduled Meds: . acetylcysteine  4 mL Nebulization Q6H  . albuterol  2.5 mg Nebulization Q6H  . aspirin  325 mg Oral Daily  . busPIRone  10 mg Oral TID  . enoxaparin (LOVENOX) injection  40 mg Subcutaneous Q24H  . LORazepam  0.25 mg Oral Daily  . LORazepam  0.5 mg Oral QHS  . mirtazapine  7.5 mg Oral QHS  . morphine  15 mg Oral Q12H  . pantoprazole  40 mg Oral Daily  . piperacillin-tazobactam (ZOSYN)  IV  3.375 g Intravenous Q8H  . polyethylene glycol  17 g Oral Daily  . sertraline  50 mg Oral Daily  . vancomycin  500 mg Intravenous Q12H  . verapamil  120 mg Oral Daily   Continuous Infusions: . sodium chloride 10 mL/hr (02/25/14 1145)  . diltiazem (CARDIZEM) infusion 5 mg/hr (02/25/14 1615)   PRN  Meds:.albuterol, LORazepam, ondansetron (ZOFRAN) IV, ondansetron    BP 96/81  Pulse 113  Temp(Src) 98.3 F (36.8 C) (Oral)  Resp 23  Ht 5\' 6"  (1.676 m)  Wt 65.4 kg (144 lb 2.9 oz)  BMI 23.28 kg/m2  SpO2 92%   PPS: 10   Intake/Output Summary (Last 24 hours) at 02/25/14 1623 Last data filed at 02/25/14 1500  Gross per 24 hour  Intake 536.67 ml  Output     60 ml  Net 476.67 ml    Physical Exam:  General: Lethargic and difficult to arouse HEENT:  [place], on NRB Chest:  Very poor resp effort with difficult to auscultate breath sounds CVS: tachy, irregular Abdomen:soft, ND, + CCX tube Ext: no edema, cool Neuro: unable to follow commands  Labs: CBC    Component Value Date/Time   WBC 9.2 02/25/2014 0116   RBC 3.74* 02/25/2014 0116   HGB 11.2* 02/25/2014 0116   HCT 38.2 02/25/2014 0116   PLT 149* 02/25/2014 0116   MCV 102.1* 02/25/2014 0116   MCH 29.9 02/25/2014 0116   MCHC 29.3* 02/25/2014 0116   RDW 14.0 02/25/2014 0116   LYMPHSABS 0.8  1119   MONOABS 0.8 03/08/2014 1119   EOSABS 0.0 03/23/2014 1119   BASOSABS 0.0 03/19/2014 1119    BMET    Component Value Date/Time   NA 137 02/25/2014 0825   K 5.8* 02/25/2014 0825   CL 98 02/25/2014 0825   CO2 28 02/25/2014 0825   GLUCOSE 134* 02/25/2014 0825   BUN 20 02/25/2014 0825   CREATININE 0.61 02/25/2014 0825   CALCIUM 10.0 02/25/2014 0825   GFRNONAA 83* 02/25/2014 0825   GFRAA >90 02/25/2014 0825    CMP     Component Value Date/Time   NA 137 02/25/2014 0825   K 5.8* 02/25/2014 0825   CL 98 02/25/2014 0825   CO2 28 02/25/2014 0825   GLUCOSE 134* 02/25/2014 0825   BUN 20 02/25/2014 0825   CREATININE 0.61 02/25/2014 0825   CALCIUM 10.0 02/25/2014 0825   PROT 6.8 03/19/2014 1119   ALBUMIN 3.3* 03/22/2014 1119   AST 23 03/02/2014 1119   ALT 16 02/25/2014 1119   ALKPHOS 80 03/13/2014 1119   BILITOT 0.4 03/18/2014 1119   GFRNONAA 83* 02/25/2014 0825   GFRAA >90 02/25/2014 0825   9/2 CXR COPD without evidence of pneumonia nor CHF. When the patient can  tolerate the  procedure, a PA and lateral chest x-ray would be  useful.   9/2 CT Head IMPRESSION:  No acute finding. Stable compared to prior exams.   9/2 CTA Chest/Abd/pelvis IMPRESSION:  1. Negative for pulmonary embolism.  2. Non loculated fluid in the right upper quadrant. Correlate with  percutaneous cholecystostomy tube output and consider tube injection  to exclude catheter malpositioning or bile leak.  3. Near complete occlusion of the bronchus intermedius, likely from  mucous plugging. Followup chest CT recommended after convalescence  to exclude endoluminal mass.  4. Cardiomegaly and mild pulmonary edema.  5. Incidental findings noted above.   9/3 CXR  IMPRESSION:  Bibasilar atelectasis versus infiltrate with suspect small pleural  effusions.   Time In: 405 Time Out: 535 Total time: 90 minutes  Greater than 50%  of this time was spent counseling and coordinating care related to the above assessment and plan   Doran Clay D.O. Palliative Medicine Team at [facility]  Pager: [phone] Team Phone: [phone]

## 2014-02-25 NOTE — Progress Notes (Signed)
Erin Hearing notified of pt agitation, trying to climb out of bed, confused, removing oxygen, pulling at lines.  Safety sitter requested.  Will continue to monitor pt closely.

## 2014-02-25 NOTE — Care Management Note (Signed)
    Page 1 of 1   02/25/2014     7:49:10 AM CARE MANAGEMENT NOTE 02/25/2014  Patient:  Theresa Blackwell, Theresa Blackwell   Account Number:  [id number]  Date Initiated:  02/25/2014  Documentation initiated by:  Elissa Hefty  Subjective/Objective Assessment:   adm w resp distress     Action/Plan:   lives at home w 24hr per day care, act w [facility], pcp dr Raliegh Ip hussain   Anticipated DC Date:     Anticipated DC Plan:        [place]  CM consult      [facility] Choice  Resumption Of Svcs/PTA Provider   Choice offered to / List presented to:          [facility] arranged  HH-1 RN  [place] agency  [place]   Status of service:   Medicare Important Message given?   (If response is "NO", the following Medicare IM given date fields will be blank) Date Medicare IM given:   Medicare IM given by:   Date Additional Medicare IM given:   Additional Medicare IM given by:    Discharge Disposition:    Per UR Regulation:  Reviewed for med. necessity/level of care/duration of stay  If discussed at [place] of Stay Meetings, dates discussed:    Comments:

## 2014-02-25 NOTE — Progress Notes (Signed)
Patient ID: Theresa Blackwell, female   DOB: [birth year], 80 y.o.   MRN: [id number]     [facility]      [address], [place] [address]    Phone: [phone] FAX: [phone]     Subjective: Afebrile.  No white count.  Acidotic, on bipap.  Confused.   Objective:  Vital signs:  Filed Vitals:   03/21/2014 2300 02/25/14 0148 02/25/14 0411 02/25/14 0419  BP: 139/84 139/84  115/56  Pulse: 80 90 87 81  Temp: 97.5 F (36.4 C)   97.6 F (36.4 C)  TempSrc: Oral   Axillary  Resp: _0 Height:      Weight:    144 lb 2.9 oz (65.4 kg)  SpO2: 97% 97% 97% 96%       Intake/Output   Yesterday:  09/02 0701 - 09/03 0700 In: 200 [IV Piggyback:200] Out: 64 [Drains:30] This shift:    I/O last 3 completed shifts: In: 200 [IV Piggyback:200] Out: 58 [Drains:30; Other:30]    Physical Exam: Abdomen: Soft.  Nondistended. Nontender.  RUQ drain, empty.  No evidence of peritonitis.  No incarcerated hernias.    Problem List:   Active Problems:   Tobacco abuse   Fibromyalgia   Acute on chronic respiratory failure with hypercapnia   Altered mental status   COPD with acute exacerbation    Results:   Labs: Results for orders placed during the hospital encounter of 02/23/2014 (from the past 48 hour(s))  CBC WITH DIFFERENTIAL     Status: Abnormal   Collection Time    02/23/2014 11:19 AM      Result Value Ref Range   WBC 8.6  4.0 - 10.5 K/uL   RBC 3.61 (*) 3.87 - 5.11 MIL/uL   Hemoglobin 10.9 (*) 12.0 - 15.0 g/dL   HCT 37.0  36.0 - 46.0 %   MCV 102.5 (*) 78.0 - 100.0 fL   MCH 30.2  26.0 - 34.0 pg   MCHC 29.5 (*) 30.0 - 36.0 g/dL   RDW 14.2  11.5 - 15.5 %   Platelets 153  150 - 400 K/uL   Neutrophils Relative % 81 (*) 43 - 77 %   Neutro Abs 7.0  1.7 - 7.7 K/uL   Lymphocytes Relative 9 (*) 12 - 46 %   Lymphs Abs 0.8  0.7 - 4.0 K/uL   Monocytes Relative 10  3 - 12 %   Monocytes Absolute 0.8  0.1 - 1.0 K/uL   Eosinophils  Relative 0  0 - 5 %   Eosinophils Absolute 0.0  0.0 - 0.7 K/uL   Basophils Relative 0  0 - 1 %   Basophils Absolute 0.0  0.0 - 0.1 K/uL  COMPREHENSIVE METABOLIC PANEL     Status: Abnormal   Collection Time    03/16/2014 11:19 AM      Result Value Ref Range   Sodium 134 (*) 137 - 147 mEq/L   Potassium 5.2  3.7 - 5.3 mEq/L   Chloride 95 (*) 96 - 112 mEq/L   CO2 33 (*) 19 - 32 mEq/L   Glucose, Bld 154 (*) 70 - 99 mg/dL   BUN 15  6 - 23 mg/dL   Creatinine, Ser 0.59  0.50 - 1.10 mg/dL   Calcium 10.0  8.4 - 10.5 mg/dL   Total Protein 6.8  6.0 - 8.3 g/dL   Albumin 3.3 (*) 3.5 - 5.2 g/dL   AST  23  0 - 37 U/L   ALT 16  0 - 35 U/L   Alkaline Phosphatase 80  39 - 117 U/L   Total Bilirubin 0.4  0.3 - 1.2 mg/dL   GFR calc non Af Amer 84 (*) >90 mL/min   GFR calc Af Amer >90  >90 mL/min   Comment: (NOTE)     The eGFR has been calculated using the CKD EPI equation.     This calculation has not been validated in all clinical situations.     eGFR's persistently <90 mL/min signify possible Chronic Kidney     Disease.   Anion gap 6  5 - 15  I-STAT CG4 LACTIC ACID, ED     Status: None   Collection Time    03/14/2014 11:25 AM      Result Value Ref Range   Lactic Acid, Venous 0.85  0.5 - 2.2 mmol/L  URINALYSIS, ROUTINE W REFLEX MICROSCOPIC     Status: Abnormal   Collection Time    03/13/2014 12:18 PM      Result Value Ref Range   Color, Urine AMBER (*) YELLOW   Comment: BIOCHEMICALS MAY BE AFFECTED BY COLOR   APPearance CLEAR  CLEAR   Specific Gravity, Urine 1.017  1.005 - 1.030   pH 5.5  5.0 - 8.0   Glucose, UA 100 (*) NEGATIVE mg/dL   Hgb urine dipstick NEGATIVE  NEGATIVE   Bilirubin Urine SMALL (*) NEGATIVE   Ketones, ur NEGATIVE  NEGATIVE mg/dL   Protein, ur NEGATIVE  NEGATIVE mg/dL   Urobilinogen, UA 1.0  0.0 - 1.0 mg/dL   Nitrite NEGATIVE  NEGATIVE   Leukocytes, UA NEGATIVE  NEGATIVE   Comment: MICROSCOPIC NOT DONE ON URINES WITH NEGATIVE PROTEIN, BLOOD, LEUKOCYTES, NITRITE, OR GLUCOSE  <1000 mg/dL.  Randolm Idol, ED     Status: None   Collection Time    03/11/2014  4:00 PM      Result Value Ref Range   Troponin i, poc 0.02  0.00 - 0.08 ng/mL   Comment 3            Comment: Due to the release kinetics of cTnI,     a negative result within the first hours     of the onset of symptoms does not rule out     myocardial infarction with certainty.     If myocardial infarction is still suspected,     repeat the test at appropriate intervals.  TROPONIN I     Status: None   Collection Time    03/05/2014  7:10 PM      Result Value Ref Range   Troponin I <0.30  <0.30 ng/mL   Comment:            Due to the release kinetics of cTnI,     a negative result within the first hours     of the onset of symptoms does not rule out     myocardial infarction with certainty.     If myocardial infarction is still suspected,     repeat the test at appropriate intervals.  PROCALCITONIN     Status: None   Collection Time    03/04/2014  7:10 PM      Result Value Ref Range   Procalcitonin <0.10     Comment:            Interpretation:     PCT (Procalcitonin) <= 0.5 ng/mL:     Systemic infection (sepsis) is  not likely.     Local bacterial infection is possible.     (NOTE)             ICU PCT Algorithm               Non ICU PCT Algorithm        ----------------------------     ------------------------------             PCT < 0.25 ng/mL                 PCT < 0.1 ng/mL         Stopping of antibiotics            Stopping of antibiotics           strongly encouraged.               strongly encouraged.        ----------------------------     ------------------------------           PCT level decrease by               PCT < 0.25 ng/mL           >= 80% from peak PCT           OR PCT 0.25 - 0.5 ng/mL          Stopping of antibiotics                                                 encouraged.         Stopping of antibiotics               encouraged.        ----------------------------      ------------------------------           PCT level decrease by              PCT >= 0.25 ng/mL           < 80% from peak PCT            AND PCT >= 0.5 ng/mL            Continuing antibiotics                                                  encouraged.           Continuing antibiotics                encouraged.        ----------------------------     ------------------------------         PCT level increase compared          PCT > 0.5 ng/mL             with peak PCT AND              PCT >= 0.5 ng/mL             Escalation of antibiotics  strongly encouraged.          Escalation of antibiotics            strongly encouraged.  MRSA PCR SCREENING     Status: None   Collection Time    02/28/2014  7:36 PM      Result Value Ref Range   MRSA by PCR NEGATIVE  NEGATIVE   Comment:            The GeneXpert MRSA Assay (FDA     approved for NASAL specimens     only), is one component of a     comprehensive MRSA colonization     surveillance program. It is not     intended to diagnose MRSA     infection nor to guide or     monitor treatment for     MRSA infections.  BLOOD GAS, ARTERIAL     Status: Abnormal   Collection Time    02/25/2014  8:00 PM      Result Value Ref Range   FIO2 0.70     Delivery systems BILEVEL POSITIVE AIRWAY PRESSURE     Inspiratory PAP 12     Expiratory PAP 5     pH, Arterial 7.105 (*) 7.350 - 7.450   Comment: CRITICAL RESULT CALLED TO, READ BACK BY AND VERIFIED WITH:     BURLESON,D RRT AT 2012 ON 03/05/2014 BY DAY,J RRT   pCO2 arterial 103.0 (*) 35.0 - 45.0 mmHg   Comment: CRITICAL RESULT CALLED TO, READ BACK BY AND VERIFIED WITH:     BURLESON,D RRT AT 2012 ON 03/17/2014 BY DAY,J RRT   pO2, Arterial 90.7  80.0 - 100.0 mmHg   Bicarbonate 30.9 (*) 20.0 - 24.0 mEq/L   TCO2 34.1  0 - 100 mmol/L   Acid-Base Excess 1.9  0.0 - 2.0 mmol/L   O2 Saturation 95.6     Patient temperature 98.6     Collection site LEFT RADIAL     Drawn by  [phone]     Sample type ARTERIAL     Allens test (pass/fail) PASS  PASS  BASIC METABOLIC PANEL     Status: Abnormal   Collection Time    02/25/14  1:16 AM      Result Value Ref Range   Sodium 133 (*) 137 - 147 mEq/L   Potassium 5.8 (*) 3.7 - 5.3 mEq/L   Chloride 96  96 - 112 mEq/L   CO2 27  19 - 32 mEq/L   Glucose, Bld 147 (*) 70 - 99 mg/dL   BUN 18  6 - 23 mg/dL   Creatinine, Ser 0.59  0.50 - 1.10 mg/dL   Calcium 9.9  8.4 - 10.5 mg/dL   GFR calc non Af Amer 84 (*) >90 mL/min   GFR calc Af Amer >90  >90 mL/min   Comment: (NOTE)     The eGFR has been calculated using the CKD EPI equation.     This calculation has not been validated in all clinical situations.     eGFR's persistently <90 mL/min signify possible Chronic Kidney     Disease.   Anion gap 10  5 - 15  CBC     Status: Abnormal   Collection Time    02/25/14  1:16 AM      Result Value Ref Range   WBC 9.2  4.0 - 10.5 K/uL   RBC 3.74 (*) 3.87 - 5.11 MIL/uL   Hemoglobin 11.2 (*) 12.0 - 15.0 g/dL  HCT 38.2  36.0 - 46.0 %   MCV 102.1 (*) 78.0 - 100.0 fL   MCH 29.9  26.0 - 34.0 pg   MCHC 29.3 (*) 30.0 - 36.0 g/dL   RDW 14.0  11.5 - 15.5 %   Platelets 149 (*) 150 - 400 K/uL  PROCALCITONIN     Status: None   Collection Time    02/25/14  1:16 AM      Result Value Ref Range   Procalcitonin <0.10     Comment:            Interpretation:     PCT (Procalcitonin) <= 0.5 ng/mL:     Systemic infection (sepsis) is not likely.     Local bacterial infection is possible.     (NOTE)             ICU PCT Algorithm               Non ICU PCT Algorithm        ----------------------------     ------------------------------             PCT < 0.25 ng/mL                 PCT < 0.1 ng/mL         Stopping of antibiotics            Stopping of antibiotics           strongly encouraged.               strongly encouraged.        ----------------------------     ------------------------------           PCT level decrease by               PCT <  0.25 ng/mL           >= 80% from peak PCT           OR PCT 0.25 - 0.5 ng/mL          Stopping of antibiotics                                                 encouraged.         Stopping of antibiotics               encouraged.        ----------------------------     ------------------------------           PCT level decrease by              PCT >= 0.25 ng/mL           < 80% from peak PCT            AND PCT >= 0.5 ng/mL            Continuing antibiotics                                                  encouraged.           Continuing antibiotics                encouraged.        ----------------------------     ------------------------------  PCT level increase compared          PCT > 0.5 ng/mL             with peak PCT AND              PCT >= 0.5 ng/mL             Escalation of antibiotics                                              strongly encouraged.          Escalation of antibiotics            strongly encouraged.  TROPONIN I     Status: None   Collection Time    02/25/14  1:17 AM      Result Value Ref Range   Troponin I <0.30  <0.30 ng/mL   Comment:            Due to the release kinetics of cTnI,     a negative result within the first hours     of the onset of symptoms does not rule out     myocardial infarction with certainty.     If myocardial infarction is still suspected,     repeat the test at appropriate intervals.    Imaging / Studies: Ct Head Wo Contrast  03/04/2014   CLINICAL DATA:  Altered mental status.  EXAM: CT HEAD WITHOUT CONTRAST  TECHNIQUE: Contiguous axial images were obtained from the base of the skull through the vertex without intravenous contrast.  COMPARISON:  Brain MRI and head CT scans 02/02/2014.  FINDINGS: Chronic microvascular ischemic change is again seen and unchanged in appearance. There is no evidence of acute intracranial abnormality including infarct, hemorrhage, mass lesion, mass effect, midline shift or abnormal extra-axial fluid  collection. No hydrocephalus or pneumocephalus. The calvarium is intact.  IMPRESSION: No acute finding.  Stable compared to prior exams.   Electronically Signed   By: Drusilla Kanner M.D.   On: 03/14/2014 14:32   Ct Angio Chest Pe W/cm &/or Wo Cm  03/19/2014   CLINICAL DATA:  The kidney a. Rule out pulmonary embolism. Right abdominal pain and possible sepsis.  EXAM: CT ANGIOGRAPHY CHEST  CT ABDOMEN AND PELVIS WITH CONTRAST  TECHNIQUE: Multidetector CT imaging of the chest was performed using the standard protocol during bolus administration of intravenous contrast. Multiplanar CT image reconstructions and MIPs were obtained to evaluate the vascular anatomy. Multidetector CT imaging of the abdomen and pelvis was performed using the standard protocol during bolus administration of intravenous contrast.  CONTRAST:  100 cc low osmolar iodinated contrast.  COMPARISON:  11/11/2013 abdominal CT.  06/05/2011 chest CT.  FINDINGS: CTA CHEST FINDINGS  THORACIC INLET/BODY WALL:  Asymmetric subcutaneous reticulation in the imaged right upper extremity presumably related to right mastectomy. No evidence of adenopathy.  Incidental laryngocele on the left, partially imaged.  MEDIASTINUM:  Cardiomegaly, especially of the atria. No pericardial effusion. Multi focal coronary atherosclerosis. No aortic dissection. No pulmonary embolism. No lymphadenopathy.  LUNG WINDOWS:  There is a bronchovascular opacity in the right upper lobe which is subpleural. Bronchial crowding consistent with volume loss. The appearance is similar 2012 and likely from radiation therapy given proximity to a right mastectomy. Near complete occlusion of the bronchus intermedius. Mottled internal density favors obstructing mucous rather than  mass. The right middle lobe bronchi are angulated and completely effaced, with lateral segment collapse predominantly. Interlobular septal thickening in multiple locations compatible with mild interstitial edema. Bronchial  wall thickening likely related to the same. No definitive pneumonia. Trace right pleural effusion.  OSSEOUS:  Radiation changes to the upper right ribs. No acute osseous findings.  CT ABDOMEN and PELVIS FINDINGS  BODY WALL: Unremarkable.  LOWER CHEST: Unremarkable.  ABDOMEN/PELVIS:  Liver: Periportal edema which is presumably reactive. This likely obscures intrahepatic biliary ductal dilation which was present previously.  Biliary: Cholecystostomy tube terminates anterior and inferior to the right liver. Gallbladder is decompressed relative to previous CT, although the walls are not clearly defined. There is unexpected fluid around the liver and porta hepatis, with thin peritoneal enhancement, best seen in the coronal projection, inferior to the liver. No mature fluid collections suggestive of abscess. There is chronic extrahepatic biliary ductal enlargement.  Pancreas: Unremarkable.  Spleen: Mild heterogeneous enhancement of the upper liver which appears vascular morphology. Ill-defined margins favors remote insult.  Adrenals: Low-density mass expanding the right adrenal gland consistent with adenoma, 18 mm in diameter.  Kidneys and ureters: No hydronephrosis or stone.  Bladder: Unremarkable.  Reproductive: Hysterectomy.  Bowel: Negative for bowel obstruction. Prominent colonic stool and gas without obstruction or definitive constipation. No evidence of bowel inflammation. Appendix not identified. No pericecal inflammation.  Retroperitoneum: No mass or adenopathy.  Peritoneum: No ascites or pneumoperitoneum.  Vascular: No acute abnormality.  OSSEOUS: No acute abnormalities. L4-5 and L5-S1 posterior fusion. Bone harvest site noted from the left pelvis.  Review of the MIP images confirms the above findings.  IMPRESSION: 1. Negative for pulmonary embolism. 2. Non loculated fluid in the right upper quadrant. Correlate with percutaneous cholecystostomy tube output and consider tube injection to exclude catheter  malpositioning or bile leak. 3. Near complete occlusion of the bronchus intermedius, likely from mucous plugging. Followup chest CT recommended after convalescence to exclude endoluminal mass. 4. Cardiomegaly and mild pulmonary edema. 5. Incidental findings noted above.   Electronically Signed   By: Jorje Guild M.D.   On: 03/09/2014 15:01   Ct Abdomen Pelvis W Contrast  03/13/2014   CLINICAL DATA:  The kidney a. Rule out pulmonary embolism. Right abdominal pain and possible sepsis.  EXAM: CT ANGIOGRAPHY CHEST  CT ABDOMEN AND PELVIS WITH CONTRAST  TECHNIQUE: Multidetector CT imaging of the chest was performed using the standard protocol during bolus administration of intravenous contrast. Multiplanar CT image reconstructions and MIPs were obtained to evaluate the vascular anatomy. Multidetector CT imaging of the abdomen and pelvis was performed using the standard protocol during bolus administration of intravenous contrast.  CONTRAST:  100 cc low osmolar iodinated contrast.  COMPARISON:  11/11/2013 abdominal CT.  06/05/2011 chest CT.  FINDINGS: CTA CHEST FINDINGS  THORACIC INLET/BODY WALL:  Asymmetric subcutaneous reticulation in the imaged right upper extremity presumably related to right mastectomy. No evidence of adenopathy.  Incidental laryngocele on the left, partially imaged.  MEDIASTINUM:  Cardiomegaly, especially of the atria. No pericardial effusion. Multi focal coronary atherosclerosis. No aortic dissection. No pulmonary embolism. No lymphadenopathy.  LUNG WINDOWS:  There is a bronchovascular opacity in the right upper lobe which is subpleural. Bronchial crowding consistent with volume loss. The appearance is similar 2012 and likely from radiation therapy given proximity to a right mastectomy. Near complete occlusion of the bronchus intermedius. Mottled internal density favors obstructing mucous rather than mass. The right middle lobe bronchi are angulated and completely effaced, with lateral segment  collapse predominantly. Interlobular septal thickening in multiple locations compatible with mild interstitial edema. Bronchial wall thickening likely related to the same. No definitive pneumonia. Trace right pleural effusion.  OSSEOUS:  Radiation changes to the upper right ribs. No acute osseous findings.  CT ABDOMEN and PELVIS FINDINGS  BODY WALL: Unremarkable.  LOWER CHEST: Unremarkable.  ABDOMEN/PELVIS:  Liver: Periportal edema which is presumably reactive. This likely obscures intrahepatic biliary ductal dilation which was present previously.  Biliary: Cholecystostomy tube terminates anterior and inferior to the right liver. Gallbladder is decompressed relative to previous CT, although the walls are not clearly defined. There is unexpected fluid around the liver and porta hepatis, with thin peritoneal enhancement, best seen in the coronal projection, inferior to the liver. No mature fluid collections suggestive of abscess. There is chronic extrahepatic biliary ductal enlargement.  Pancreas: Unremarkable.  Spleen: Mild heterogeneous enhancement of the upper liver which appears vascular morphology. Ill-defined margins favors remote insult.  Adrenals: Low-density mass expanding the right adrenal gland consistent with adenoma, 18 mm in diameter.  Kidneys and ureters: No hydronephrosis or stone.  Bladder: Unremarkable.  Reproductive: Hysterectomy.  Bowel: Negative for bowel obstruction. Prominent colonic stool and gas without obstruction or definitive constipation. No evidence of bowel inflammation. Appendix not identified. No pericecal inflammation.  Retroperitoneum: No mass or adenopathy.  Peritoneum: No ascites or pneumoperitoneum.  Vascular: No acute abnormality.  OSSEOUS: No acute abnormalities. L4-5 and L5-S1 posterior fusion. Bone harvest site noted from the left pelvis.  Review of the MIP images confirms the above findings.  IMPRESSION: 1. Negative for pulmonary embolism. 2. Non loculated fluid in the right  upper quadrant. Correlate with percutaneous cholecystostomy tube output and consider tube injection to exclude catheter malpositioning or bile leak. 3. Near complete occlusion of the bronchus intermedius, likely from mucous plugging. Followup chest CT recommended after convalescence to exclude endoluminal mass. 4. Cardiomegaly and mild pulmonary edema. 5. Incidental findings noted above.   Electronically Signed   By: Jorje Guild M.D.   On: 03/20/2014 15:01   Dg Chest Port 1 View  03/06/2014   CLINICAL DATA:  Sepsis with tachycardia and to kidney a and mental status change  EXAM: PORTABLE CHEST - 1 VIEW  COMPARISON:  PA and lateral chest of February 02, 2014  FINDINGS: The patient is rotated on this study. The lungs are hyperinflated. There is no focal infiltrate. There is minimal prominence of the interstitial markings which is not new. The cardiopericardial silhouette is top-normal in size. The pulmonary vascularity is not engorged. The bony thorax is unremarkable.  IMPRESSION: COPD without evidence of pneumonia nor CHF. When the patient can tolerate the procedure, a PA and lateral chest x-ray would be useful.   Electronically Signed   By: David  Martinique   On: 03/15/2014 11:23    Medications / Allergies:  Scheduled Meds: . acetylcysteine  4 mL Nebulization Q6H  . albuterol  2.5 mg Nebulization Q6H  . enoxaparin (LOVENOX) injection  40 mg Subcutaneous Q24H  . methylPREDNISolone (SOLU-MEDROL) injection  80 mg Intravenous Q6H  . piperacillin-tazobactam (ZOSYN)  IV  3.375 g Intravenous Q8H  . vancomycin  500 mg Intravenous Q12H   Continuous Infusions:  PRN Meds:.albuterol, ondansetron (ZOFRAN) IV, ondansetron  Antibiotics: Anti-infectives   Start     Dose/Rate Route Frequency Ordered Stop   02/25/14 0001  vancomycin (VANCOCIN) 500 mg in sodium chloride 0.9 % 100 mL IVPB     500 mg 100 mL/hr over 60 Minutes Intravenous Every 12 hours 02/23/2014  1110     03/16/2014 2000  piperacillin-tazobactam  (ZOSYN) IVPB 3.375 g     3.375 g 12.5 mL/hr over 240 Minutes Intravenous Every 8 hours 03/04/2014 1110     03/08/2014 1100  piperacillin-tazobactam (ZOSYN) IVPB 3.375 g     3.375 g 100 mL/hr over 30 Minutes Intravenous  Once 03/05/2014 1055 03/13/2014 1518   03/22/2014 1100  vancomycin (VANCOCIN) IVPB 1000 mg/200 mL premix     1,000 mg 200 mL/hr over 60 Minutes Intravenous  Once 03/06/2014 1055 02/28/2014 1333        Assessment/Plan Acute on chronic respiratory failure-on BiPAP COPD Tobacco abuse Altered mental status  S/p percutaneous cholecystostomy tube-IR to check drain placement today.  Further recommendations based on the results.  Will follow along.   Erby Pian, [facility] Pager [phone]) For consults and floor pages call [phone]([id number])  02/25/2014 7:40 AM

## 2014-02-25 NOTE — Consult Note (Signed)
PULMONARY / CRITICAL CARE MEDICINE  Name: Theresa Blackwell MRN: [id number] DOB: 1930/12/24    ADMISSION DATE:  03/14/2014 CONSULTATION DATE:  03/11/2014  REFERRING MD :  EDP PRIMARY SERVICE:  TRH  CHIEF COMPLAINT:  Abdominal pain  BRIEF PATIENT DESCRIPTION: 80 yo active smoker with COPD (on 3L oxygen) and percutaneous gall bladder drain presented to [facility] 9/2 complaining of R sided abdominal pain and dyspnea. She was found to be tachycardic and in moderate respiratory distress requiring BiPAP. CT of chest revealed bronchus intermedius likely obstructed by mucus.  SIGNIFICANT EVENTS / STUDIES:  9/2 CT head >>> nad 9/2 CTA chest/abd/pelvis >>> No PE, non loculated fluid in RUQ, near complete occlusion of bronchus intermedius, mild pulmonary edema  LINES / TUBES:  CULTURES: 9/2 Blood >>> 9/2 Urine >>>   ANTIBIOTICS: Zosyn 9/2 >>> Vancomycin 9/2 >>>  INTERVAL HISTORY: agitation  VITAL SIGNS: Temp:  [96.3 F (35.7 C)-98.3 F (36.8 C)] 98.3 F (36.8 C) (09/03 1229) Pulse Rate:  [59-152] 113 (09/03 1229) Resp:  [14-38] 23 (09/03 1229) BP: (96-139)/(34-99) 96/81 mmHg (09/03 1331) SpO2:  [90 %-100 %] 90 % (09/03 1229) FiO2 (%):  [50 %-70 %] 50 % (09/03 0902) Weight:  [65.4 kg (144 lb 2.9 oz)] 65.4 kg (144 lb 2.9 oz) (09/03 0419)  PHYSICAL EXAMINATION: General:  Frail, chronically ill appearing female, agitated  Neuro:  trying to get out of bed, stronger , moving al lext HEENT:  Mm dry, bipap off Cardiovascular:  s1s2 rrr Lungs:  Scattered ronchi Abdomen:  Soft, round, R sided perc drain Musculoskeletal:  Cool, dry, no edema  LABS: CBC  Recent Labs Lab 03/20/2014 1119 02/25/14 0116  WBC 8.6 9.2  HGB 10.9* 11.2*  HCT 37.0 38.2  PLT 153 149*   Coag's No results found for this basename: APTT, INR,  in the last 168 hours  BMET  Recent Labs Lab 03/02/2014 1119 02/25/14 0116 02/25/14 0825  NA 134* 133* 137  K 5.2 5.8* 5.8*  CL 95* 96 98  CO2 33* 27 28  BUN 15 18 20    CREATININE 0.59 0.59 0.61  GLUCOSE 154* 147* 134*   Electrolytes  Recent Labs Lab 03/22/2014 1119 02/25/14 0116 02/25/14 0825  CALCIUM 10.0 9.9 10.0   Sepsis Markers  Recent Labs Lab 03/04/2014 1125 03/16/2014 1910 02/25/14 0116  LATICACIDVEN 0.85  --   --   PROCALCITON  --  <0.10 <0.10   ABG  Recent Labs Lab 03/01/2014 2000 02/25/14 0858  PHART 7.105* 7.338*  PCO2ART 103.0* 64.4*  PO2ART 90.7 153.0*    Liver Enzymes  Recent Labs Lab 03/24/2014 1119  AST 23  ALT 16  ALKPHOS 80  BILITOT 0.4  ALBUMIN 3.3*   Cardiac Enzymes  Recent Labs Lab 03/05/2014 1910 02/25/14 0117 02/25/14 0925  TROPONINI <0.30 <0.30 <0.30    Glucose No results found for this basename: GLUCAP,  in the last 168 hours  IMAGING:  Ct Head Wo Contrast  03/13/2014   CLINICAL DATA:  Altered mental status.  EXAM: CT HEAD WITHOUT CONTRAST  TECHNIQUE: Contiguous axial images were obtained from the base of the skull through the vertex without intravenous contrast.  COMPARISON:  Brain MRI and head CT scans 02/02/2014.  FINDINGS: Chronic microvascular ischemic change is again seen and unchanged in appearance. There is no evidence of acute intracranial abnormality including infarct, hemorrhage, mass lesion, mass effect, midline shift or abnormal extra-axial fluid collection. No hydrocephalus or pneumocephalus. The calvarium is intact.  IMPRESSION: No acute finding.  Stable compared to prior exams.   Electronically Signed   By: Inge Rise M.D.   On: 02/23/2014 14:32   Ct Angio Chest Pe W/cm &/or Wo Cm  03/23/2014   CLINICAL DATA:  The kidney a. Rule out pulmonary embolism. Right abdominal pain and possible sepsis.  EXAM: CT ANGIOGRAPHY CHEST  CT ABDOMEN AND PELVIS WITH CONTRAST  TECHNIQUE: Multidetector CT imaging of the chest was performed using the standard protocol during bolus administration of intravenous contrast. Multiplanar CT image reconstructions and MIPs were obtained to evaluate the vascular  anatomy. Multidetector CT imaging of the abdomen and pelvis was performed using the standard protocol during bolus administration of intravenous contrast.  CONTRAST:  100 cc low osmolar iodinated contrast.  COMPARISON:  11/11/2013 abdominal CT.  06/05/2011 chest CT.  FINDINGS: CTA CHEST FINDINGS  THORACIC INLET/BODY WALL:  Asymmetric subcutaneous reticulation in the imaged right upper extremity presumably related to right mastectomy. No evidence of adenopathy.  Incidental laryngocele on the left, partially imaged.  MEDIASTINUM:  Cardiomegaly, especially of the atria. No pericardial effusion. Multi focal coronary atherosclerosis. No aortic dissection. No pulmonary embolism. No lymphadenopathy.  LUNG WINDOWS:  There is a bronchovascular opacity in the right upper lobe which is subpleural. Bronchial crowding consistent with volume loss. The appearance is similar 2012 and likely from radiation therapy given proximity to a right mastectomy. Near complete occlusion of the bronchus intermedius. Mottled internal density favors obstructing mucous rather than mass. The right middle lobe bronchi are angulated and completely effaced, with lateral segment collapse predominantly. Interlobular septal thickening in multiple locations compatible with mild interstitial edema. Bronchial wall thickening likely related to the same. No definitive pneumonia. Trace right pleural effusion.  OSSEOUS:  Radiation changes to the upper right ribs. No acute osseous findings.  CT ABDOMEN and PELVIS FINDINGS  BODY WALL: Unremarkable.  LOWER CHEST: Unremarkable.  ABDOMEN/PELVIS:  Liver: Periportal edema which is presumably reactive. This likely obscures intrahepatic biliary ductal dilation which was present previously.  Biliary: Cholecystostomy tube terminates anterior and inferior to the right liver. Gallbladder is decompressed relative to previous CT, although the walls are not clearly defined. There is unexpected fluid around the liver and porta  hepatis, with thin peritoneal enhancement, best seen in the coronal projection, inferior to the liver. No mature fluid collections suggestive of abscess. There is chronic extrahepatic biliary ductal enlargement.  Pancreas: Unremarkable.  Spleen: Mild heterogeneous enhancement of the upper liver which appears vascular morphology. Ill-defined margins favors remote insult.  Adrenals: Low-density mass expanding the right adrenal gland consistent with adenoma, 18 mm in diameter.  Kidneys and ureters: No hydronephrosis or stone.  Bladder: Unremarkable.  Reproductive: Hysterectomy.  Bowel: Negative for bowel obstruction. Prominent colonic stool and gas without obstruction or definitive constipation. No evidence of bowel inflammation. Appendix not identified. No pericecal inflammation.  Retroperitoneum: No mass or adenopathy.  Peritoneum: No ascites or pneumoperitoneum.  Vascular: No acute abnormality.  OSSEOUS: No acute abnormalities. L4-5 and L5-S1 posterior fusion. Bone harvest site noted from the left pelvis.  Review of the MIP images confirms the above findings.  IMPRESSION: 1. Negative for pulmonary embolism. 2. Non loculated fluid in the right upper quadrant. Correlate with percutaneous cholecystostomy tube output and consider tube injection to exclude catheter malpositioning or bile leak. 3. Near complete occlusion of the bronchus intermedius, likely from mucous plugging. Followup chest CT recommended after convalescence to exclude endoluminal mass. 4. Cardiomegaly and mild pulmonary edema. 5. Incidental findings noted above.   Electronically Signed  By: Jorje Guild M.D.   On: 03/24/2014 15:01   Ct Abdomen Pelvis W Contrast     CLINICAL DATA:  The kidney a. Rule out pulmonary embolism. Right abdominal pain and possible sepsis.  EXAM: CT ANGIOGRAPHY CHEST  CT ABDOMEN AND PELVIS WITH CONTRAST  TECHNIQUE: Multidetector CT imaging of the chest was performed using the standard protocol during bolus  administration of intravenous contrast. Multiplanar CT image reconstructions and MIPs were obtained to evaluate the vascular anatomy. Multidetector CT imaging of the abdomen and pelvis was performed using the standard protocol during bolus administration of intravenous contrast.  CONTRAST:  100 cc low osmolar iodinated contrast.  COMPARISON:  11/11/2013 abdominal CT.  06/05/2011 chest CT.  FINDINGS: CTA CHEST FINDINGS  THORACIC INLET/BODY WALL:  Asymmetric subcutaneous reticulation in the imaged right upper extremity presumably related to right mastectomy. No evidence of adenopathy.  Incidental laryngocele on the left, partially imaged.  MEDIASTINUM:  Cardiomegaly, especially of the atria. No pericardial effusion. Multi focal coronary atherosclerosis. No aortic dissection. No pulmonary embolism. No lymphadenopathy.  LUNG WINDOWS:  There is a bronchovascular opacity in the right upper lobe which is subpleural. Bronchial crowding consistent with volume loss. The appearance is similar 2012 and likely from radiation therapy given proximity to a right mastectomy. Near complete occlusion of the bronchus intermedius. Mottled internal density favors obstructing mucous rather than mass. The right middle lobe bronchi are angulated and completely effaced, with lateral segment collapse predominantly. Interlobular septal thickening in multiple locations compatible with mild interstitial edema. Bronchial wall thickening likely related to the same. No definitive pneumonia. Trace right pleural effusion.  OSSEOUS:  Radiation changes to the upper right ribs. No acute osseous findings.  CT ABDOMEN and PELVIS FINDINGS  BODY WALL: Unremarkable.  LOWER CHEST: Unremarkable.  ABDOMEN/PELVIS:  Liver: Periportal edema which is presumably reactive. This likely obscures intrahepatic biliary ductal dilation which was present previously.  Biliary: Cholecystostomy tube terminates anterior and inferior to the right liver. Gallbladder is  decompressed relative to previous CT, although the walls are not clearly defined. There is unexpected fluid around the liver and porta hepatis, with thin peritoneal enhancement, best seen in the coronal projection, inferior to the liver. No mature fluid collections suggestive of abscess. There is chronic extrahepatic biliary ductal enlargement.  Pancreas: Unremarkable.  Spleen: Mild heterogeneous enhancement of the upper liver which appears vascular morphology. Ill-defined margins favors remote insult.  Adrenals: Low-density mass expanding the right adrenal gland consistent with adenoma, 18 mm in diameter.  Kidneys and ureters: No hydronephrosis or stone.  Bladder: Unremarkable.  Reproductive: Hysterectomy.  Bowel: Negative for bowel obstruction. Prominent colonic stool and gas without obstruction or definitive constipation. No evidence of bowel inflammation. Appendix not identified. No pericecal inflammation.  Retroperitoneum: No mass or adenopathy.  Peritoneum: No ascites or pneumoperitoneum.  Vascular: No acute abnormality.  OSSEOUS: No acute abnormalities. L4-5 and L5-S1 posterior fusion. Bone harvest site noted from the left pelvis.  Review of the MIP images confirms the above findings.  IMPRESSION: 1. Negative for pulmonary embolism. 2. Non loculated fluid in the right upper quadrant. Correlate with percutaneous cholecystostomy tube output and consider tube injection to exclude catheter malpositioning or bile leak. 3. Near complete occlusion of the bronchus intermedius, likely from mucous plugging. Followup chest CT recommended after convalescence to exclude endoluminal mass. 4. Cardiomegaly and mild pulmonary edema. 5. Incidental findings noted above.   Electronically Signed   By: Jorje Guild M.D.   On: 03/20/2014 15:01   Dg Chest  Port 1 View  02/25/2014   CLINICAL DATA:  RIGHT mucus plugging, followup, history shortness of breath, smoking, COPD, BiPAP, hypertension, GERD  EXAM: PORTABLE CHEST - 1 VIEW   COMPARISON:  Portable exam 0916 hr compared to 03/03/2014  FINDINGS: Enlargement of cardiac silhouette with pulmonary vascular congestion.  Question layered effusions at the lung bases bilaterally.  Bibasilar atelectasis versus infiltrate increased since previous exam.  Upper lungs clear.  No pneumothorax.  Bones demineralized.  IMPRESSION: Bibasilar atelectasis versus infiltrate with suspect small pleural effusions.   Electronically Signed   By: Lavonia Dana M.D.   On: 02/25/2014 10:01   Dg Chest Port 1 View  03/01/2014   CLINICAL DATA:  Sepsis with tachycardia and to kidney a and mental status change  EXAM: PORTABLE CHEST - 1 VIEW  COMPARISON:  PA and lateral chest of February 02, 2014  FINDINGS: The patient is rotated on this study. The lungs are hyperinflated. There is no focal infiltrate. There is minimal prominence of the interstitial markings which is not new. The cardiopericardial silhouette is top-normal in size. The pulmonary vascularity is not engorged. The bony thorax is unremarkable.  IMPRESSION: COPD without evidence of pneumonia nor CHF. When the patient can tolerate the procedure, a PA and lateral chest x-ray would be useful.   Electronically Signed   By: David  Martinique   On: 03/06/2014 11:23    ASSESSMENT / PLAN:  PULMONARY A: Acute on chronic hypercarbic / hypoxemic respiratory failure AECOPD Bronchus intermedius obstruction with secretions Pulmonary edema Tobacco use disorder Possible HCAP P:   Goal SpO2>92 BiPAP not compliant, dc , unlikely to change outcome Do NOT intubate noted Albuterol / Mucomyst x 48 hr then dc mucomysts, follow for wheezing Chest vest PT Trend ABG / CXR Solu-Medrol dc, no wheezing Bronchoscopy would worsen her status Consider lasix Treat rate heart  CARDIOVASCULAR A:  AF RVR Acute on chronic diastolic heart failure P:  Avoid volume overload, consider lasix Keep control rate DNR  RENAL A: Hyponatremia  P:   Trend BMP Consider  lasix  GASTROINTESTINAL A:   Cholecystitis s/p cholecystostomy tube GI Px is not required P:   Per surgery  For IR eval perc drain 9/3, if resp status allows NPO  HEMATOLOGIC Anemia VTE Px P:  Trend CBC Lovenox  INFECTIOUS A:   Possible HCAP P:   Cultures as above  Vancomycin / Zosyn 9/2 >>> Consider deescalating to Levaquin for AECOPD PCT neg x 2 , if in am neg, consider dc all abx  ENDOCRINE A:   Adrenal adenoma P:   No intervention required  NEUROLOGIC A:  Acute encephalopathy in setting of hypercarbia P:   Avoid sedatives / opioids  Global: not tolerating or using NIMV, dc, mucumysts stop date in place, for IR if able, consider comfort care  Lavon Paganini. Titus Mould, MD, [place] Pgr: [facility]

## 2014-02-25 NOTE — Progress Notes (Signed)
Dr. Sherral Hammers notified of family request for comfort care for pt.   Will continue to monitor pt closely.

## 2014-02-25 NOTE — Progress Notes (Signed)
Pt awake, agitated, pushing and kicking at [employer].  Pt removing bipap, refusing to put it back on. Placed on nasal cannula. Will continue to monitor pt closely.

## 2014-02-25 NOTE — Progress Notes (Signed)
EKG CRITICAL VALUE     12 lead EKG performed.  Critical value noted. R Collum, RN notified.   Zabian Swayne L, CCT 02/25/2014 3:46 PM

## 2014-02-25 NOTE — Progress Notes (Signed)
Dr. Sherral Hammers notified of low blood pressures on cardizem drip (no bolus given), and cardizem drip stopped.  Orders received.  Will continue to monitor pt closely.

## 2014-02-26 DIAGNOSIS — F411 Generalized anxiety disorder: Secondary | ICD-10-CM

## 2014-02-26 MED ORDER — FUROSEMIDE 10 MG/ML IJ SOLN
40.0000 mg | Freq: Two times a day (BID) | INTRAMUSCULAR | Status: DC
  Administered 2014-02-26 (×2): 40 mg via INTRAVENOUS
  Filled 2014-02-26 (×2): qty 4

## 2014-02-26 NOTE — Progress Notes (Addendum)
Patient Theresa Blackwell      DOB: [birth year]      YWV:[id number]   Palliative Medicine Team at [facility] Progress Note    Subjective: Unresponsive. Family at bedside.     Filed Vitals:   03/02/2014 0400  BP: 111/35  Pulse: 70  Temp:   Resp: 13   Physical exam: GEN: unresponsive, on NRB HEENT: [place], dry mm CV: regular rate LUNGS: drastically decreased breath sounds with poor air movement ABD: soft, ND EXT: cool   Assessment and plan:  80 yo female with multiple medical problems who presented with acute hypoxic/hypercarbic respiratoyr failure. palliative care consulted for goals of care.   1. Code Status: DNR   2. Goals of Care  Comfort.  Spoke with family at bedside who feel like she is comfortable through night. They agree with my recommendation to transition NRB to [place].  Suspect prognosis is in hours to days but perhaps more likely hours  3. Symptom Management: related to chronic pain, agitation/delirium, dyspnea and resp abnormality  -On morphine gtt at 1mg /hr with 1 PRN bolus dose needed since last night. Would continue - Transition to nasal cannula for comfort - I will d/c scheduled nebs as she is unlikely to receive any benefit at this point - i will also d/c asa and lovenox (no benefit) - continue PRN haldol and ativan - d/c SCD's - daily vitals, does not need continuous O2 monitoring or heart monitoring.  - would be reasonable to transfer to floor bed  4. Psychosocial/Spiritual:  Lives at home with significant other Quillian Quince, grandson Rachel Bo, and son Jeneen Rinks. Niece Inez Catalina and her husband also supportive but do not see her regularly.   Total Time: 25 minutes with >50% of time spent in counseling and coordination of care regarding above plan.   Doran Clay D.O. Palliative Medicine Team at [facility]  Pager: [phone] Team Phone: [phone]

## 2014-02-26 NOTE — Progress Notes (Signed)
Patient ID: Theresa Blackwell, female   DOB: 09/12/1930, 80 y.o.   MRN: [id number] Agree with comfort Will sign off Lavon Paganini. Titus Mould, MD, [place] Pgr: [facility]

## 2014-02-26 NOTE — Progress Notes (Signed)
[facility]  Theresa Blackwell HBZ:[id number] DOB: [birth year] DOA: 03/01/2014 PCP: Wenda Low, MD   Brief narrative: 80 year old female patient with multiple medical problems. Presented to the emergency department because of shortness of breath. Apparently had also been complaining of abdominal pain prior to arrival to the emergency department. EMS was called to the home where they found the patient to be tachycardic, tachypneic and dyspneic. Her heart rate was fluctuating between 107 and 170 beats per minute with respirations in the 30s. She was placed on 100% nonrebreather. Her baseline status is dementia with 24-hour caregivers. She has limited mobility with a cane walking only a few steps at a time. She is on chronic nasal cannula oxygen at 3 L and continues to smoke cigarettes.  After arrival to the emergency department she was given nebulizer treatments without any improvement in her respiratory symptoms. She was noted to be very diminished in the right lung region She had no leukocytosis. Chest x-ray was consistent with COPD without evidence of pneumonia or CHF. She was in nature fibrillation with RVR. CT of the chest did not demonstrate PE. Because of ongoing dyspnea she required BiPAP. Subsequent CT of the chest was concerning for mucous plug in the bronchus intermedius. Pulmonary medicine was consulted who evaluated the patient. Because of her tenuous respiratory status and rest for intubation bronchoscopy was deferred and conservative medical therapy with pulmonary toileting, supportive care and IV steroids was initiated. General surgery was also consulted regarding the patient's current cholecystostomy tube. She was given Cardizem IV for her atrial fibrillation with RVR. By the time the admitting physician arrived to evaluate the patient her atrial fibrillation had resolved and she was maintaining sinus rhythm.  After admission patient remained lethargic on  the BiPAP including during initial morning rounds. By later in the morning patient became very alert, pulled off her BiPAP, was confused and very restless. Her usual home medications were resumed except for her Lyrica. It was clarified that her fentanyl patch had been discontinued at least one month prior. Followup chest x-ray at this time was concerning for bilateral pulmonary edema with evolving pleural effusions. ABG revealed mildly persistent but greatly improved respiratory acidosis. Because of her agitation and no wheezing on exam her steroids were discontinued.  On 9/3 in the afternoon palliative medicine met with the family and given patient's progressive decline the focus was shifted from aggressive care to comfort measures. Patient has subsequently been started on a low-dose morphine infusion with when necessary IV Haldol needed for any agitation. Plan is to transition to a palliative care floor for end-of-life care.  HPI/Subjective: Sedated and appears comfortable at this time  Assessment/Plan:    Acute on chronic respiratory failure with hypercapnia due to:   A) Mucous plug bronchus intermedia    B) Pulmonary edema   C) COPD with acute exacerbation   D) Chronic combined systolic and diastolic CHF, NYHA class 1 -Mucous plug appeared to be the precipitating event and had resolved based on clinical exam on 9/3. She was no longer wheezing so COPD exacerbation appeared transient as well so steroids were stopped. No definitive infiltrate on chest x-ray and no fever or leukocytosis at presentation so doubt pneumonia process. Exam 9/3 was more consistent with flash pulmonary edema as evidenced by JVD and changes on repeat x-ray therefore she was given a one-time dose of Lasix 60 mg and for comfort has been ordered twice a day IV Lasix for a  total of 4 doses. Later on 9/3 after discussion with palliative team the family decided to transition to comfort care.   End-of-life palliative care    -Continue morphine infusion and titrate as needed at discretion of palliative medicine team. Also has when necessary IV Haldol and Ativan ordered for agitation and other symptoms such as air hunger    Gallbladder gangrene s/p cholecystostomy tube -Agree with surgery at this time since focus is on comfort no indication for study percutaneous drain but will leave drain and as a comfort measure    Atrial fibrillation with RVR -Recurred on 9/3 during episode of agitation; blood pressure dropped with initiation of diltiazem drip and since focus was on comfort diltiazem drip was discontinued-now but sedated and comfortable is maintaining control of ventricular rates of 70s    Tobacco abuse -Place nicotine patch for comfort    Fibromyalgia/chronic pain syndrome -As above  DVT prophylaxis: Lovenox discontinued since comfort focus Code Status: DO NOT RESUSCITATE Family Communication: multiple family members at bedside updated Disposition Plan/Expected LOS: Transfer to palliative bed  Consultants: Surgery PCCM [facility] (Pallia[facility] Care)  Antibiotics: Zosyn 9/2 >>> stopped 9/3 Vancomycin 9/2 >>> stopped 9/3  Objective: Blood pressure 98/55, pulse 137, temperature 97.3 F (36.3 C), temperature source Axillary, resp. rate 18, height 5' 6" (1.676 m), weight 144 lb 2.9 oz (65.4 kg), SpO2 94.00%.  Intake/Output Summary (Last 24 hours) at 03/14/2014 1543 Last data filed at 02/23/2014 1400  Gross per 24 hour  Intake 363.02 ml  Output    795 ml  Net -431.98 ml   Exam: Gen: In no acute distress and appears comfortable and sedated Chest: Coarse to auscultation bilaterally without wheezes, rhonchi or crackles, 6 L  Scheduled Meds:  Scheduled Meds: . albuterol  2.5 mg Nebulization Q6H  . furosemide  40 mg Intravenous BID   Continuous Infusions: . sodium chloride 15 mL/hr at 03/15/2014 0800  . morphine 1 mg/hr (03/17/2014 0800)    Data Reviewed: Basic Metabolic Panel:  Recent  Labs Lab 03/18/2014 1119 02/25/14 0116 02/25/14 0825 02/25/14 2027  NA 134* 133* 137 138  K 5.2 5.8* 5.8* 5.3  CL 95* 96 98 99  CO2 33* 27 28 31  GLUCOSE 154* 147* 134* 114*  BUN 15 18 20 27*  CREATININE 0.59 0.59 0.61 0.62  CALCIUM 10.0 9.9 10.0 9.8   Liver Function Tests:  Recent Labs Lab 03/19/2014 1119  AST 23  ALT 16  ALKPHOS 80  BILITOT 0.4  PROT 6.8  ALBUMIN 3.3*   CBC:  Recent Labs Lab 03/17/2014 1119 02/25/14 0116  WBC 8.6 9.2  NEUTROABS 7.0  --   HGB 10.9* 11.2*  HCT 37.0 38.2  MCV 102.5* 102.1*  PLT 153 149*   Cardiac Enzymes:  Recent Labs Lab 03/12/2014 1910 02/25/14 0117 02/25/14 0925  TROPONINI <0.30 <0.30 <0.30    Recent Results (from the past 240 hour(s))  CULTURE, BLOOD (ROUTINE X 2)     Status: None   Collection Time    03/01/2014 11:03 AM      Result Value Ref Range Status   Specimen Description BLOOD LEFT FOREARM   Final   Special Requests BOTTLES DRAWN AEROBIC ONLY 5CC   Final   Culture  Setup Time     Final   Value: 03/11/2014 17:15     Performed at [facility]   Culture     Final   Value:        BLOOD CULTURE RECEIVED   NO GROWTH TO DATE CULTURE WILL BE HELD FOR 5 DAYS BEFORE ISSUING A FINAL NEGATIVE REPORT     Performed at [facility]   Report Status PENDING   Incomplete  CULTURE, BLOOD (ROUTINE X 2)     Status: None   Collection Time    03/10/2014 11:17 AM      Result Value Ref Range Status   Specimen Description BLOOD LEFT HAND   Final   Special Requests BOTTLES DRAWN AEROBIC AND ANAEROBIC 5CC   Final   Culture  Setup Time     Final   Value: 03/12/2014 17:13     Performed at [facility]   Culture     Final   Value:        BLOOD CULTURE RECEIVED NO GROWTH TO DATE CULTURE WILL BE HELD FOR 5 DAYS BEFORE ISSUING A FINAL NEGATIVE REPORT     Performed at [facility]   Report Status PENDING   Incomplete  URINE CULTURE     Status: None   Collection Time    02/23/2014 12:16 PM      Result Value Ref  Range Status   Specimen Description URINE, CATHETERIZED   Final   Special Requests NONE   Final   Culture  Setup Time     Final   Value: 03/23/2014 17:31     Performed at [facility] Count     Final   Value: NO GROWTH     Performed at [facility]   Culture     Final   Value: NO GROWTH     Performed at [facility]   Report Status 02/25/2014 FINAL   Final  MRSA PCR SCREENING     Status: None   Collection Time    03/18/2014  7:36 PM      Result Value Ref Range Status   MRSA by PCR NEGATIVE  NEGATIVE Final   Comment:            The GeneXpert MRSA Assay (FDA     approved for NASAL specimens     only), is one component of a     comprehensive MRSA colonization     surveillance program. It is not     intended to diagnose MRSA     infection nor to guide or     monitor treatment for     MRSA infections.     Studies:  Recent x-ray studies have been reviewed in detail by the Attending Physician  Time spent :  25 mins       Erin Hearing, ANP [facility] Office  [phone] Pager [phone]   **If unable to reach the above provider after paging please contact the [facility] @ [phone]  On-Call/Text Page:      [email or web address]      password [email or web address]  If 7PM-7AM, please contact night-coverage [email or web address] Password [email or web address] 03/18/2014, 3:43 PM   LOS: 2 days   I have personally examined this patient and reviewed the entire database. I have reviewed the above note, made any necessary editorial changes, and agree with its content.  Cherene Altes, MD [facility]

## 2014-02-26 NOTE — Progress Notes (Signed)
Patient ID: Theresa Blackwell, female   DOB: 1931/04/28, 80 y.o.   MRN: [id number]     [facility]      [address] [place], [place] [address]    Phone: [phone] FAX: [phone]     Subjective: Appears more comfortable today.  On NRB.   Objective:  Vital signs:  Filed Vitals:   02/25/14 2000 02/25/14 2012 03/13/2014 0000 03/11/2014 0400  BP: 99/57 99/57 102/48 111/35  Pulse: 121 106 132 70  Temp:  97.7 F (36.5 C) 97.9 F (36.6 C)   TempSrc:  Axillary Axillary   Resp: _0 Height:      Weight:      SpO2: 100% 100% 100% 100%       Intake/Output   Yesterday:  09/03 0701 - 09/04 0700 In: 567.7 [I.V.:417.7; IV Piggyback:150] Out: 1150 [Urine:950; Drains:200] This shift:    I/O last 3 completed shifts: In: 767.7 [I.V.:417.7; IV Piggyback:350] Out: 1150 [Urine:950; Drains:200]    Physical Exam:  Abdomen: Soft. Nondistended. Nontender. RUQ drain with bilious output.  No evidence of peritonitis. No incarcerated hernias.    Problem List:   Active Problems:   Tobacco abuse   Fibromyalgia   Gallbladder gangrene s/p cholecystostomy tube   Acute on chronic respiratory failure with hypercapnia   Metabolic encephalopathy   COPD with acute exacerbation   Atrial fibrillation with RVR   Pulmonary edema   Hyperkalemia   Chronic combined systolic and diastolic CHF, NYHA class 1    Results:   Labs: Results for orders placed during the hospital encounter of 03/22/2014 (from the past 48 hour(s))  CULTURE, BLOOD (ROUTINE X 2)     Status: None   Collection Time    02/23/2014 11:03 AM      Result Value Ref Range   Specimen Description BLOOD LEFT FOREARM     Special Requests BOTTLES DRAWN AEROBIC ONLY 5CC     Culture  Setup Time       Value: 03/13/2014 17:15     Performed at [facility]   Culture       Value:        BLOOD CULTURE RECEIVED NO GROWTH TO DATE CULTURE WILL BE HELD FOR 5 DAYS BEFORE ISSUING A FINAL  NEGATIVE REPORT     Performed at [facility]   Report Status PENDING    CULTURE, BLOOD (ROUTINE X 2)     Status: None   Collection Time    03/08/2014 11:17 AM      Result Value Ref Range   Specimen Description BLOOD LEFT HAND     Special Requests BOTTLES DRAWN AEROBIC AND ANAEROBIC 5CC     Culture  Setup Time       Value: 03/11/2014 17:13     Performed at [facility]   Culture       Value:        BLOOD CULTURE RECEIVED NO GROWTH TO DATE CULTURE WILL BE HELD FOR 5 DAYS BEFORE ISSUING A FINAL NEGATIVE REPORT     Performed at [facility]   Report Status PENDING    CBC WITH DIFFERENTIAL     Status: Abnormal   Collection Time    03/15/2014 11:19 AM      Result Value Ref Range   WBC 8.6  4.0 - 10.5 K/uL   RBC 3.61 (*) 3.87 - 5.11 MIL/uL   Hemoglobin 10.9 (*) 12.0 - 15.0  g/dL   HCT 37.0  36.0 - 46.0 %   MCV 102.5 (*) 78.0 - 100.0 fL   MCH 30.2  26.0 - 34.0 pg   MCHC 29.5 (*) 30.0 - 36.0 g/dL   RDW 14.2  11.5 - 15.5 %   Platelets 153  150 - 400 K/uL   Neutrophils Relative % 81 (*) 43 - 77 %   Neutro Abs 7.0  1.7 - 7.7 K/uL   Lymphocytes Relative 9 (*) 12 - 46 %   Lymphs Abs 0.8  0.7 - 4.0 K/uL   Monocytes Relative 10  3 - 12 %   Monocytes Absolute 0.8  0.1 - 1.0 K/uL   Eosinophils Relative 0  0 - 5 %   Eosinophils Absolute 0.0  0.0 - 0.7 K/uL   Basophils Relative 0  0 - 1 %   Basophils Absolute 0.0  0.0 - 0.1 K/uL  COMPREHENSIVE METABOLIC PANEL     Status: Abnormal   Collection Time    03/14/2014 11:19 AM      Result Value Ref Range   Sodium 134 (*) 137 - 147 mEq/L   Potassium 5.2  3.7 - 5.3 mEq/L   Chloride 95 (*) 96 - 112 mEq/L   CO2 33 (*) 19 - 32 mEq/L   Glucose, Bld 154 (*) 70 - 99 mg/dL   BUN 15  6 - 23 mg/dL   Creatinine, Ser 0.59  0.50 - 1.10 mg/dL   Calcium 10.0  8.4 - 10.5 mg/dL   Total Protein 6.8  6.0 - 8.3 g/dL   Albumin 3.3 (*) 3.5 - 5.2 g/dL   AST 23  0 - 37 U/L   ALT 16  0 - 35 U/L   Alkaline Phosphatase 80  39 - 117 U/L   Total  Bilirubin 0.4  0.3 - 1.2 mg/dL   GFR calc non Af Amer 84 (*) >90 mL/min   GFR calc Af Amer >90  >90 mL/min   Comment: (NOTE)     The eGFR has been calculated using the CKD EPI equation.     This calculation has not been validated in all clinical situations.     eGFR's persistently <90 mL/min signify possible Chronic Kidney     Disease.   Anion gap 6  5 - 15  I-STAT CG4 LACTIC ACID, ED     Status: None   Collection Time    02/28/2014 11:25 AM      Result Value Ref Range   Lactic Acid, Venous 0.85  0.5 - 2.2 mmol/L  URINE CULTURE     Status: None   Collection Time    03/16/2014 12:16 PM      Result Value Ref Range   Specimen Description URINE, CATHETERIZED     Special Requests NONE     Culture  Setup Time       Value: 02/23/2014 17:31     Performed at [facility]       Value: NO GROWTH     Performed at [facility]   Culture       Value: NO GROWTH     Performed at [facility]   Report Status 02/25/2014 FINAL    URINALYSIS, ROUTINE W REFLEX MICROSCOPIC     Status: Abnormal   Collection Time    03/24/2014 12:18 PM      Result Value Ref Range   Color, Urine AMBER (*) YELLOW   Comment: BIOCHEMICALS MAY BE  AFFECTED BY COLOR   APPearance CLEAR  CLEAR   Specific Gravity, Urine 1.017  1.005 - 1.030   pH 5.5  5.0 - 8.0   Glucose, UA 100 (*) NEGATIVE mg/dL   Hgb urine dipstick NEGATIVE  NEGATIVE   Bilirubin Urine SMALL (*) NEGATIVE   Ketones, ur NEGATIVE  NEGATIVE mg/dL   Protein, ur NEGATIVE  NEGATIVE mg/dL   Urobilinogen, UA 1.0  0.0 - 1.0 mg/dL   Nitrite NEGATIVE  NEGATIVE   Leukocytes, UA NEGATIVE  NEGATIVE   Comment: MICROSCOPIC NOT DONE ON URINES WITH NEGATIVE PROTEIN, BLOOD, LEUKOCYTES, NITRITE, OR GLUCOSE <1000 mg/dL.  Randolm Idol, ED     Status: None   Collection Time    03/06/2014  4:00 PM      Result Value Ref Range   Troponin i, poc 0.02  0.00 - 0.08 ng/mL   Comment 3            Comment: Due to the release kinetics of cTnI,      a negative result within the first hours     of the onset of symptoms does not rule out     myocardial infarction with certainty.     If myocardial infarction is still suspected,     repeat the test at appropriate intervals.  TROPONIN I     Status: None   Collection Time    03/19/2014  7:10 PM      Result Value Ref Range   Troponin I <0.30  <0.30 ng/mL   Comment:            Due to the release kinetics of cTnI,     a negative result within the first hours     of the onset of symptoms does not rule out     myocardial infarction with certainty.     If myocardial infarction is still suspected,     repeat the test at appropriate intervals.  PROCALCITONIN     Status: None   Collection Time    03/06/2014  7:10 PM      Result Value Ref Range   Procalcitonin <0.10     Comment:            Interpretation:     PCT (Procalcitonin) <= 0.5 ng/mL:     Systemic infection (sepsis) is not likely.     Local bacterial infection is possible.     (NOTE)             ICU PCT Algorithm               Non ICU PCT Algorithm        ----------------------------     ------------------------------             PCT < 0.25 ng/mL                 PCT < 0.1 ng/mL         Stopping of antibiotics            Stopping of antibiotics           strongly encouraged.               strongly encouraged.        ----------------------------     ------------------------------           PCT level decrease by               PCT < 0.25 ng/mL           >=  80% from peak PCT           OR PCT 0.25 - 0.5 ng/mL          Stopping of antibiotics                                                 encouraged.         Stopping of antibiotics               encouraged.        ----------------------------     ------------------------------           PCT level decrease by              PCT >= 0.25 ng/mL           < 80% from peak PCT            AND PCT >= 0.5 ng/mL            Continuing antibiotics                                                   encouraged.           Continuing antibiotics                encouraged.        ----------------------------     ------------------------------         PCT level increase compared          PCT > 0.5 ng/mL             with peak PCT AND              PCT >= 0.5 ng/mL             Escalation of antibiotics                                              strongly encouraged.          Escalation of antibiotics            strongly encouraged.  MRSA PCR SCREENING     Status: None   Collection Time    02/25/2014  7:36 PM      Result Value Ref Range   MRSA by PCR NEGATIVE  NEGATIVE   Comment:            The GeneXpert MRSA Assay (FDA     approved for NASAL specimens     only), is one component of a     comprehensive MRSA colonization     surveillance program. It is not     intended to diagnose MRSA     infection nor to guide or     monitor treatment for     MRSA infections.  BLOOD GAS, ARTERIAL     Status: Abnormal   Collection Time    03/03/2014  8:00 PM      Result Value Ref Range   FIO2 0.70     Delivery systems BILEVEL POSITIVE AIRWAY PRESSURE  Inspiratory PAP 12     Expiratory PAP 5     pH, Arterial 7.105 (*) 7.350 - 7.450   Comment: CRITICAL RESULT CALLED TO, READ BACK BY AND VERIFIED WITH:     BURLESON,D RRT AT 2012 ON 03/23/2014 BY DAY,J RRT   pCO2 arterial 103.0 (*) 35.0 - 45.0 mmHg   Comment: CRITICAL RESULT CALLED TO, READ BACK BY AND VERIFIED WITH:     BURLESON,D RRT AT 2012 ON 03/15/2014 BY DAY,J RRT   pO2, Arterial 90.7  80.0 - 100.0 mmHg   Bicarbonate 30.9 (*) 20.0 - 24.0 mEq/L   TCO2 34.1  0 - 100 mmol/L   Acid-Base Excess 1.9  0.0 - 2.0 mmol/L   O2 Saturation 95.6     Patient temperature 98.6     Collection site LEFT RADIAL     Drawn by [phone]     Sample type ARTERIAL     Allens test (pass/fail) PASS  PASS  BASIC METABOLIC PANEL     Status: Abnormal   Collection Time    02/25/14  1:16 AM      Result Value Ref Range   Sodium 133 (*) 137 - 147 mEq/L   Potassium 5.8 (*)  3.7 - 5.3 mEq/L   Chloride 96  96 - 112 mEq/L   CO2 27  19 - 32 mEq/L   Glucose, Bld 147 (*) 70 - 99 mg/dL   BUN 18  6 - 23 mg/dL   Creatinine, Ser 0.59  0.50 - 1.10 mg/dL   Calcium 9.9  8.4 - 10.5 mg/dL   GFR calc non Af Amer 84 (*) >90 mL/min   GFR calc Af Amer >90  >90 mL/min   Comment: (NOTE)     The eGFR has been calculated using the CKD EPI equation.     This calculation has not been validated in all clinical situations.     eGFR's persistently <90 mL/min signify possible Chronic Kidney     Disease.   Anion gap 10  5 - 15  CBC     Status: Abnormal   Collection Time    02/25/14  1:16 AM      Result Value Ref Range   WBC 9.2  4.0 - 10.5 K/uL   RBC 3.74 (*) 3.87 - 5.11 MIL/uL   Hemoglobin 11.2 (*) 12.0 - 15.0 g/dL   HCT 38.2  36.0 - 46.0 %   MCV 102.1 (*) 78.0 - 100.0 fL   MCH 29.9  26.0 - 34.0 pg   MCHC 29.3 (*) 30.0 - 36.0 g/dL   RDW 14.0  11.5 - 15.5 %   Platelets 149 (*) 150 - 400 K/uL  PROCALCITONIN     Status: None   Collection Time    02/25/14  1:16 AM      Result Value Ref Range   Procalcitonin <0.10     Comment:            Interpretation:     PCT (Procalcitonin) <= 0.5 ng/mL:     Systemic infection (sepsis) is not likely.     Local bacterial infection is possible.     (NOTE)             ICU PCT Algorithm               Non ICU PCT Algorithm        ----------------------------     ------------------------------             PCT <  0.25 ng/mL                 PCT < 0.1 ng/mL         Stopping of antibiotics            Stopping of antibiotics           strongly encouraged.               strongly encouraged.        ----------------------------     ------------------------------           PCT level decrease by               PCT < 0.25 ng/mL           >= 80% from peak PCT           OR PCT 0.25 - 0.5 ng/mL          Stopping of antibiotics                                                 encouraged.         Stopping of antibiotics               encouraged.         ----------------------------     ------------------------------           PCT level decrease by              PCT >= 0.25 ng/mL           < 80% from peak PCT            AND PCT >= 0.5 ng/mL            Continuing antibiotics                                                  encouraged.           Continuing antibiotics                encouraged.        ----------------------------     ------------------------------         PCT level increase compared          PCT > 0.5 ng/mL             with peak PCT AND              PCT >= 0.5 ng/mL             Escalation of antibiotics                                              strongly encouraged.          Escalation of antibiotics            strongly encouraged.  TROPONIN I     Status: None   Collection Time    02/25/14  1:17 AM      Result Value Ref Range   Troponin I <0.30  <0.30 ng/mL  Comment:            Due to the release kinetics of cTnI,     a negative result within the first hours     of the onset of symptoms does not rule out     myocardial infarction with certainty.     If myocardial infarction is still suspected,     repeat the test at appropriate intervals.  BASIC METABOLIC PANEL     Status: Abnormal   Collection Time    02/25/14  8:25 AM      Result Value Ref Range   Sodium 137  137 - 147 mEq/L   Potassium 5.8 (*) 3.7 - 5.3 mEq/L   Chloride 98  96 - 112 mEq/L   CO2 28  19 - 32 mEq/L   Glucose, Bld 134 (*) 70 - 99 mg/dL   BUN 20  6 - 23 mg/dL   Creatinine, Ser 0.61  0.50 - 1.10 mg/dL   Calcium 10.0  8.4 - 10.5 mg/dL   GFR calc non Af Amer 83 (*) >90 mL/min   GFR calc Af Amer >90  >90 mL/min   Comment: (NOTE)     The eGFR has been calculated using the CKD EPI equation.     This calculation has not been validated in all clinical situations.     eGFR's persistently <90 mL/min signify possible Chronic Kidney     Disease.   Anion gap 11  5 - 15  POCT I-STAT 3, ART BLOOD GAS (G3+)     Status: Abnormal   Collection Time     02/25/14  8:58 AM      Result Value Ref Range   pH, Arterial 7.338 (*) 7.350 - 7.450   pCO2 arterial 64.4 (*) 35.0 - 45.0 mmHg   pO2, Arterial 153.0 (*) 80.0 - 100.0 mmHg   Bicarbonate 34.9 (*) 20.0 - 24.0 mEq/L   TCO2 37  0 - 100 mmol/L   O2 Saturation 99.0     Acid-Base Excess 7.0 (*) 0.0 - 2.0 mmol/L   Patient temperature 97.0 F     Collection site RADIAL, ALLEN'S TEST ACCEPTABLE     Drawn by Operator     Sample type ARTERIAL     Comment NOTIFIED PHYSICIAN    TROPONIN I     Status: None   Collection Time    02/25/14  9:25 AM      Result Value Ref Range   Troponin I <0.30  <0.30 ng/mL   Comment:            Due to the release kinetics of cTnI,     a negative result within the first hours     of the onset of symptoms does not rule out     myocardial infarction with certainty.     If myocardial infarction is still suspected,     repeat the test at appropriate intervals.  BASIC METABOLIC PANEL     Status: Abnormal   Collection Time    02/25/14  8:27 PM      Result Value Ref Range   Sodium 138  137 - 147 mEq/L   Potassium 5.3  3.7 - 5.3 mEq/L   Chloride 99  96 - 112 mEq/L   CO2 31  19 - 32 mEq/L   Glucose, Bld 114 (*) 70 - 99 mg/dL   BUN 27 (*) 6 - 23 mg/dL   Creatinine, Ser 0.62  0.50 - 1.10 mg/dL   Calcium  9.8  8.4 - 10.5 mg/dL   GFR calc non Af Amer 82 (*) >90 mL/min   GFR calc Af Amer >90  >90 mL/min   Comment: (NOTE)     The eGFR has been calculated using the CKD EPI equation.     This calculation has not been validated in all clinical situations.     eGFR's persistently <90 mL/min signify possible Chronic Kidney     Disease.   Anion gap 8  5 - 15    Imaging / Studies: Ct Head Wo Contrast  03/04/2014   CLINICAL DATA:  Altered mental status.  EXAM: CT HEAD WITHOUT CONTRAST  TECHNIQUE: Contiguous axial images were obtained from the base of the skull through the vertex without intravenous contrast.  COMPARISON:  Brain MRI and head CT scans 02/02/2014.  FINDINGS:  Chronic microvascular ischemic change is again seen and unchanged in appearance. There is no evidence of acute intracranial abnormality including infarct, hemorrhage, mass lesion, mass effect, midline shift or abnormal extra-axial fluid collection. No hydrocephalus or pneumocephalus. The calvarium is intact.  IMPRESSION: No acute finding.  Stable compared to prior exams.   Electronically Signed   By: Inge Rise M.D.   On: 03/14/2014 14:32   Ct Angio Chest Pe W/cm &/or Wo Cm  03/18/2014   CLINICAL DATA:  The kidney a. Rule out pulmonary embolism. Right abdominal pain and possible sepsis.  EXAM: CT ANGIOGRAPHY CHEST  CT ABDOMEN AND PELVIS WITH CONTRAST  TECHNIQUE: Multidetector CT imaging of the chest was performed using the standard protocol during bolus administration of intravenous contrast. Multiplanar CT image reconstructions and MIPs were obtained to evaluate the vascular anatomy. Multidetector CT imaging of the abdomen and pelvis was performed using the standard protocol during bolus administration of intravenous contrast.  CONTRAST:  100 cc low osmolar iodinated contrast.  COMPARISON:  11/11/2013 abdominal CT.  06/05/2011 chest CT.  FINDINGS: CTA CHEST FINDINGS  THORACIC INLET/BODY WALL:  Asymmetric subcutaneous reticulation in the imaged right upper extremity presumably related to right mastectomy. No evidence of adenopathy.  Incidental laryngocele on the left, partially imaged.  MEDIASTINUM:  Cardiomegaly, especially of the atria. No pericardial effusion. Multi focal coronary atherosclerosis. No aortic dissection. No pulmonary embolism. No lymphadenopathy.  LUNG WINDOWS:  There is a bronchovascular opacity in the right upper lobe which is subpleural. Bronchial crowding consistent with volume loss. The appearance is similar 2012 and likely from radiation therapy given proximity to a right mastectomy. Near complete occlusion of the bronchus intermedius. Mottled internal density favors obstructing  mucous rather than mass. The right middle lobe bronchi are angulated and completely effaced, with lateral segment collapse predominantly. Interlobular septal thickening in multiple locations compatible with mild interstitial edema. Bronchial wall thickening likely related to the same. No definitive pneumonia. Trace right pleural effusion.  OSSEOUS:  Radiation changes to the upper right ribs. No acute osseous findings.  CT ABDOMEN and PELVIS FINDINGS  BODY WALL: Unremarkable.  LOWER CHEST: Unremarkable.  ABDOMEN/PELVIS:  Liver: Periportal edema which is presumably reactive. This likely obscures intrahepatic biliary ductal dilation which was present previously.  Biliary: Cholecystostomy tube terminates anterior and inferior to the right liver. Gallbladder is decompressed relative to previous CT, although the walls are not clearly defined. There is unexpected fluid around the liver and porta hepatis, with thin peritoneal enhancement, best seen in the coronal projection, inferior to the liver. No mature fluid collections suggestive of abscess. There is chronic extrahepatic biliary ductal enlargement.  Pancreas: Unremarkable.  Spleen: Mild  heterogeneous enhancement of the upper liver which appears vascular morphology. Ill-defined margins favors remote insult.  Adrenals: Low-density mass expanding the right adrenal gland consistent with adenoma, 18 mm in diameter.  Kidneys and ureters: No hydronephrosis or stone.  Bladder: Unremarkable.  Reproductive: Hysterectomy.  Bowel: Negative for bowel obstruction. Prominent colonic stool and gas without obstruction or definitive constipation. No evidence of bowel inflammation. Appendix not identified. No pericecal inflammation.  Retroperitoneum: No mass or adenopathy.  Peritoneum: No ascites or pneumoperitoneum.  Vascular: No acute abnormality.  OSSEOUS: No acute abnormalities. L4-5 and L5-S1 posterior fusion. Bone harvest site noted from the left pelvis.  Review of the MIP images  confirms the above findings.  IMPRESSION: 1. Negative for pulmonary embolism. 2. Non loculated fluid in the right upper quadrant. Correlate with percutaneous cholecystostomy tube output and consider tube injection to exclude catheter malpositioning or bile leak. 3. Near complete occlusion of the bronchus intermedius, likely from mucous plugging. Followup chest CT recommended after convalescence to exclude endoluminal mass. 4. Cardiomegaly and mild pulmonary edema. 5. Incidental findings noted above.   Electronically Signed   By: Jorje Guild M.D.   On: 03/09/2014 15:01   Ct Abdomen Pelvis W Contrast  03/12/2014   CLINICAL DATA:  The kidney a. Rule out pulmonary embolism. Right abdominal pain and possible sepsis.  EXAM: CT ANGIOGRAPHY CHEST  CT ABDOMEN AND PELVIS WITH CONTRAST  TECHNIQUE: Multidetector CT imaging of the chest was performed using the standard protocol during bolus administration of intravenous contrast. Multiplanar CT image reconstructions and MIPs were obtained to evaluate the vascular anatomy. Multidetector CT imaging of the abdomen and pelvis was performed using the standard protocol during bolus administration of intravenous contrast.  CONTRAST:  100 cc low osmolar iodinated contrast.  COMPARISON:  11/11/2013 abdominal CT.  06/05/2011 chest CT.  FINDINGS: CTA CHEST FINDINGS  THORACIC INLET/BODY WALL:  Asymmetric subcutaneous reticulation in the imaged right upper extremity presumably related to right mastectomy. No evidence of adenopathy.  Incidental laryngocele on the left, partially imaged.  MEDIASTINUM:  Cardiomegaly, especially of the atria. No pericardial effusion. Multi focal coronary atherosclerosis. No aortic dissection. No pulmonary embolism. No lymphadenopathy.  LUNG WINDOWS:  There is a bronchovascular opacity in the right upper lobe which is subpleural. Bronchial crowding consistent with volume loss. The appearance is similar 2012 and likely from radiation therapy given proximity  to a right mastectomy. Near complete occlusion of the bronchus intermedius. Mottled internal density favors obstructing mucous rather than mass. The right middle lobe bronchi are angulated and completely effaced, with lateral segment collapse predominantly. Interlobular septal thickening in multiple locations compatible with mild interstitial edema. Bronchial wall thickening likely related to the same. No definitive pneumonia. Trace right pleural effusion.  OSSEOUS:  Radiation changes to the upper right ribs. No acute osseous findings.  CT ABDOMEN and PELVIS FINDINGS  BODY WALL: Unremarkable.  LOWER CHEST: Unremarkable.  ABDOMEN/PELVIS:  Liver: Periportal edema which is presumably reactive. This likely obscures intrahepatic biliary ductal dilation which was present previously.  Biliary: Cholecystostomy tube terminates anterior and inferior to the right liver. Gallbladder is decompressed relative to previous CT, although the walls are not clearly defined. There is unexpected fluid around the liver and porta hepatis, with thin peritoneal enhancement, best seen in the coronal projection, inferior to the liver. No mature fluid collections suggestive of abscess. There is chronic extrahepatic biliary ductal enlargement.  Pancreas: Unremarkable.  Spleen: Mild heterogeneous enhancement of the upper liver which appears vascular morphology. Ill-defined margins favors remote  insult.  Adrenals: Low-density mass expanding the right adrenal gland consistent with adenoma, 18 mm in diameter.  Kidneys and ureters: No hydronephrosis or stone.  Bladder: Unremarkable.  Reproductive: Hysterectomy.  Bowel: Negative for bowel obstruction. Prominent colonic stool and gas without obstruction or definitive constipation. No evidence of bowel inflammation. Appendix not identified. No pericecal inflammation.  Retroperitoneum: No mass or adenopathy.  Peritoneum: No ascites or pneumoperitoneum.  Vascular: No acute abnormality.  OSSEOUS: No acute  abnormalities. L4-5 and L5-S1 posterior fusion. Bone harvest site noted from the left pelvis.  Review of the MIP images confirms the above findings.  IMPRESSION: 1. Negative for pulmonary embolism. 2. Non loculated fluid in the right upper quadrant. Correlate with percutaneous cholecystostomy tube output and consider tube injection to exclude catheter malpositioning or bile leak. 3. Near complete occlusion of the bronchus intermedius, likely from mucous plugging. Followup chest CT recommended after convalescence to exclude endoluminal mass. 4. Cardiomegaly and mild pulmonary edema. 5. Incidental findings noted above.   Electronically Signed   By: Jorje Guild M.D.   On: 03/16/2014 15:01   Dg Chest Port 1 View  02/25/2014   CLINICAL DATA:  RIGHT mucus plugging, followup, history shortness of breath, smoking, COPD, BiPAP, hypertension, GERD  EXAM: PORTABLE CHEST - 1 VIEW  COMPARISON:  Portable exam 0916 hr compared to 03/04/2014  FINDINGS: Enlargement of cardiac silhouette with pulmonary vascular congestion.  Question layered effusions at the lung bases bilaterally.  Bibasilar atelectasis versus infiltrate increased since previous exam.  Upper lungs clear.  No pneumothorax.  Bones demineralized.  IMPRESSION: Bibasilar atelectasis versus infiltrate with suspect small pleural effusions.   Electronically Signed   By: Lavonia Dana M.D.   On: 02/25/2014 10:01   Dg Chest Port 1 View  03/24/2014   CLINICAL DATA:  Sepsis with tachycardia and to kidney a and mental status change  EXAM: PORTABLE CHEST - 1 VIEW  COMPARISON:  PA and lateral chest of February 02, 2014  FINDINGS: The patient is rotated on this study. The lungs are hyperinflated. There is no focal infiltrate. There is minimal prominence of the interstitial markings which is not new. The cardiopericardial silhouette is top-normal in size. The pulmonary vascularity is not engorged. The bony thorax is unremarkable.  IMPRESSION: COPD without evidence of pneumonia  nor CHF. When the patient can tolerate the procedure, a PA and lateral chest x-ray would be useful.   Electronically Signed   By: David  Martinique   On: 03/07/2014 11:23    Medications / Allergies:  Scheduled Meds: . albuterol  2.5 mg Nebulization Q6H  . aspirin  325 mg Oral Daily  . enoxaparin (LOVENOX) injection  40 mg Subcutaneous Q24H  . nicotine  21 mg Transdermal Daily   Continuous Infusions: . sodium chloride 15 mL/hr at 02/25/14 2000  . morphine 1 mg/hr (02/25/14 2039)   PRN Meds:.albuterol, haloperidol lactate, LORazepam, morphine, ondansetron (ZOFRAN) IV, ondansetron  Antibiotics: Anti-infectives   Start     Dose/Rate Route Frequency Ordered Stop   02/25/14 0001  vancomycin (VANCOCIN) 500 mg in sodium chloride 0.9 % 100 mL IVPB  Status:  Discontinued     500 mg 100 mL/hr over 60 Minutes Intravenous Every 12 hours 03/16/2014 1110 02/25/14 1954   03/23/2014 2000  piperacillin-tazobactam (ZOSYN) IVPB 3.375 g  Status:  Discontinued     3.375 g 12.5 mL/hr over 240 Minutes Intravenous Every 8 hours 02/25/2014 1110 02/25/14 1954   03/08/2014 1100  piperacillin-tazobactam (ZOSYN) IVPB 3.375 g  3.375 g 100 mL/hr over 30 Minutes Intravenous  Once 03/03/2014 1055 02/28/2014 1518   03/04/2014 1100  vancomycin (VANCOCIN) IVPB 1000 mg/200 mL premix     1,000 mg 200 mL/hr over 60 Minutes Intravenous  Once 03/16/2014 1055 03/09/2014 1333      Assessment/Plan  Acute on chronic respiratory failure-on BiPAP  COPD  Tobacco abuse  Altered mental status  dCHF   S/p percutaneous cholecystostomy tube-will cancel IR drain injection.  No signs of cholecystitis, drain functioning.  Palliative care on board, now a DNR/comfort care.  I spoke with family at bedside.  Will sign off, please call [facility] with questions or concerns.  Erby Pian, [facility] Surgery Pager [phone]) For consults and floor pages call [phone]([id number])  03/07/2014 8:01 AM

## 2014-02-26 NOTE — ED Provider Notes (Signed)
Medical screening examination/treatment/procedure(s) were conducted as a shared visit with non-physician practitioner(s) and myself.  I personally evaluated the patient during the encounter.   EKG Interpretation   Date/Time:  Wednesday February 24 2014 10:46:05 EDT Ventricular Rate:  155 PR Interval:    QRS Duration: 102 QT Interval:  319 QTC Calculation: 512 R Axis:   -68 Text Interpretation:  Atrial fibrillation with rapid V-rate Paired  ventricular premature complexes Aberrant conduction of SV complex(es) LAD,  consider left anterior fascicular block LVH with secondary repolarization  abnormality Anterior Q waves, possibly due to LVH Baseline wander in  lead(s) I II aVR Confirmed by Debby Freiberg [phone]) on 03/10/2014 3:40:50  PM      I performed an examination on the patient including cardiac, pulmonary, and gi systems.  Pt intermittently hypoxic, but primarily able to oxygenate well on 2-3L O2, which is home amount.  Her history and exam were concerning for nonspecific respiratory failure and sepsis, and she was given broad spectrum abx.  The pt was placed on bipap for her respiratory failure and had some improvement in her work of breathing, but she continued to have significant respiratory acidosis.  CT CAP (immobilization, abd surgeries with biliary drain in place) did not reveal active source of condition.  Consulted surgery and pt admitted.    Debby Freiberg, MD Mar 08, 2014 [phone]

## 2014-02-26 NOTE — Progress Notes (Signed)
Report given to [facility]; pt's neighbor/caregiver at bedside and states she will call pt's son and grandson later this afternoon to make them aware of bed transfer; neighbor states they are sleeping at this time due to being in hospital all last night; pt transferred via bed by NT

## 2014-02-26 NOTE — Progress Notes (Signed)
Will sign off--

## 2014-03-02 LAB — CULTURE, BLOOD (ROUTINE X 2)
CULTURE: NO GROWTH
Culture: NO GROWTH

## 2014-03-03 NOTE — Discharge Summary (Addendum)
Death Summary  Coral L Daughtrey MRN:[id number] DOB: 07/13/1930 DOA: 03/13/2014  PCP: HUSAIN,KARRAR, MD  Admit date: 02/25/2014 Date of Death: 03/02/2014  Final Diagnoses:  Primary diagnosis:     Severe COPD complicated by mucous plugging, acute bronchitis, and progressive B pleural effusions with resultant severe acute exacerbation and ultimately hypoxic respiratory failure progressing to respiratory arrest and death   Other active issues at time of death:   Atrial fibrillation with RVR   Pulmonary edema   Hyperkalemia   Chronic combined systolic and diastolic CHF, NYHA class 1   Tobacco abuse   Fibromyalgia   Gallbladder gangrene s/p cholecystostomy tube   Acute on chronic respiratory failure with hypercapnia   Metabolic encephalopathy  History of present illness:  80-year-old female patient with multiple medical problems. Presented to the emergency department because of shortness of breath. Apparently had also been complaining of abdominal pain prior to arrival to the emergency department. EMS was called to the home where they found the patient to be tachycardic, tachypneic and dyspneic. Her heart rate was fluctuating between 107 and 170 beats per minute with respirations in the 30s. She was placed on 100% nonrebreather. Her baseline status was dementia with 24-hour caregivers. She had limited mobility with a cane walking only a few steps at a time. She was on chronic nasal cannula oxygen at 3 L and continues to smoke cigarettes.   Hospital Course:  After arrival to the emergency department she was given nebulizer treatments without any improvement in her respiratory symptoms. She was noted to be very diminished in the right lung region She had no leukocytosis. Chest x-ray was consistent with COPD without evidence of pneumonia or CHF. She was in atrial fibrillation with RVR. CT of the chest did not demonstrate PE. Because of ongoing dyspnea she required BiPAP. Subsequent CT of the chest was  concerning for mucous plug in the bronchus intermedius. Pulmonary medicine was consulted and evaluated the patient. Because of her tenuous respiratory status and risk for intubation bronchoscopy was deferred and conservative medical therapy with pulmonary toileting, supportive care and IV steroids was initiated. General surgery was also consulted regarding the patient's current cholecystostomy tube. She was given Cardizem IV for her atrial fibrillation with RVR.   After admission patient remained lethargic on the BiPAP including during initial morning rounds. By later in the morning patient became very alert, pulled off her BiPAP, was confused and very restless. Her usual home medications were resumed except for her Lyrica. It was clarified that her fentanyl patch had been discontinued at least one month prior. Followup chest x-ray was concerning for bilateral pulmonary edema with evolving pleural effusions. ABG revealed mildly persistent but greatly improved respiratory acidosis. Because of her agitation and no wheezing on exam her steroids were discontinued.   On 9/3 in the afternoon Palliative Medicine met with the family and given patient's progressive decline the focus was shifted from aggressive care to comfort measures. Patient was subsequently started on a low-dose morphine infusion with when necessary IV Haldol needed for any agitation. She was transitioned to a palliative care floor for end-of-life care, and died peacefully on 02/25/2014 at 22:50.  Signed:  , T  [facility] 03/03/2014, 7:39 PM      

## 2014-03-25 DEATH — deceased

## 2014-11-12 IMAGING — CR DG CHEST 1V PORT
2 series · 2 of 2 positions shown · non-contrast
Comparison: Portable exam 0710 hr compared to 02/24/2014

CLINICAL DATA: RIGHT mucus plugging, followup, history shortness of
breath, smoking, COPD, BiPAP, hypertension, GERD

EXAM:
PORTABLE CHEST - 1 VIEW

[AP (1 of 2)]
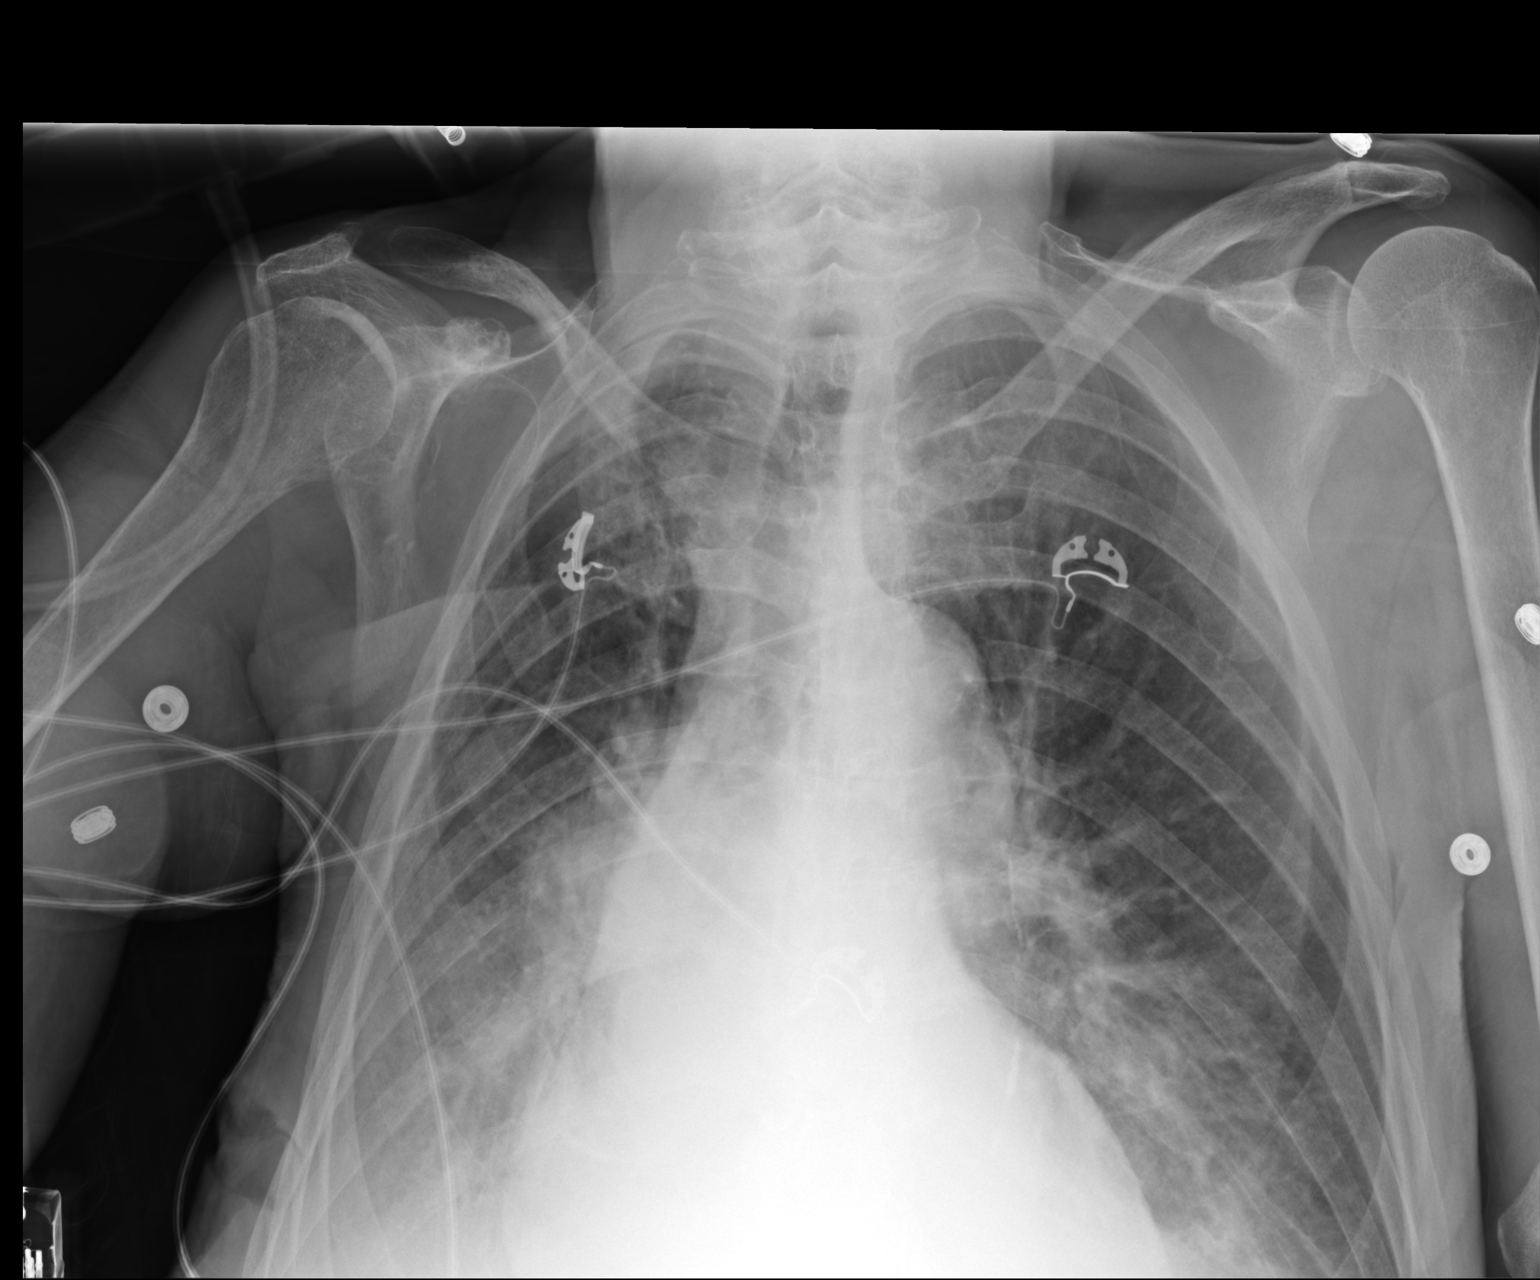

[AP (2 of 2)]
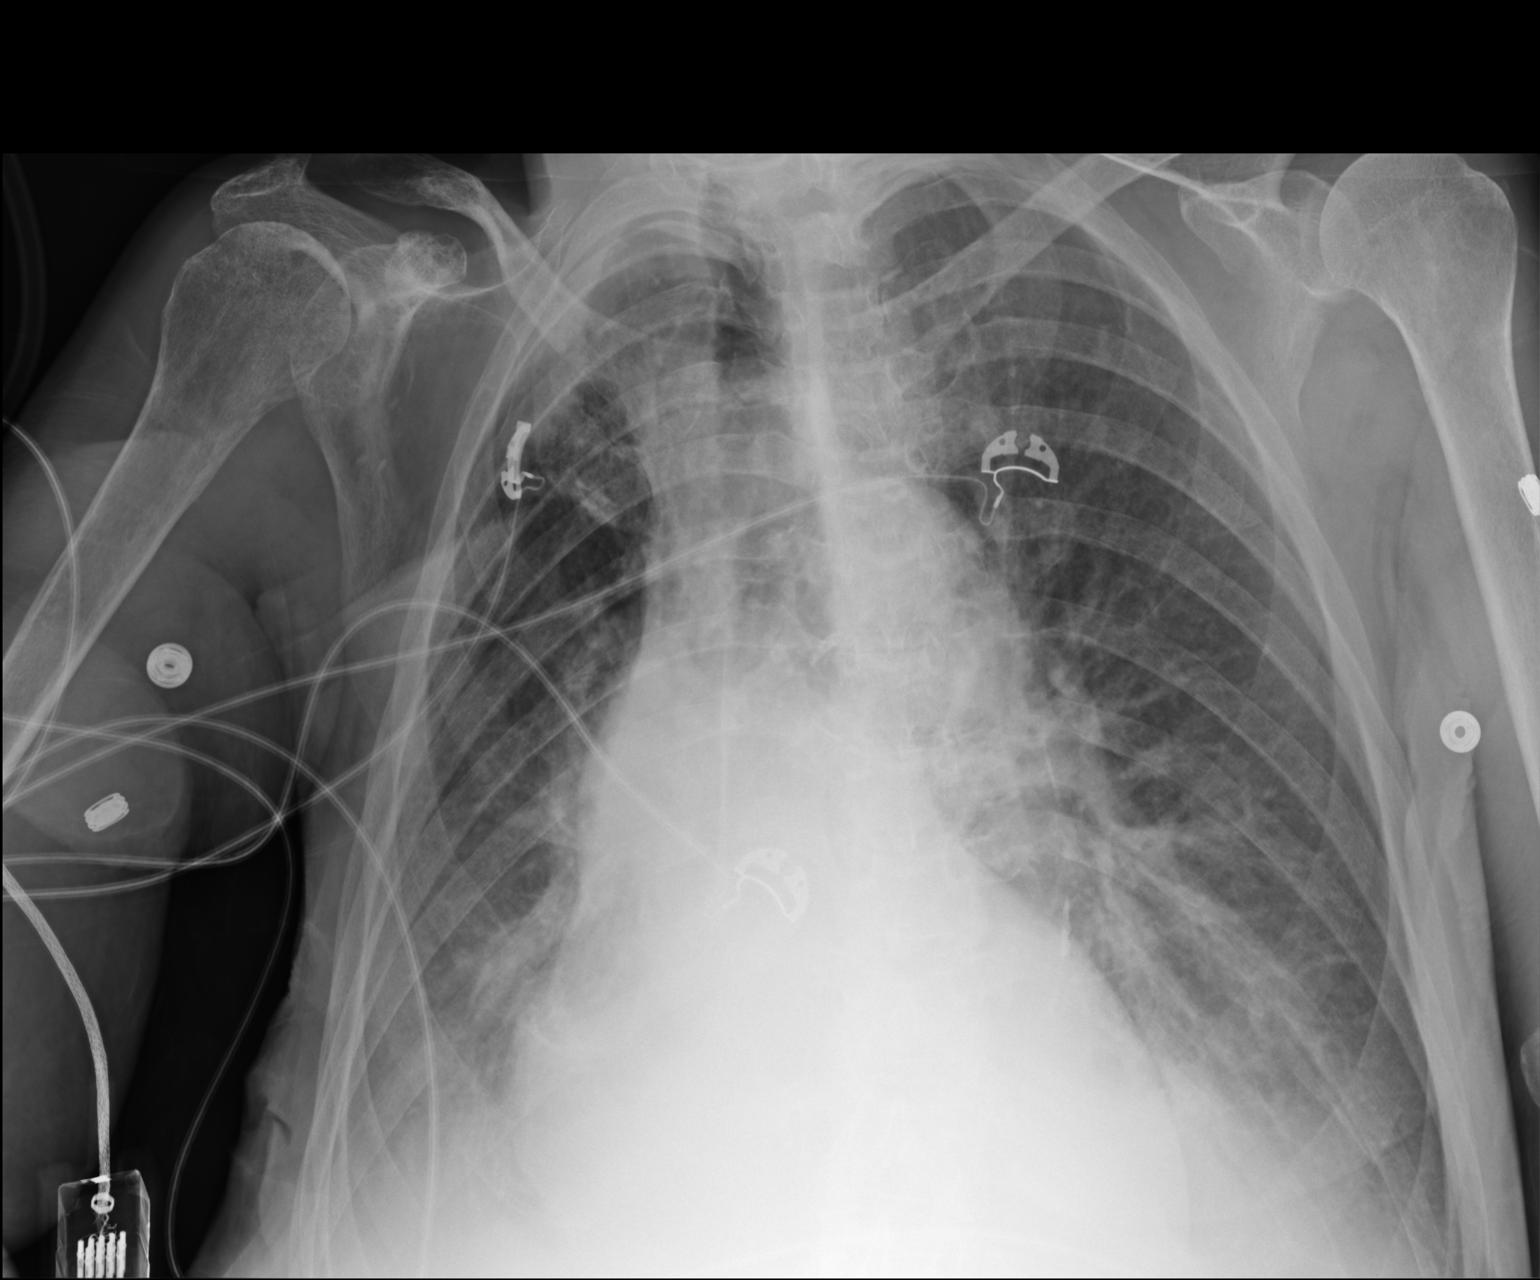

[2 of 2 positions shown; findings below may reference images not displayed]

FINDINGS: Enlargement of cardiac silhouette with pulmonary vascular
congestion.

Question layered effusions at the lung bases bilaterally.

Bibasilar atelectasis versus infiltrate increased since previous
exam.

Upper lungs clear.

No pneumothorax.

Bones demineralized.
IMPRESSION: Bibasilar atelectasis versus infiltrate with suspect small pleural
effusions.
